# Patient Record
Sex: Female | Born: 1955
Health system: Southern US, Community
[De-identification: ages and names within clinical notes are randomized; demographics above are authoritative.]

## PROBLEM LIST (undated history)

## (undated) DIAGNOSIS — R160 Hepatomegaly, not elsewhere classified: Secondary | ICD-10-CM

## (undated) DIAGNOSIS — Z9889 Other specified postprocedural states: Secondary | ICD-10-CM

## (undated) DIAGNOSIS — R112 Nausea with vomiting, unspecified: Secondary | ICD-10-CM

## (undated) DIAGNOSIS — Z86718 Personal history of other venous thrombosis and embolism: Secondary | ICD-10-CM

## (undated) HISTORY — DX: Personal history of other venous thrombosis and embolism: Z86.718

## (undated) HISTORY — PX: ABDOMINAL HYSTERECTOMY: SHX81

---

## 1998-03-04 ENCOUNTER — Emergency Department (HOSPITAL_COMMUNITY): Admission: EM | Admit: 1998-03-04 | Discharge: 1998-03-04 | Payer: Self-pay | Admitting: Emergency Medicine

## 1998-03-06 ENCOUNTER — Emergency Department (HOSPITAL_COMMUNITY): Admission: EM | Admit: 1998-03-06 | Discharge: 1998-03-06 | Payer: Self-pay | Admitting: Emergency Medicine

## 1999-10-03 ENCOUNTER — Encounter: Admission: RE | Admit: 1999-10-03 | Discharge: 1999-10-03 | Payer: Self-pay | Admitting: Urology

## 1999-10-03 ENCOUNTER — Encounter: Payer: Self-pay | Admitting: Urology

## 2000-07-23 ENCOUNTER — Other Ambulatory Visit: Admission: RE | Admit: 2000-07-23 | Discharge: 2000-07-23 | Payer: Self-pay | Admitting: Obstetrics and Gynecology

## 2004-02-25 ENCOUNTER — Encounter (INDEPENDENT_AMBULATORY_CARE_PROVIDER_SITE_OTHER): Payer: Self-pay | Admitting: Specialist

## 2004-02-25 ENCOUNTER — Inpatient Hospital Stay (HOSPITAL_COMMUNITY): Admission: RE | Admit: 2004-02-25 | Discharge: 2004-02-27 | Payer: Self-pay | Admitting: Obstetrics & Gynecology

## 2005-03-12 ENCOUNTER — Emergency Department (HOSPITAL_COMMUNITY): Admission: EM | Admit: 2005-03-12 | Discharge: 2005-03-13 | Payer: Self-pay | Admitting: Emergency Medicine

## 2005-03-25 ENCOUNTER — Emergency Department (HOSPITAL_COMMUNITY): Admission: EM | Admit: 2005-03-25 | Discharge: 2005-03-25 | Payer: Self-pay | Admitting: Emergency Medicine

## 2005-03-31 ENCOUNTER — Inpatient Hospital Stay (HOSPITAL_COMMUNITY): Admission: AD | Admit: 2005-03-31 | Discharge: 2005-04-05 | Payer: Self-pay | Admitting: Psychiatry

## 2005-03-31 ENCOUNTER — Ambulatory Visit: Payer: Self-pay | Admitting: Psychiatry

## 2005-05-01 ENCOUNTER — Other Ambulatory Visit: Admission: RE | Admit: 2005-05-01 | Discharge: 2005-05-01 | Payer: Self-pay | Admitting: Obstetrics & Gynecology

## 2005-05-21 ENCOUNTER — Inpatient Hospital Stay (HOSPITAL_COMMUNITY): Admission: AD | Admit: 2005-05-21 | Discharge: 2005-05-26 | Payer: Self-pay | Admitting: Psychiatry

## 2005-05-22 ENCOUNTER — Ambulatory Visit: Payer: Self-pay | Admitting: Psychiatry

## 2007-08-17 ENCOUNTER — Ambulatory Visit (HOSPITAL_COMMUNITY): Admission: RE | Admit: 2007-08-17 | Discharge: 2007-08-17 | Payer: Self-pay | Admitting: Urology

## 2008-05-04 ENCOUNTER — Ambulatory Visit (HOSPITAL_COMMUNITY): Admission: RE | Admit: 2008-05-04 | Discharge: 2008-05-04 | Payer: Self-pay | Admitting: Obstetrics & Gynecology

## 2008-08-30 ENCOUNTER — Emergency Department (HOSPITAL_COMMUNITY): Admission: EM | Admit: 2008-08-30 | Discharge: 2008-08-30 | Payer: Self-pay | Admitting: Emergency Medicine

## 2009-02-13 ENCOUNTER — Emergency Department (HOSPITAL_COMMUNITY): Admission: EM | Admit: 2009-02-13 | Discharge: 2009-02-13 | Payer: Self-pay | Admitting: Emergency Medicine

## 2009-02-14 ENCOUNTER — Emergency Department (HOSPITAL_COMMUNITY): Admission: EM | Admit: 2009-02-14 | Discharge: 2009-02-14 | Payer: Self-pay | Admitting: Emergency Medicine

## 2009-02-14 ENCOUNTER — Encounter (INDEPENDENT_AMBULATORY_CARE_PROVIDER_SITE_OTHER): Payer: Self-pay | Admitting: Emergency Medicine

## 2009-02-14 ENCOUNTER — Ambulatory Visit: Payer: Self-pay | Admitting: Vascular Surgery

## 2010-03-16 ENCOUNTER — Emergency Department (HOSPITAL_COMMUNITY): Admission: EM | Admit: 2010-03-16 | Discharge: 2010-03-17 | Payer: Self-pay | Admitting: Emergency Medicine

## 2010-09-18 LAB — CBC
Hemoglobin: 14.5 g/dL (ref 12.0–15.0)
Platelets: 204 10*3/uL (ref 150–400)
RBC: 4.61 MIL/uL (ref 3.87–5.11)
WBC: 7.8 10*3/uL (ref 4.0–10.5)

## 2010-09-18 LAB — DIFFERENTIAL
Basophils Absolute: 0.1 10*3/uL (ref 0.0–0.1)
Eosinophils Absolute: 0.1 10*3/uL (ref 0.0–0.7)
Eosinophils Relative: 1 % (ref 0–5)
Lymphocytes Relative: 33 % (ref 12–46)
Lymphs Abs: 2.6 10*3/uL (ref 0.7–4.0)
Monocytes Absolute: 0.3 10*3/uL (ref 0.1–1.0)

## 2010-09-18 LAB — LIPASE, BLOOD: Lipase: 47 U/L (ref 11–59)

## 2010-09-18 LAB — COMPREHENSIVE METABOLIC PANEL
ALT: 79 U/L — ABNORMAL HIGH (ref 0–35)
AST: 139 U/L — ABNORMAL HIGH (ref 0–37)
Albumin: 4.1 g/dL (ref 3.5–5.2)
CO2: 28 mEq/L (ref 19–32)
Chloride: 105 mEq/L (ref 96–112)
GFR calc Af Amer: >60 mL/min (ref >60)
GFR calc non Af Amer: >60 mL/min (ref >60)
Potassium: 3.3 mEq/L — ABNORMAL LOW (ref 3.5–5.1)
Sodium: 141 mEq/L (ref 135–145)
Total Bilirubin: 0.8 mg/dL (ref 0.3–1.2)

## 2010-09-18 LAB — POCT CARDIAC MARKERS
Myoglobin, poc: 44.2 ng/mL (ref 12–200)
Myoglobin, poc: 48.8 ng/mL (ref 12–200)
Troponin i, poc: <0.05 ng/mL (ref 0.00–0.09)

## 2010-10-12 LAB — POCT I-STAT, CHEM 8
BUN: 14 mg/dL (ref 6–23)
Creatinine, Ser: 0.8 mg/dL (ref 0.4–1.2)
Glucose, Bld: 107 mg/dL — ABNORMAL HIGH (ref 70–99)
Potassium: 3.8 mEq/L (ref 3.5–5.1)
Sodium: 140 mEq/L (ref 135–145)

## 2010-10-12 LAB — PROTIME-INR: Prothrombin Time: 14.1 seconds (ref 11.6–15.2)

## 2010-10-21 LAB — URINE MICROSCOPIC-ADD ON

## 2010-10-21 LAB — URINALYSIS, ROUTINE W REFLEX MICROSCOPIC
Bilirubin Urine: NEGATIVE
Glucose, UA: NEGATIVE mg/dL
Specific Gravity, Urine: 1.02 (ref 1.005–1.030)
pH: 6 (ref 5.0–8.0)

## 2010-10-21 LAB — URINE CULTURE

## 2010-11-18 NOTE — Consult Note (Signed)
NAME:  Cheryl [name], Cheryl [name] NO.:  [id number]   MEDICAL RECORD NO.:  [id number]          PATIENT TYPE:  EMS   LOCATION:  MAJO                         FACILITY:  [facility]   PHYSICIAN:  Renee Ramus, MD       DATE OF BIRTH:  05-28-1956   DATE OF CONSULTATION:  02/14/2009  DATE OF DISCHARGE:  02/14/2009                                 CONSULTATION   HISTORY OF PRESENT ILLNESS:  The patient is a 55 year old female who had  complaints of right lower extremity swelling, seen at [facility] and now returns today for Doppler study.  The patient had  Doppler study, which was positive for right DVT in the common femoral  extending to the popliteal.  The patient does have previous history of  lower extremity clot, asked to see the patient regarding this issue.   PAST MEDICAL HISTORY:  Right lower extremity DVT.   SOCIAL HISTORY:  The patient denies tobacco or alcohol use.   FAMILY HISTORY:  Not available.   REVIEW OF SYSTEMS:  All other comprehensive review of systems are  negative.   ALLERGIES:  The patient is allergic to [facility] and MACROBID.   CURRENT MEDICATIONS:  None.   PHYSICAL EXAMINATION:  GENERAL:  This is a well-developed, well-  nourished female, currently in no apparent distress.  VITAL SIGNS:  Blood pressure 117/82, heart rate 93, respiratory rate 18,  and temperature 98.4.  HEENT:  No jugular venous distention or lymphadenopathy.  Oropharynx is  clear.  Mucous membranes pink and moist.  TMs clear bilaterally.  Pupils  equal, reactive to light and accommodation.  Extraocular muscles are  intact.  CARDIOVASCULAR:  Regular rate and rhythm without murmurs, rubs, or  gallops.  PULMONARY:  Lungs are clear to auscultation bilaterally.  ABDOMEN:  Soft, nontender, nondistended without hepatosplenomegaly.  Bowel sounds are present.  She has no rebound or guarding.  EXTREMITIES:  She has a swollen right lower extremity with good distal  pulses.  NEUROLOGIC:   Cranial nerves II through XII are grossly intact.  She has  no focal neurological deficits.   LABORATORY DATA:  INR is 1.1.  Currently, she has hemoglobin of 11.6,  hematocrit 34, sodium 140, potassium 3.8, chloride 1.6, bicarb 23, BUN  14, creatinine 0.8, glucose 107.   STUDIES:  Ultrasound shows right DVT.   ASSESSMENT AND PLAN:  Right lower extremity deep venous thrombosis.  We  start anticoagulation with Lovenox and Coumadin.  The patient has an  appointment on Monday August 16 to have a PT/INR check for obtaining a  compassionate relief for these medications.  The patient will follow up  with primary care physician as needed.   Consult note was constructed by reviewing past medical  history  conferring with emergency medical room physician and reviewing the  emergency medical record.   TIME SPENT:  One hour.      Renee Ramus, MD  Electronically Signed     JF/MEDQ  D:  02/14/2009  T:  02/14/2009  Job:  [id number]   cc:   Soyla Murphy. Renne Crigler,  M.D. 

## 2010-11-21 NOTE — Discharge Summary (Signed)
Cheryl [name], FUNDERBURKE NO.:  [id number]   MEDICAL RECORD NO.:  [id number]          PATIENT TYPE:  IPS   LOCATION:  0405                          [facility]ILITY[facility]   PHYSICIAN:  Jeanice Lim, M.D. DATE OF BIRTH:  1955/07/10   DATE OF ADMISSION:  03/31/2005  DATE OF DISCHARGE:  04/05/2005                                 DISCHARGE SUMMARY   IDENTIFYING DATA:  This is a 55 year old Caucasian female, involuntarily  admitted, referred by [facility] after husband petitioned  on patient not eating or drinking for 3 days, manic.  Went to mental health  center, took out a petition on wife feeling she is unsafe due to self  neglect.  The patient not eating or sleeping, called 911 each night for the  past 3 nights, and was driving around, picked up a stranger in her car,  drinking alcohol, disheveled and very poor hygiene with stool on her  clothing and matted hair.  Past history of clear bipolar disorder, treated  by Dr. Evelene Croon about 6 years ago, saw her 2003.  The patient reports mood  episode worsened after mother's death.  Recently started trying to see Dr.  Evelene Croon probably upon husband's request and did not fully understand why she  needs to be here and has some paranoid thinking and grandiose delusions  regarding business and husband's intent.  Does trust daughter although  reports that she also does not understand how sick the father is.  Mother  has bipolar and alcohol, aunt has bipolar and possible history of  schizophrenia.  Alcohol dependence runs in the family.  No primary care  physician.  Medications:  Not taking any and again alcohol use though the  patient minimizing this but likely drinking every night, with a recent  increase in use since mood instability according to husband, has had more  significant alcohol problems, longstanding, but the patient's alcohol  problem likely gets worse when her mood is unstable, attempting to self   medicate.   PHYSICAL AND NEUROLOGICAL EXAMINATION:  Within normal limits.   ROUTINE ADMISSION LABS:  Within normal limits.   MENTAL STATUS EXAM:  Fully alert, bright affect, quick movements,  cooperative but impulsive, needs redirection, rapid, hyperverbal, rambling  at times, difficult to understand.  Mood elevated, minimizing alcohol use,  positive flight of ideas, distracted.  Believes husband is inattentive and  may harm her and lock her in jail.  Tangential at times, almost threatening  due to mood elevation, agitation, and wanting what she wants immediately.  Cognitively intact but unreliable historian with impaired recollection of  events and impaired concentration and attention and recent memory.   ADMISSION DIAGNOSES:  AXIS I:  Bipolar disorder I, manic, with psychotic  features, recurrent, severe and rule out alcohol dependence, alcohol abuse  at least.  AXIS II:  None.  AXIS III:  Hypokalemia and elevated blood pressure, rule out hypertension.  AXIS IV:  Moderate to severe stressors with marital conflict and likely  longstanding bipolar disorder not fully treated and recent losses.  Also  possible inheritance which  may have given money to fuel mood instability.  AXIS V:  28/60-65.   HOSPITAL COURSE:  The patient was admitted involuntarily, monitored for  safety regarding suicidalality, transferred to Dr. Broadus John services.  Had  been recently seen by Dr. Evelene Croon.  Dr. Evelene Croon was contacted for treatment plan  and further information and family after the patient was agreeable and  signed release contacted for collateral information.  The patient was  monitored for safety, optimized on medications, educated regarding severity  of her bipolar disorder and as she improved with a few nights of sleep.  Librium detox protocol was stopped.  The patient reported significant  improvement in judgment and insight, no longer thought the husband was  against her and was willing to have a  family meeting, and was focused on  being compliant, remaining alcohol free and getting herself healthy.  The  patient was discharged home to family, had a good support system, no  dangerous ideation or mood instability or psychotic symptoms, at time of  discharge was sleeping and eating well and had no problem with ADLs or  ability to care for self, was given medication education, again the  importance of long-term compliance due to the severity of her mood disorder  in addition to abstaining from alcohol, seeking bipolar support group, AA,  therapy and medication monitoring were all recommended.  The patient was  given medication education and discharged on:  1.  Ambien 10 mg q.h.s.  2.  Depakote ER 250 mg q.a.m. and 3 q.8 p.m.  3.  Risperdal 0.5 mg q.8 p.m.   DISPOSITION:  The patient was to follow up Friday, October 6 and Wednesday  October 4 at 10:15.   DISCHARGE DIAGNOSES:  AXIS I:  Bipolar disorder I, manic, with psychotic  features, recurrent, severe and rule out alcohol dependence, alcohol abuse  at least.  AXIS II:  None.  AXIS III:  Hypokalemia and elevated blood pressure, rule out hypertension.  AXIS IV:  Moderate to severe stressors with marital conflict and likely  longstanding bipolar disorder not fully treated and recent losses.  Also  possible inheritance which may have given money to fuel mood instability.  AXIS V:  Global assessment of function on discharge was 55.      Jeanice Lim, M.D.  Electronically Signed     JEM/MEDQ  D:  05/06/2005  T:  05/07/2005  Job:  [id number]

## 2010-11-21 NOTE — Op Note (Signed)
NAME:  Cheryl [name], Cheryl [name]                          ACCOUNT NO.:  [id number]   MEDICAL RECORD NO.:  [id number]                   PATIENT TYPE:  INP   LOCATION:  9399                                 FACILITY:  [facility]   PHYSICIAN:  Freddy Finner, M.D.                DATE OF BIRTH:  12-18-1955   DATE OF PROCEDURE:  02/25/2004  DATE OF DISCHARGE:                                 OPERATIVE REPORT   PREOPERATIVE DIAGNOSES:  1. Uterine fibroids.  2. Small lateral ovarian cyst.   POSTOPERATIVE DIAGNOSES:  1. Uterine fibroids.  2. Small lateral ovarian cyst.   PROCEDURE:  Total abdominal hysterectomy with bilateral salpingo-  oophorectomy.  Uterine size was 470 g.   SURGEON:  Freddy Finner, M.D.   ASSISTANT:  Stann Mainland. Vincente Poli, M.D.   ANESTHESIA:  General endotracheal anesthesia.   ESTIMATED BLOOD LOSS:  200 mL.   COMPLICATIONS:  None.   DESCRIPTION OF PROCEDURE:  The patient was admitted on the morning of  surgery.  She was given subcutaneous heparin because of an episode of  phlebitis following previous surgical procedure.  She was given IV  antibiotics.  She was placed in PAS hose.  She was brought to the operating  room, placed under adequate general anesthesia, and placed in the dorsal  recumbent position.  Abdomen, perineum and vagina were prepped and draped in  the usual fashion with Betadine scrub followed by Betadine solution.  Foley  catheter was placed using sterile technique.  Sterile drapes were applied.  A low abdominal transverse incision was made through a low transverse scar  and carried sharply down to the fascia.  Fascia was extended sharply in the  extended to extend the skin incision.  Bleeding subcutaneous and subfascial  vessels were controlled with Bovie.  Rectus sheath was developed superiorly  and inferiorly, blunt and sharp dissection.  Rectus muscles were divided in  the midline.  Peritoneum was elevated, entered sharply and extended bluntly  to extend the  skin incision.  Exploration of the upper abdomen revealed no  palpable abnormalities of liver, gallbladder or kidneys. Self-retaining  O'Connor-O'Sullivan retractor was placed.  Moist packs were used to pack the  entire stomach contents out of the pelvis.  Proximal broad ligaments were  grasped with Kelleys.  Using the LigaSure system, the infundibulopelvic  ligament on the right was sealed and divided.  Round ligament was sealed and  divided.  Upper broad ligament was sealed and divided.  Left side was then  treated in a similar fashion.  There were a few filmy adhesions holding the  fallopian tube to the peritoneal surface that were lysed and released.  Bladder was released with a transverse incision and the visceral peritoneum  overlying the lower uterine segment and anterior broad ligament.  Bladder  was carefully dissected off the bladder.  Uterine artery pedicles were taken  with the LigaSure, sealed and  divided.  Cardinal ligament pedicles were  taken with LigaSure, sealed and divided.  Uterosacral ligaments were taken  with curved Heaneys divided sharply and ligated with suture ligature of 0  Monocryl.  This included the angle of the vagina on each side.  Small  bleeding source near the left uterosacral was controlled with a figure-of-  eight of 0 Monocryl.  The cervix was excised from the cuff.  Cuff was closed  with figure-of-eights of 0 Monocryl.  Irrigation was carried out.  Hemostasis was complete.  Uterosacral ligaments were plicated.  Peritoneum  anterior of the bladder flap was controlled with a single interrupted 0  Monocryl suture.  Pack, needle and instrument counts were correct.  Appendix  was visualized and found to be retrocecal but very small and grossly normal  in appearance and was left in place.  Abdominal incision was then closed in  layers.  Running 0 Monocryl was used to close peritoneum and reapproximate  rectus muscles.  Fascia was closed with running 0 PDS.   Skin was closed with  wide skin staples and covered with Steri-Strips.  The patient was awakened  and taken to the recovery room in good condition.                                               Freddy Finner, M.D.    WRN/MEDQ  D:  02/25/2004  T:  02/25/2004  Job:  [id number]

## 2010-11-21 NOTE — H&P (Signed)
NAME:  Cheryl [name], Cheryl [name]                          ACCOUNT NO.:  [id number]   MEDICAL RECORD NO.:  [id number]                   PATIENT TYPE:  INP   LOCATION:  NA                                   [facility]ILITY[facility]  [facility]   PHYSICIAN:  Freddy Finner, M.D.                DATE OF BIRTH:  04-Feb-1956   DATE OF ADMISSION:  DATE OF DISCHARGE:                                HISTORY & PHYSICAL   PREOPERATIVE DIAGNOSES:  1. Large uterine fibroids.  2. Bilateral ovarian cyst.   HISTORY AND PHYSICAL:  The patient is a 55 year old white married female  with one living child delivered in 53 by cesarean.  Postoperatively, she  had phlebitis.  She presented to the office in March of this year with a  history of having had a urological evaluation and had a finding of very  large uterine fibroids.  Examination in my office revealed the uterus to be  12-14 weeks' gestational size and irregularly nodular.  Subsequent pelvic  ultrasound was performed with the finding of at least 4-5 fibroids, the  largest of which was approximately 6.5 cm and bilateral simple ovarian cyst.  After careful consultation, the patient has elected to proceed with  definitive surgery, specifically total abdominal hysterectomy.  Because of  her age and the presence of an ovarian cyst, she will also have bilateral  salpingo-oophorectomy.  She has reviewed the video in the office describing  the operative procedure including the potential risks of the procedure.   REVIEW OF SYSTEMS:  Her current review of systems is otherwise negative.  She has no cardiopulmonary, GI or GU complaints.   PAST MEDICAL HISTORY:  She has no known significant medical illnesses except  the episode of phlebitis following cesarean.  She has never had a blood  transfusion.  She had a history of a normal Pap smear in July 2004.   MEDICATIONS:  She is currently on no medications on a regular basis.   ALLERGIES:  She is allergic to sulfa drugs and Macrodantin.   .   SOCIAL HISTORY:  She does not use cigarettes.   FAMILY HISTORY:  Noncontributory.   PHYSICAL EXAMINATION:  VITAL SIGNS:  Blood pressure in the office was  120/82, weight 143.  HEENT:  Grossly within normal limits.  NECK:  There is no palpable enlargement of the thyroid.  CHEST:  Clear to auscultation throughout.  HEART:  Normal sinus rhythm without murmurs, rubs or gallops.  ABDOMEN:  Soft and nontender.  There is a palpable mass just above the  symphysis.  There is no other appreciable organomegaly or palpable masses.  EXTREMITIES:  Without cyanosis, clubbing or edema.   ASSESSMENT:  1. Large uterine leiomyomata.  2. Bilateral ovarian cysts.   PLAN:  Total abdominal hysterectomy, bilateral salpingo-oophorectomy.  The  patient will be given subcutaneous heparin as prophylaxis because of her  history of phlebitis.  She will  be treated preoperatively with antibiotic.  Hormone replacement therapy will with a skin patch because of the history of  phlebitis.                                               Freddy Finner, M.D.    WRN/MEDQ  D:  02/24/2004  T:  02/24/2004  Job:  [id number]

## 2010-11-21 NOTE — Discharge Summary (Signed)
[name], [name] NO.:  [id number]   MEDICAL RECORD NO.:  [id number]          PATIENT TYPE:  IPS   LOCATION:  0406                          [facility]   PHYSICIAN:  Cheryl Jungling, MD  DATE OF BIRTH:  28-Sep-1955   DATE OF ADMISSION:  05/21/2005  DATE OF DISCHARGE:  05/26/2005                                 DISCHARGE SUMMARY   IDENTIFYING DATA AND REASON FOR ADMISSION:  The patient is a 55 year old  married white female who was admitted on an involuntary basis due to  increasing agitation, noncompliance with her medication regimen, excessive  alcohol consumption that resulted in severe behavioral disorganization,  walking the streets. Her mood and bearing was also noted to be hostile and  aggressive. She came to [place] as a patient of Dr. Evelene [name], her outpatient  psychiatrist. She had previously been here in our inpatient service in  September of 2006. Her usual medication regimen had been Depakote 500  milligrams q.h.s. and Zoloft 50 milligrams daily. Please refer to the  admission note for further details pertaining to the symptoms, circumstances  and history that led to her hospitalization. She was given an initial Axis I  diagnosis of bipolar disorder NOS.   MEDICAL AND LABORATORY:  The patient came to [place] with a history of  hypertension, and allergy to sulfa, but otherwise in good health. She was  physically and medically assessed by the nurse practitioner upon the  beginning of her treatment.   HOSPITAL COURSE:  The patient was admitted to the adult inpatient service  where she initially was quite agitated, irritable, and verbally aggressive  towards staff. She was given Haldol 10 milligrams IM and Ativan 2 milligrams  IM with very good results. She was started on a regimen of Depakote 500  milligrams q.h.s., Zoloft 50 milligrams q.h.s., Risperdal 0.5 milligrams q.  8 p.m., and to address possible alcohol cessation symptoms, Librium 50  milligrams p.o.  q.h.s. followed by Librium 25 milligrams p.o. q.6 h p.r.n.  signs and symptoms of withdrawal.   By the second hospital day the patient was much more calm, relaxed, and  subdued. She understood where she was and the reasons for treatment. Her  bedtime dose of Risperdal was increased to 2 milligrams.   On the second hospital day she was discovered to have relatively low serum  potassium, so 40 mEq of potassium was given immediately, followed by 40 mEq  daily x3. She was also encouraged to drink Gatorade.   The patient continued to stabilize with respect to mood, and thought  organization.   There were several attempts to schedule a family meeting involving the  patient's husband, but he was not reachable by telephone despite numerous  attempts by her case manager to get a hold of him.   On the sixth hospital day, the patient was calm, pleasant, cooperative, and  absent any signs or symptoms of alcohol withdrawal. She was felt to be ready  for discharge.   AFTERCARE:  The patient was to follow-up with Dr. Evelene [name] on 06/06/2005, and  with her therapist,  Cheryl [name] on 05/27/2005.   DISCHARGE MEDICATIONS:  Zoloft 50 milligrams daily, Risperdal 2 milligrams  q.h.s., and Depakote 750 milligrams q.h.s..   DISCHARGE DIAGNOSES:  AXIS I: Bipolar disorder, most recently manic, history  of alcohol abuse, dependence, early remission.  AXIS II: Deferred.  AXIS III: Allergy to sulfa, and hypertension.  AXIS IV: Stressors severe.  AXIS V: GAF on discharge 65.   It is anticipated that Dr. Evelene [name] and Cheryl [name] will  address the patient's  alcohol abuse and dependence issues further an outpatient treatment and  perhaps make appropriate referrals to the chemical dependency program.           ______________________________  Cheryl Jungling, MD  Electronically Signed     SPB/MEDQ  D:  05/26/2005  T:  05/26/2005  Job:  [id number]

## 2010-11-21 NOTE — Discharge Summary (Signed)
NAME:  Cheryl [name], Cheryl [name]                          ACCOUNT NO.:  [id number]   MEDICAL RECORD NO.:  [id number]                   PATIENT TYPE:  INP   LOCATION:  9310                                 [facility]ILITY[facility]   PHYSICIAN:  Freddy Finner, M.D.                DATE OF BIRTH:  1955/11/18   DATE OF ADMISSION:  02/25/2004  DATE OF DISCHARGE:  02/27/2004                                 DISCHARGE SUMMARY   DISCHARGE DIAGNOSES:  1.  Large uterine leiomyomata with uterine weight of 418 g.  2.  Benign follicular ovarian cyst.   SECONDARY DIAGNOSIS:  History of phlebitis following surgical delivery in  1985.   OPERATIVE PROCEDURE:  Total abdominal hysterectomy, bilateral salpingo-  oophorectomy.   INTRAOPERATIVE AND POSTOPERATIVE COMPLICATIONS:  None.   DISPOSITION:  The patient is in satisfactory and improved condition at the  time of her discharge on postoperative day #2.  The abdominal incision was  clear.  There was scant vaginal drainage.  She is ambulating without  difficulty, having adequate bowel and bladder function.  She is discharged  home with routine outpatient surgical instructions including those to call  for a fever or heavy bleeding.  She is to have progressive increasing  activity but no vaginal entry.  She is to take Percocet 5/325 for  postoperative pain which can be mixed with ibuprofen.  She is to use  transdermal estrogen replacement.   Details of the present illness, past history, family history, review of  systems, and physical exam were recorded in the admission note.  Her  physical findings were remarkable for a very large uterus measuring  approximately 12- to 14-weeks gestational size and irregularly nodular.   Her past history is most remarkable for an episode of phlebitis.   Laboratory data during this admission includes a CBC on admission with  hemoglobin of 15.7.  Postoperatively her hemoglobin was 13.2 on  postoperative day #1 and 13.1 on  postoperative  day #2.  Her admission  prothrombin time and PTT were normal.  Urinalysis was unremarkable.   HOSPITAL COURSE:  Following admission the patient was taken to the operating  room where the above-described surgical procedure was accomplished.  She was  treated perioperatively with subcutaneous heparin q.8h.  She was given an IV  antibiotic preoperatively.  She was placed in compression hose.  Her  postoperative course was uncomplicated.  She remained afebrile throughout.  Her heparin was continued for a period of approximately 36 hours after  surgery until she was ambulating normally.  By the time of her discharge she  was considered to be in satisfactory condition.  Further disposition as  noted above.  Freddy Finner, M.D.    WRN/MEDQ  D:  03/16/2004  T:  03/17/2004  Job:  [id number]

## 2012-07-22 ENCOUNTER — Other Ambulatory Visit: Payer: Self-pay | Admitting: Dermatology

## 2015-04-23 ENCOUNTER — Other Ambulatory Visit: Payer: Self-pay | Admitting: Dermatology

## 2015-04-23 ENCOUNTER — Ambulatory Visit
Admission: RE | Admit: 2015-04-23 | Discharge: 2015-04-23 | Disposition: A | Discharge status: Home / Self Care | Payer: Self-pay | Source: Ambulatory Visit | Attending: Dermatology | Admitting: Dermatology

## 2015-04-23 DIAGNOSIS — L602 Onychogryphosis: Secondary | ICD-10-CM

## 2015-08-27 ENCOUNTER — Ambulatory Visit (INDEPENDENT_AMBULATORY_CARE_PROVIDER_SITE_OTHER): Payer: BLUE CROSS/BLUE SHIELD | Admitting: Podiatry

## 2015-08-27 ENCOUNTER — Encounter: Payer: Self-pay | Admitting: Podiatry

## 2015-08-27 VITALS — BP 121/83 | HR 75 | Resp 16 | Ht 68.0 in | Wt 150.0 lb

## 2015-08-27 DIAGNOSIS — L603 Nail dystrophy: Secondary | ICD-10-CM

## 2015-08-27 NOTE — Progress Notes (Signed)
   Subjective:    Patient ID: Cheryl [name], female    DOB: [birth year], 60 y.o.   MRN: [id number]  HPI: She presents today with a chief complaint of a thick discolored painful hallux nail left. She states that this is only the nail that bothers her. She states that she has seen her dermatologist on several occasions who has tested the toenail and determined the infection to be yeast. She has taken Lamisil in the past and most recently Diflucan over the past couple of years. She states that the toenail barely grows and all the rest of them grow vigorously. She would like to know if there is anything she can do for the toenail.    Review of Systems  All other systems reviewed and are negative.      Objective:   Physical Exam: Vital signs are stable she is alert and oriented 61. 60 year old white female in no apparent distress. I have reviewed her past medical history medications allergies surgeries social history and review of systems I have also reviewed her paperwork as well as notes sent from [facility] dermatology. At this point pulses are strongly palpable neurologic sensorium is intact deep tendon reflexes are intact and muscle strength is 5 over 5 dorsiflexion plantar flexors and inverters everters all intrinsic musculature is intact. Orthopedic evaluation and stress all joints distal to the ankle for range of motion without crepitation. Cutaneous evaluation of straight supple well-hydrated cutis no erythema edema cellulitis drainage or odor. She has a very thick dystrophic nail it appears to grow straight up from the lunula and then straight back down with curvature plantarly and laterally. The entire nail appears to be thick and brittle with some subungual debris it appears that there may be fungus or yeast present but this is just definitely a dystrophic nail.      Assessment & Plan:  Assessment: Dystrophic nail hallux left.  Plan: I provided her with 3 options: #1 to do nothing. #2  perform a total nail avulsion and allow it to grow back. And #3 a total nail avulsion with matrixectomy to prevent it from growing back. I explained to her that she would have to undergo local anesthesia to the toe and I also explained how long she would have to soak the toe in care for it. She states that she would like to make an appointment to follow-up with me in a week or so after she has time to think about it and we will do it at that time. I will follow-up with her in the near future

## 2015-09-16 ENCOUNTER — Telehealth: Payer: Self-pay | Admitting: *Deleted

## 2015-09-16 NOTE — Telephone Encounter (Signed)
Pt states she is scheduled 09/19/2015 to have a toenail removed, and was wondering if she could wear a shoe and go to work afterward.  I told pt she should wear either a loose shoe or open-toed shoes after the procedure and she may go back to work.

## 2015-09-19 ENCOUNTER — Encounter: Payer: Self-pay | Admitting: Podiatry

## 2015-09-19 ENCOUNTER — Ambulatory Visit (INDEPENDENT_AMBULATORY_CARE_PROVIDER_SITE_OTHER): Payer: BLUE CROSS/BLUE SHIELD | Admitting: Podiatry

## 2015-09-19 VITALS — BP 157/98 | HR 72 | Resp 12

## 2015-09-19 DIAGNOSIS — L603 Nail dystrophy: Secondary | ICD-10-CM | POA: Diagnosis not present

## 2015-09-19 DIAGNOSIS — L600 Ingrowing nail: Secondary | ICD-10-CM | POA: Diagnosis not present

## 2015-09-19 MED ORDER — NEOMYCIN-POLYMYXIN-HC 3.5-10000-1 OT SOLN: OTIC | Status: DC

## 2015-09-19 NOTE — Patient Instructions (Signed)

## 2015-09-19 NOTE — Progress Notes (Signed)
She presents today to have her toenail removed hallux left. We talked about this last time and she would like to have it just avulsed but not permanently removed.  Objective: Vital signs are stable she is alert and oriented 3 pulses are palpable. I reviewed her medications and allergies. Discussed this with her. Her toenail is thick yellow dystrophic painful palpation.  Assessment: Nail dystrophy painful hallux left.  Plan: Total nail avulsion hallux left after local anesthesia was administered. We did not apply phenol. We did apply Surgicel to help prevent bleeding since she is on Coumadin and her INR is relatively low. She was given both oral and ongoing instructions for care and soaking of her toe I will follow up with her in 1 week.

## 2015-09-20 ENCOUNTER — Telehealth: Payer: Self-pay | Admitting: *Deleted

## 2015-09-20 NOTE — Telephone Encounter (Signed)
Pt's husband, Konrad Dolores states he needed to know how long pt needed to soak her foot after her ingrown procedure.  I told Tommy that pt would need to soak 2 x daily for at least 2 weeks, then could soak daily and continue to cover with the antibiotic ointment bandaid, and could allow to air dry in the evening when not in enclosed shoes or walking around, but to continue soaks and dressings until the area gets a dry hard scab without redness, drainage or swelling.  Konrad Dolores states understanding and will tell his wife.

## 2015-09-26 ENCOUNTER — Encounter: Payer: Self-pay | Admitting: Podiatry

## 2015-09-26 ENCOUNTER — Ambulatory Visit (INDEPENDENT_AMBULATORY_CARE_PROVIDER_SITE_OTHER): Payer: BLUE CROSS/BLUE SHIELD | Admitting: Podiatry

## 2015-09-26 DIAGNOSIS — L600 Ingrowing nail: Secondary | ICD-10-CM

## 2015-09-27 NOTE — Progress Notes (Signed)
She presents today 1 week status post matrixectomy hallux left. She states that "I think it is good". She continues to soak in Betadine and water.  Objective: Vital signs are stable alert and oriented 3. No erythema edema cellulitis drainage or odor. Toe appears to be healing well. No signs of infection.  Assessment: Well-healing surgical foot.  Plan: Discontinue Betadine start with Epsom salts and warm water soaks covered in the day and leave open at bedtime. Continue soaks until completely resolved. Should she have questions or concerns she will notify immediately.

## 2015-10-11 DIAGNOSIS — L638 Other alopecia areata: Secondary | ICD-10-CM | POA: Diagnosis not present

## 2015-11-12 DIAGNOSIS — I82409 Acute embolism and thrombosis of unspecified deep veins of unspecified lower extremity: Secondary | ICD-10-CM | POA: Diagnosis not present

## 2015-11-12 DIAGNOSIS — Z7901 Long term (current) use of anticoagulants: Secondary | ICD-10-CM | POA: Diagnosis not present

## 2015-11-26 ENCOUNTER — Telehealth: Payer: Self-pay | Admitting: *Deleted

## 2015-11-26 NOTE — Telephone Encounter (Signed)
Pt states she had a toenail removed by Dr. Milinda Pointer 09/2015 and had questions. Pt states the area where the toenail was is black and bluish and bumpy.  I told the pt that sounded like scabbing, and if there was no redness, swelling or drainage, that scabbing should slowly slough off during day to day bathing.

## 2015-12-26 DIAGNOSIS — Z7901 Long term (current) use of anticoagulants: Secondary | ICD-10-CM | POA: Diagnosis not present

## 2015-12-26 DIAGNOSIS — Z86718 Personal history of other venous thrombosis and embolism: Secondary | ICD-10-CM | POA: Diagnosis not present

## 2016-01-21 DIAGNOSIS — H60399 Other infective otitis externa, unspecified ear: Secondary | ICD-10-CM | POA: Diagnosis not present

## 2016-01-21 DIAGNOSIS — Z6822 Body mass index (BMI) 22.0-22.9, adult: Secondary | ICD-10-CM | POA: Diagnosis not present

## 2016-01-21 DIAGNOSIS — Z01419 Encounter for gynecological examination (general) (routine) without abnormal findings: Secondary | ICD-10-CM | POA: Diagnosis not present

## 2016-02-04 DIAGNOSIS — Z7901 Long term (current) use of anticoagulants: Secondary | ICD-10-CM | POA: Diagnosis not present

## 2016-02-04 DIAGNOSIS — Z86718 Personal history of other venous thrombosis and embolism: Secondary | ICD-10-CM | POA: Diagnosis not present

## 2016-03-04 DIAGNOSIS — L638 Other alopecia areata: Secondary | ICD-10-CM | POA: Diagnosis not present

## 2016-03-26 DIAGNOSIS — I82409 Acute embolism and thrombosis of unspecified deep veins of unspecified lower extremity: Secondary | ICD-10-CM | POA: Diagnosis not present

## 2016-03-26 DIAGNOSIS — Z7901 Long term (current) use of anticoagulants: Secondary | ICD-10-CM | POA: Diagnosis not present

## 2016-05-05 DIAGNOSIS — Z23 Encounter for immunization: Secondary | ICD-10-CM | POA: Diagnosis not present

## 2016-05-14 DIAGNOSIS — Z7901 Long term (current) use of anticoagulants: Secondary | ICD-10-CM | POA: Diagnosis not present

## 2016-05-14 DIAGNOSIS — Z86718 Personal history of other venous thrombosis and embolism: Secondary | ICD-10-CM | POA: Diagnosis not present

## 2016-05-19 ENCOUNTER — Encounter: Payer: Self-pay | Admitting: Podiatry

## 2016-05-19 ENCOUNTER — Ambulatory Visit (INDEPENDENT_AMBULATORY_CARE_PROVIDER_SITE_OTHER): Payer: BLUE CROSS/BLUE SHIELD | Admitting: Podiatry

## 2016-05-19 VITALS — BP 143/101 | HR 72 | Resp 16

## 2016-05-19 DIAGNOSIS — L603 Nail dystrophy: Secondary | ICD-10-CM | POA: Diagnosis not present

## 2016-05-19 NOTE — Progress Notes (Signed)
She presents today for questions regarding the hallux left. She states that back in 8 equal we removed the nail temporarily just to see if it would grow back normally. At that time I had expressed to her that more than likely would grow back exactly the same way as it was. She is questioning the ridges that are forming in the nail as it grows out at this point.  Objective: Vital signs are stable she is alert and oriented 3. Pulses are palpable. The nail plate hallux left does demonstrate a lower level of nail which is attached to the bed however nail dystrophy proximally is obvious with deformity of the nail. I see no signs of fungal infection or bacterial infection.  Assessment: Nail dystrophy hallux left.  Plan: Follow up with her with the nail has grown out for permanent nail removal.

## 2016-05-26 DIAGNOSIS — E559 Vitamin D deficiency, unspecified: Secondary | ICD-10-CM | POA: Diagnosis not present

## 2016-05-26 DIAGNOSIS — Z0000 Encounter for general adult medical examination without abnormal findings: Secondary | ICD-10-CM | POA: Diagnosis not present

## 2016-05-26 DIAGNOSIS — N390 Urinary tract infection, site not specified: Secondary | ICD-10-CM | POA: Diagnosis not present

## 2016-06-02 DIAGNOSIS — E559 Vitamin D deficiency, unspecified: Secondary | ICD-10-CM | POA: Diagnosis not present

## 2016-06-02 DIAGNOSIS — R319 Hematuria, unspecified: Secondary | ICD-10-CM | POA: Diagnosis not present

## 2016-06-02 DIAGNOSIS — Z0000 Encounter for general adult medical examination without abnormal findings: Secondary | ICD-10-CM | POA: Diagnosis not present

## 2016-06-25 DIAGNOSIS — Z86718 Personal history of other venous thrombosis and embolism: Secondary | ICD-10-CM | POA: Diagnosis not present

## 2016-06-25 DIAGNOSIS — Z7901 Long term (current) use of anticoagulants: Secondary | ICD-10-CM | POA: Diagnosis not present

## 2016-08-18 DIAGNOSIS — Z86718 Personal history of other venous thrombosis and embolism: Secondary | ICD-10-CM | POA: Diagnosis not present

## 2016-08-18 DIAGNOSIS — Z7901 Long term (current) use of anticoagulants: Secondary | ICD-10-CM | POA: Diagnosis not present

## 2016-09-21 ENCOUNTER — Encounter: Payer: Self-pay | Admitting: Internal Medicine

## 2016-09-29 DIAGNOSIS — Z86718 Personal history of other venous thrombosis and embolism: Secondary | ICD-10-CM | POA: Diagnosis not present

## 2016-09-29 DIAGNOSIS — Z7901 Long term (current) use of anticoagulants: Secondary | ICD-10-CM | POA: Diagnosis not present

## 2016-09-30 DIAGNOSIS — F329 Major depressive disorder, single episode, unspecified: Secondary | ICD-10-CM | POA: Diagnosis not present

## 2016-10-13 DIAGNOSIS — Z7901 Long term (current) use of anticoagulants: Secondary | ICD-10-CM | POA: Diagnosis not present

## 2016-10-13 DIAGNOSIS — Z86718 Personal history of other venous thrombosis and embolism: Secondary | ICD-10-CM | POA: Diagnosis not present

## 2016-11-05 ENCOUNTER — Other Ambulatory Visit (HOSPITAL_COMMUNITY): Payer: Self-pay | Admitting: Internal Medicine

## 2016-11-05 DIAGNOSIS — R748 Abnormal levels of other serum enzymes: Secondary | ICD-10-CM

## 2016-11-05 DIAGNOSIS — R109 Unspecified abdominal pain: Secondary | ICD-10-CM

## 2016-11-05 DIAGNOSIS — I10 Essential (primary) hypertension: Secondary | ICD-10-CM | POA: Diagnosis not present

## 2016-11-05 DIAGNOSIS — F329 Major depressive disorder, single episode, unspecified: Secondary | ICD-10-CM | POA: Diagnosis not present

## 2016-11-06 ENCOUNTER — Ambulatory Visit (HOSPITAL_COMMUNITY): Payer: Self-pay

## 2016-11-11 ENCOUNTER — Encounter: Payer: Self-pay | Admitting: Gastroenterology

## 2016-11-12 ENCOUNTER — Ambulatory Visit (HOSPITAL_COMMUNITY): Payer: BLUE CROSS/BLUE SHIELD

## 2016-11-12 DIAGNOSIS — R748 Abnormal levels of other serum enzymes: Secondary | ICD-10-CM | POA: Diagnosis not present

## 2016-11-18 DIAGNOSIS — N390 Urinary tract infection, site not specified: Secondary | ICD-10-CM | POA: Diagnosis not present

## 2016-11-18 DIAGNOSIS — R7989 Other specified abnormal findings of blood chemistry: Secondary | ICD-10-CM | POA: Diagnosis not present

## 2016-11-18 DIAGNOSIS — F329 Major depressive disorder, single episode, unspecified: Secondary | ICD-10-CM | POA: Diagnosis not present

## 2016-11-18 DIAGNOSIS — K219 Gastro-esophageal reflux disease without esophagitis: Secondary | ICD-10-CM | POA: Diagnosis not present

## 2016-11-18 DIAGNOSIS — R748 Abnormal levels of other serum enzymes: Secondary | ICD-10-CM | POA: Diagnosis not present

## 2016-11-18 DIAGNOSIS — R809 Proteinuria, unspecified: Secondary | ICD-10-CM | POA: Diagnosis not present

## 2016-11-24 DIAGNOSIS — B159 Hepatitis A without hepatic coma: Secondary | ICD-10-CM | POA: Diagnosis not present

## 2016-11-24 DIAGNOSIS — Z86718 Personal history of other venous thrombosis and embolism: Secondary | ICD-10-CM | POA: Diagnosis not present

## 2016-11-24 DIAGNOSIS — Z7901 Long term (current) use of anticoagulants: Secondary | ICD-10-CM | POA: Diagnosis not present

## 2016-12-01 ENCOUNTER — Encounter: Payer: Self-pay | Admitting: Nurse Practitioner

## 2016-12-01 ENCOUNTER — Telehealth: Payer: Self-pay

## 2016-12-01 ENCOUNTER — Ambulatory Visit (INDEPENDENT_AMBULATORY_CARE_PROVIDER_SITE_OTHER): Payer: BLUE CROSS/BLUE SHIELD | Admitting: Nurse Practitioner

## 2016-12-01 ENCOUNTER — Other Ambulatory Visit (INDEPENDENT_AMBULATORY_CARE_PROVIDER_SITE_OTHER): Payer: BLUE CROSS/BLUE SHIELD

## 2016-12-01 ENCOUNTER — Encounter (INDEPENDENT_AMBULATORY_CARE_PROVIDER_SITE_OTHER): Payer: Self-pay

## 2016-12-01 VITALS — BP 110/90 | HR 72 | Ht 68.0 in | Wt 130.2 lb

## 2016-12-01 DIAGNOSIS — R7989 Other specified abnormal findings of blood chemistry: Secondary | ICD-10-CM

## 2016-12-01 DIAGNOSIS — Z1211 Encounter for screening for malignant neoplasm of colon: Secondary | ICD-10-CM

## 2016-12-01 DIAGNOSIS — R945 Abnormal results of liver function studies: Principal | ICD-10-CM

## 2016-12-01 DIAGNOSIS — K8020 Calculus of gallbladder without cholecystitis without obstruction: Secondary | ICD-10-CM | POA: Diagnosis not present

## 2016-12-01 LAB — HEPATIC FUNCTION PANEL
ALBUMIN: 4.3 g/dL (ref 3.5–5.2)
ALK PHOS: 200 U/L — AB (ref 39–117)
ALT: 43 U/L — ABNORMAL HIGH (ref 0–35)
AST: 42 U/L — AB (ref 0–37)
Bilirubin, Direct: 0.2 mg/dL (ref 0.0–0.3)
TOTAL PROTEIN: 7.9 g/dL (ref 6.0–8.3)
Total Bilirubin: 0.6 mg/dL (ref 0.2–1.2)

## 2016-12-01 MED ORDER — NA SULFATE-K SULFATE-MG SULF 17.5-3.13-1.6 GM/177ML PO SOLN
1.0000 | Freq: Once | ORAL | 0 refills | Status: AC
Start: 2016-12-01 — End: 2016-12-01

## 2016-12-01 NOTE — Telephone Encounter (Signed)
12/01/2016   RE: Cheryl [name] DOB: [birth year] MRN: [id number]   Dear Dr. Shelia Media,    We have scheduled the above patient for an endoscopic procedure. Our records show that she is on anticoagulation therapy.   Please advise as to how long the patient may come off her therapy of Coumadin prior to the procedure, which is scheduled for 01/15/17.  Please fax back/ or route the completed form to [facility], [place] at [phone].  Sincerely,    Thurmon Fair, RMA

## 2016-12-01 NOTE — Patient Instructions (Addendum)
If you are age 62 or older, your body mass index should be between 23-30. Your Body mass index is 19.8 kg/m. If this is out of the aforementioned range listed, please consider follow up with your Primary Care Provider.  If you are age 65 or younger, your body mass index should be between 19-25. Your Body mass index is 19.8 kg/m. If this is out of the aformentioned range listed, please consider follow up with your Primary Care Provider.   You have been scheduled for a colonoscopy. Please follow written instructions given to you at your visit today.  Please pick up your prep supplies at the pharmacy within the next 1-3 days. If you use inhalers (even only as needed), please bring them with you on the day of your procedure. Your physician has requested that you go to www.startemmi.com and enter the access code given to you at your visit today. This web site gives a general overview about your procedure. However, you should still follow specific instructions given to you by our office regarding your preparation for the procedure.  You may have a light breakfast the morning of prep day (the day before the procedure).  You may choose from one of the following items: eggs and toast OR chicken noodle soup and crackers.  You should have your breakfast completed between 8:00 and 9:00 am the day before your procedure.   After you have had your light breakfast you should start a clear liquid diet only, NO SOLIDS. No additional solid food is allowed. You may continue to have clear liquid up to 3 hours prior to your procedure.    Your physician has requested that you go to the basement for the following lab work before leaving today: LFTs  You will be contacted by our office prior to your procedure for directions on holding your Coumadin.  If you do not hear from our office 1 week prior to your scheduled procedure, please call [phone] to discuss.  Will call when we get records from PCP.   Thank you  for choosing me and [facility].   Tye Savoy, NP

## 2016-12-02 ENCOUNTER — Encounter: Payer: Self-pay | Admitting: Nurse Practitioner

## 2016-12-02 NOTE — Progress Notes (Addendum)
Addendum:  Hepatitis studies received from PCP.    HepA AB total positive, HepA AB IgM negative.   HepB antigen negative, HepB Core AB IgM negative.  HepB surface AB is reactive  HepC AB negative.   Above labs imply a previous Hep A infection or vaccination, no acute infection. HepB studies imply she received the vaccine     HPI: Patient is a 61 year old female on chronic Coumadin for a clotting disorder. referred by PCP Dr. Deland Pretty abdominal pain and abnormal liver function studies patient believes that she has hepatitis A. I don't have PCPs office notes just some labs. A couple of months ago patient developed intermittent, nonradiating LUQ pain, nausea and fatigue. Her symptoms lasted about a week. They were unrelated to eating, pain was worse with certain movements. She saw PCP 11/05/16 and labs revealed normal WBC and hgb.  Alk phos was 241 ( it was 143 in November). Normal bilirubin,  AST 51 / ALT 70 . Ultrasound  11/12/16 revealed cholelithiasis, no biliary dilation, probable fatty liver disease and a (questionable) small amount of ascites around the liver. Patient tells me that viral hepatitis studies suggested she has hepatitis A. I have requested records. She has since had repeat LFTs and her alk phos continues to rise. At last check on 11/24/16 it was 263. GGT is elevated at 109. Transaminases are about the same in the 50's / 60's.  Total bili remains normal . No recent medication changes. She denies a previous history of viral hepatitis.   Again, the LUQ pain resolved days ago. She still has occasional nausea. Lost 20 pounds since Feb 2017 but says she is starting to regain. No lower GI symptoms, bowel movements are at baseline. No rectal bleeding. Patient has never had a colon cancer screening.   Past Medical History:  Diagnosis Date  . History of blood clots     Past Surgical History:  Procedure Laterality Date  . ABDOMINAL HYSTERECTOMY    . CESAREAN SECTION     Family  History  Problem Relation Age of Onset  . Stomach cancer Mother   . Emphysema Father    Social History  Substance Use Topics  . Smoking status: Former Research scientist (life sciences)  . Smokeless tobacco: Never Used  . Alcohol use No   Current Outpatient Prescriptions  Medication Sig Dispense Refill  . sertraline (ZOLOFT) 100 MG tablet Take 1 tablet by mouth daily.  0  . warfarin (COUMADIN) 4 MG tablet Take 4 mg by mouth daily.      No current facility-administered medications for this visit.    Allergies  Allergen Reactions  . Dilaudid [Hydromorphone Hcl] Nausea Only  . Sulfa Antibiotics Hives  . Tramadol Nausea Only    Review of Systems: All systems reviewed and negative except where noted in HPI.    Physical Exam: BP 110/90   Pulse 72   Ht 5' 8" (1.727 m)   Wt 130 lb 3.2 oz (59.1 kg)   BMI 19.80 kg/m  Constitutional:  Well-developed, white female in no acute distress. Psychiatric: Normal mood and affect. Behavior is normal. EENT: Conjunctivae are normal. No scleral icterus. Neck supple.  Cardiovascular: Normal rate, regular rhythm.  Pulmonary/chest: Effort normal and breath sounds normal. No wheezing, rales or rhonchi. Abdominal: Soft, nondistended, nontender. Bowel sounds active throughout. There are no masses palpable. No hepatomegaly. Extremities: no edema Lymphadenopathy: No cervical adenopathy noted. Neurological: Alert and oriented to person place and time. Skin: Skin is warm and dry. No rashes noted.  ASSESSMENT AND PLAN:  72. 61 year old female with nausea, LUQ pain and abnormal LFTs.  Recently found to have elevated alkaline phosphatase and transaminitis. I requested labs from PCPs office.  Patient believes that she has HAV.  I doubt this is the case.  She has cholelithiasis without evidence of cholecystitis or choledocholithiasis on ultrasoun. Her GI symptoms have resolved.  -Repeat LFTs today. If not improving then needs further workup, possibly more advance imaging of  hepatobiliary area.  -Await viral hepatitis labs from PCP office. I will call her with results and further recommendations.  2. Colon cancer screening. Never had a colonoscopy. No alarm symptoms. Patient will be scheduled for a screening colonoscopy with possible polypectomy.  The risks and benefits of the procedure were discussed and the patient agrees to proceed. Coumadin will need to be held for procedure, see #3.   4. Chronic anticoagulation. She apparently has a blood clotting disorder. Hold coumadin 5 days before procedure - will instruct when and how to resume after procedure. Patient understands that there is a low but real risk of cardiovascular events such as heart attack, stroke, embolism, thrombosis or ischemia while off coumadin. The patient consents to proceed. Will communicate by phone or EMR with patient's prescribing provider to confirm that holding coumadin is reasonable in this case.   5. [place] of stomach cancer in mother.  -Patient's acute left upper quadrant pain and nausea have subsided. She did lose a lot of weight over the last year but is regaining now. No indication for EGD at this point.   Tye Savoy, NP  12/02/2016, 4:55 PM  Cc: Deland Pretty, MD

## 2016-12-03 ENCOUNTER — Telehealth: Payer: Self-pay | Admitting: Nurse Practitioner

## 2016-12-03 DIAGNOSIS — R935 Abnormal findings on diagnostic imaging of other abdominal regions, including retroperitoneum: Secondary | ICD-10-CM

## 2016-12-03 NOTE — Progress Notes (Signed)
I agree with the above note, plan  Cheryl [name], Alk phos and transaminases still elevated. Unclear etiology. Given the pain, known stones I think MRI with MRCP is a good next step. Also needs further serologic workup (AMA, ASMA, ANA)

## 2016-12-04 ENCOUNTER — Other Ambulatory Visit: Payer: Self-pay

## 2016-12-04 DIAGNOSIS — R945 Abnormal results of liver function studies: Secondary | ICD-10-CM

## 2016-12-04 DIAGNOSIS — R1084 Generalized abdominal pain: Secondary | ICD-10-CM

## 2016-12-04 DIAGNOSIS — R7989 Other specified abnormal findings of blood chemistry: Secondary | ICD-10-CM

## 2016-12-04 NOTE — Telephone Encounter (Signed)
Pt is calling back about her lab results Would like a call before 2:30 today

## 2016-12-08 ENCOUNTER — Other Ambulatory Visit: Payer: BLUE CROSS/BLUE SHIELD

## 2016-12-08 DIAGNOSIS — R7989 Other specified abnormal findings of blood chemistry: Secondary | ICD-10-CM

## 2016-12-08 DIAGNOSIS — R945 Abnormal results of liver function studies: Principal | ICD-10-CM

## 2016-12-09 LAB — ANTI-NUCLEAR AB-TITER (ANA TITER)

## 2016-12-09 LAB — ANTI-SMOOTH MUSCLE ANTIBODY, IGG: Smooth Muscle Ab: <20 U (ref <20)

## 2016-12-09 LAB — ANA: Anti Nuclear Antibody(ANA): POSITIVE — AB

## 2016-12-10 LAB — MITOCHONDRIAL ANTIBODIES: Mitochondrial M2 Ab, IgG: <=20 Units (ref <=20.0)

## 2016-12-17 ENCOUNTER — Other Ambulatory Visit: Payer: Self-pay

## 2016-12-17 ENCOUNTER — Ambulatory Visit
Admission: RE | Admit: 2016-12-17 | Discharge: 2016-12-17 | Disposition: A | Discharge status: Home / Self Care | Payer: BLUE CROSS/BLUE SHIELD | Source: Ambulatory Visit | Attending: Nurse Practitioner | Admitting: Nurse Practitioner

## 2016-12-17 ENCOUNTER — Telehealth: Payer: Self-pay | Admitting: Gastroenterology

## 2016-12-17 DIAGNOSIS — R7989 Other specified abnormal findings of blood chemistry: Secondary | ICD-10-CM

## 2016-12-17 DIAGNOSIS — C787 Secondary malignant neoplasm of liver and intrahepatic bile duct: Secondary | ICD-10-CM | POA: Diagnosis not present

## 2016-12-17 DIAGNOSIS — R935 Abnormal findings on diagnostic imaging of other abdominal regions, including retroperitoneum: Secondary | ICD-10-CM

## 2016-12-17 DIAGNOSIS — R1084 Generalized abdominal pain: Secondary | ICD-10-CM

## 2016-12-17 DIAGNOSIS — R945 Abnormal results of liver function studies: Secondary | ICD-10-CM

## 2016-12-17 DIAGNOSIS — R9389 Abnormal findings on diagnostic imaging of other specified body structures: Secondary | ICD-10-CM

## 2016-12-17 MED ORDER — GADOBENATE DIMEGLUMINE 529 MG/ML IV SOLN
12.0000 mL | Freq: Once | INTRAVENOUS | Status: AC | PRN
  Administered 2016-12-17: 12 mL via INTRAVENOUS

## 2016-12-17 NOTE — Telephone Encounter (Signed)
EGD/colonoscopy scheduled for 12/24/16 arrive at 12:15 pm. Patient is aware. She will come to the office and pick up new instructions. Referral to IR entered and acknowledged by Vivien Rota. I am sending a letter to Dr Shelia Media to ask for Warfarin to be held for 4 days prior to the liver biopsy. Also asked to wait on scheduling until after 12/24/16. Referral to Med Onc entered into EPIC

## 2016-12-17 NOTE — Addendum Note (Signed)
Addended by: Virgina Evener A on: 12/17/2016 03:33 PM   Modules accepted: Orders

## 2016-12-17 NOTE — Telephone Encounter (Signed)
See alternate note  

## 2016-12-17 NOTE — Telephone Encounter (Signed)
I spoke with her about MRI results.  Metastatic disease in liver, diffusely.  She needs  1. Colonoscopy/EGD with me next Thursday MAC at [facility]. Will need to hold coumadin 5 days prior, will need coumadin letter from the prescribing MD. FH of gastric cancer and cecum looks unusual on MRI. 2. Referral to IR for liver biopsy; would be best if this could be scheduled not to be done before the colon/EGD above. IF I find clear cancer on either of those exams she will probably NOT need IR biopsy. 3. Referral to medical oncology.

## 2016-12-17 NOTE — Telephone Encounter (Signed)
Beth has scheduled procedure and made referrals.

## 2016-12-18 ENCOUNTER — Telehealth: Payer: Self-pay

## 2016-12-18 ENCOUNTER — Other Ambulatory Visit: Payer: Self-pay

## 2016-12-18 DIAGNOSIS — R933 Abnormal findings on diagnostic imaging of other parts of digestive tract: Secondary | ICD-10-CM

## 2016-12-18 NOTE — Telephone Encounter (Signed)
Dr Ardis Hughs or Nevin Bloodgood,  The pt's husband walked in to discuss the pt's diagnosis.  He would appreciate a call from either of you to discuss.  He is aware that it will be Monday before we can get him a return call and he was ok with that.  He was very kind and thanked me for talking with him

## 2016-12-21 ENCOUNTER — Ambulatory Visit: Payer: BLUE CROSS/BLUE SHIELD | Admitting: Gastroenterology

## 2016-12-21 NOTE — Telephone Encounter (Signed)
I spoke with her husband and answered all his questions.   Patty, Can you contact them to go over the details of her prep/arrival time and location for the procedures this week.  He seemed a bit unsure of some things.  Thanks  DJ

## 2016-12-22 ENCOUNTER — Encounter (HOSPITAL_COMMUNITY): Payer: Self-pay | Admitting: *Deleted

## 2016-12-24 ENCOUNTER — Encounter (HOSPITAL_COMMUNITY): Payer: Self-pay

## 2016-12-24 ENCOUNTER — Ambulatory Visit (HOSPITAL_COMMUNITY): Payer: BLUE CROSS/BLUE SHIELD | Admitting: Anesthesiology

## 2016-12-24 ENCOUNTER — Telehealth: Payer: Self-pay

## 2016-12-24 ENCOUNTER — Encounter (HOSPITAL_COMMUNITY)
Admission: RE | Disposition: A | Discharge status: Home / Self Care | Payer: Self-pay | Source: Ambulatory Visit | Attending: Gastroenterology

## 2016-12-24 ENCOUNTER — Ambulatory Visit (HOSPITAL_COMMUNITY)
Admission: RE | Admit: 2016-12-24 | Discharge: 2016-12-24 | Disposition: A | Discharge status: Home / Self Care | Payer: BLUE CROSS/BLUE SHIELD | Source: Ambulatory Visit | Attending: Gastroenterology | Admitting: Gastroenterology

## 2016-12-24 DIAGNOSIS — Z86718 Personal history of other venous thrombosis and embolism: Secondary | ICD-10-CM | POA: Insufficient documentation

## 2016-12-24 DIAGNOSIS — Z1211 Encounter for screening for malignant neoplasm of colon: Secondary | ICD-10-CM | POA: Diagnosis not present

## 2016-12-24 DIAGNOSIS — D490 Neoplasm of unspecified behavior of digestive system: Secondary | ICD-10-CM

## 2016-12-24 DIAGNOSIS — K3189 Other diseases of stomach and duodenum: Secondary | ICD-10-CM | POA: Diagnosis not present

## 2016-12-24 DIAGNOSIS — Z800 Family history of malignant neoplasm of digestive organs: Secondary | ICD-10-CM | POA: Diagnosis not present

## 2016-12-24 DIAGNOSIS — Z79899 Other long term (current) drug therapy: Secondary | ICD-10-CM | POA: Diagnosis not present

## 2016-12-24 DIAGNOSIS — Z7901 Long term (current) use of anticoagulants: Secondary | ICD-10-CM | POA: Diagnosis not present

## 2016-12-24 DIAGNOSIS — Z87891 Personal history of nicotine dependence: Secondary | ICD-10-CM | POA: Insufficient documentation

## 2016-12-24 DIAGNOSIS — K8020 Calculus of gallbladder without cholecystitis without obstruction: Secondary | ICD-10-CM | POA: Diagnosis not present

## 2016-12-24 DIAGNOSIS — K319 Disease of stomach and duodenum, unspecified: Secondary | ICD-10-CM | POA: Insufficient documentation

## 2016-12-24 DIAGNOSIS — K2960 Other gastritis without bleeding: Secondary | ICD-10-CM | POA: Diagnosis not present

## 2016-12-24 DIAGNOSIS — R938 Abnormal findings on diagnostic imaging of other specified body structures: Secondary | ICD-10-CM

## 2016-12-24 DIAGNOSIS — K2970 Gastritis, unspecified, without bleeding: Secondary | ICD-10-CM | POA: Diagnosis not present

## 2016-12-24 DIAGNOSIS — D689 Coagulation defect, unspecified: Secondary | ICD-10-CM | POA: Insufficient documentation

## 2016-12-24 DIAGNOSIS — Z90710 Acquired absence of both cervix and uterus: Secondary | ICD-10-CM | POA: Insufficient documentation

## 2016-12-24 DIAGNOSIS — Z825 Family history of asthma and other chronic lower respiratory diseases: Secondary | ICD-10-CM | POA: Insufficient documentation

## 2016-12-24 DIAGNOSIS — R9389 Abnormal findings on diagnostic imaging of other specified body structures: Secondary | ICD-10-CM

## 2016-12-24 HISTORY — PX: COLONOSCOPY WITH PROPOFOL: SHX5780

## 2016-12-24 HISTORY — PX: ESOPHAGOGASTRODUODENOSCOPY (EGD) WITH PROPOFOL: SHX5813

## 2016-12-24 HISTORY — DX: Nausea with vomiting, unspecified: R11.2

## 2016-12-24 HISTORY — DX: Hepatomegaly, not elsewhere classified: R16.0

## 2016-12-24 HISTORY — DX: Other specified postprocedural states: Z98.890

## 2016-12-24 SURGERY — ESOPHAGOGASTRODUODENOSCOPY (EGD) WITH PROPOFOL
Anesthesia: Monitor Anesthesia Care

## 2016-12-24 MED ORDER — PROPOFOL 500 MG/50ML IV EMUL
INTRAVENOUS | Status: DC | PRN
  Administered 2016-12-24: 150 ug/kg/min via INTRAVENOUS

## 2016-12-24 MED ORDER — PROPOFOL 500 MG/50ML IV EMUL
INTRAVENOUS | Status: DC | PRN
  Administered 2016-12-24: 100 mg via INTRAVENOUS

## 2016-12-24 MED ORDER — LACTATED RINGERS IV SOLN
INTRAVENOUS | Status: DC
  Administered 2016-12-24: 1000 mL via INTRAVENOUS

## 2016-12-24 MED ORDER — LIDOCAINE 2% (20 MG/ML) 5 ML SYRINGE
INTRAMUSCULAR | Status: AC
  Filled 2016-12-24: qty 5

## 2016-12-24 MED ORDER — LIDOCAINE HCL (CARDIAC) 20 MG/ML IV SOLN
INTRAVENOUS | Status: DC | PRN
  Administered 2016-12-24: 60 mg via INTRAVENOUS

## 2016-12-24 MED ORDER — SODIUM CHLORIDE 0.9 % IV SOLN: INTRAVENOUS | Status: DC

## 2016-12-24 MED ORDER — PROPOFOL 10 MG/ML IV BOLUS
INTRAVENOUS | Status: AC
  Filled 2016-12-24: qty 60

## 2016-12-24 MED ORDER — LACTATED RINGERS IV SOLN
INTRAVENOUS | Status: DC | PRN
  Administered 2016-12-24: 12:00:00 via INTRAVENOUS

## 2016-12-24 SURGICAL SUPPLY — 25 items

## 2016-12-24 NOTE — Op Note (Signed)
[facility] Patient Name: Cheryl [name] Procedure Date: 12/24/2016 MRN: [id number] Attending MD: Milus Banister , MD Date of Birth: 11/06/1955 CSN: [id number] Age: 61 Admit Type: Outpatient Procedure:                Upper GI endoscopy Indications:              Abnormal MRI of the GI tract; multiple tumors in                            liver, mother with gastric cancer Providers:                Milus Banister, MD, Elmer Ramp. Tilden Dome, RN, Marcene Duos, Technician Referring MD:              Medicines:                Monitored Anesthesia Care Complications:            No immediate complications. Estimated blood loss:                            None. Estimated Blood Loss:     Estimated blood loss: none. Procedure:                Pre-Anesthesia Assessment:                           - Prior to the procedure, a History and Physical                            was performed, and patient medications and                            allergies were reviewed. The patient's tolerance of                            previous anesthesia was also reviewed. The risks                            and benefits of the procedure and the sedation                            options and risks were discussed with the patient.                            All questions were answered, and informed consent                            was obtained. Prior Anticoagulants: The patient has                            taken Coumadin (warfarin), last dose was 5 days  prior to procedure. ASA Grade Assessment: II - A                            patient with mild systemic disease. After reviewing                            the risks and benefits, the patient was deemed in                            satisfactory condition to undergo the procedure.                           After obtaining informed consent, the endoscope was                            passed under  direct vision. Throughout the                            procedure, the patient's blood pressure, pulse, and                            oxygen saturations were monitored continuously. The                            Endoscope was introduced through the mouth, and                            advanced to the second part of duodenum. The upper                            GI endoscopy was accomplished without difficulty.                            The patient tolerated the procedure well. Scope In: Scope Out: Findings:      The esophagus was normal.      There was slightly nodular appearing gastritis in the distal stomach,       antrum. This was not overtly neoplastic. Biopsies were taken with a cold       forceps for histology.      The examined duodenum was normal. Impression:               - Nodular distal gastritis, biospied. Moderate Sedation:      N/A- Per Anesthesia Care Recommendation:           - Patient has a contact number available for                            emergencies. The signs and symptoms of potential                            delayed complications were discussed with the                            patient. Return to normal activities tomorrow.  Written discharge instructions were provided to the                            patient.                           - Resume previous diet.                           - Continue present medications.                           - Await pathology results.                           - The findings on the colonoscopy and EGD today do                            not likely explain her liver tumors noted on abd                            MRI. My office will arrange for further imaging                            (CTs scan chest, abd and pelvis) and she will go                            ahead with the IR percutaneous liver biopsy. Procedure Code(s):        --- Professional ---                           [phone],  Esophagogastroduodenoscopy, flexible,                            transoral; with biopsy, single or multiple Diagnosis Code(s):        --- Professional ---                           K29.70, Gastritis, unspecified, without bleeding                           R93.3, Abnormal findings on diagnostic imaging of                            other parts of digestive tract CPT copyright 2016 American Medical Association. All rights reserved. The codes documented in this report are preliminary and upon coder review may  be revised to meet current compliance requirements. Milus Banister, MD 12/24/2016 1:53:56 PM This report has been signed electronically. Number of Addenda: 0

## 2016-12-24 NOTE — Transfer of Care (Signed)
Immediate Anesthesia Transfer of Care Note  Patient: Cheryl [name]  Procedure(s) Performed: Procedure(s): ESOPHAGOGASTRODUODENOSCOPY (EGD) WITH PROPOFOL (N/A) COLONOSCOPY WITH PROPOFOL (N/A)  Patient Location: PACU  Anesthesia Type:MAC  Level of Consciousness: awake, alert  and oriented  Airway & Oxygen Therapy: Patient Spontanous Breathing and Patient connected to nasal cannula oxygen  Post-op Assessment: Report given to RN and Post -op Vital signs reviewed and stable  Post vital signs: Reviewed and stable  Last Vitals:  Vitals:   12/24/16 1214  BP: (!) 149/79  Resp: 15  Temp: 36.8 C    Last Pain:  Vitals:   12/24/16 1214  TempSrc: Oral         Complications: No apparent anesthesia complications

## 2016-12-24 NOTE — Telephone Encounter (Signed)
Message left with Elmo Putt at [facility] scheduling to set up liver biopsy.    You have been scheduled for a CT scan of the abdomen and pelvis at [facility] ([address]---this is in the same building as [facility]).   You are scheduled on 12/29/16 at 2:30 pm. You should arrive 15 minutes prior to your appointment time for registration. Please follow the written instructions below on the day of your exam:  WARNING: IF YOU ARE ALLERGIC TO IODINE/X-RAY DYE, PLEASE NOTIFY RADIOLOGY IMMEDIATELY AT [phone]! YOU WILL BE GIVEN A 13 HOUR PREMEDICATION PREP.  1) Do not eat or drink anything after 1030 am (4 hours prior to your test) 2) You have been given 2 bottles of oral contrast to drink. The solution may taste better if refrigerated, but do NOT add ice or any other liquid to this solution. Shake             well before drinking.   Drink 1 bottle of contrast @ 1230 pm (2 hours prior to your exam) 1030 amDrink 1 bottle of contrast @ 130 pm (1 hour prior to your exam)  You may take any medications as prescribed with a small amount of water except for the following: Metformin, Glucophage, Glucovance, Avandamet, Riomet, Fortamet, Actoplus Met, Janumet, Glumetza or Metaglip. The above medications must be held the day of the exam AND 48 hours after the exam.  The purpose of you drinking the oral contrast is to aid in the visualization of your intestinal tract. The contrast solution may cause some diarrhea. Before your exam is started, you will be given a small amount of fluid to drink. Depending on your individual set of symptoms, you may also receive an intravenous injection of x-ray contrast/dye. Plan on being at [facility] for 30 minutes or longer, depending on the type of exam you are having performed.  This test typically takes 30-45 minutes to complete.  If you have any questions regarding your exam or if you need to reschedule, you may call the CT department at  [phone] between the hours of 8:00 am and 5:00 pm, Monday-Friday.  ________________________________________________________________________

## 2016-12-24 NOTE — Anesthesia Postprocedure Evaluation (Signed)
Anesthesia Post Note  Patient: Cheryl [name]  Procedure(s) Performed: Procedure(s) (LRB): ESOPHAGOGASTRODUODENOSCOPY (EGD) WITH PROPOFOL (N/A) COLONOSCOPY WITH PROPOFOL (N/A)     Patient location during evaluation: Endoscopy Anesthesia Type: MAC Level of consciousness: awake and alert Pain management: pain level controlled Vital Signs Assessment: post-procedure vital signs reviewed and stable Respiratory status: spontaneous breathing and respiratory function stable Cardiovascular status: stable Anesthetic complications: no    Last Vitals:  Vitals:   12/24/16 1352 12/24/16 1400  BP: (!) 120/58 116/69  Resp: 15 (!) 22  Temp:  36.7 C    Last Pain:  Vitals:   12/24/16 1352  TempSrc: Oral  PainSc: [place]

## 2016-12-24 NOTE — H&P (View-Only) (Signed)
Addendum:  Hepatitis studies received from PCP.    HepA AB total positive, HepA AB IgM negative.   HepB antigen negative, HepB Core AB IgM negative.  HepB surface AB is reactive  HepC AB negative.   Above labs imply a previous Hep A infection or vaccination, no acute infection. HepB studies imply she received the vaccine     HPI: Patient is a 61-year-old female on chronic Coumadin for a clotting disorder. referred by PCP Dr. Walter [name] abdominal pain and abnormal liver function studies patient believes that she has hepatitis A. I don't have PCPs office notes just some labs. A couple of months ago patient developed intermittent, nonradiating LUQ pain, nausea and fatigue. Her symptoms lasted about a week. They were unrelated to eating, pain was worse with certain movements. She saw PCP 11/05/16 and labs revealed normal WBC and hgb.  Alk phos was 241 ( it was 143 in November). Normal bilirubin,  AST 51 / ALT 70 . Ultrasound  11/12/16 revealed cholelithiasis, no biliary dilation, probable fatty liver disease and a (questionable) small amount of ascites around the liver. Patient tells me that viral hepatitis studies suggested she has hepatitis A. I have requested records. She has since had repeat LFTs and her alk phos continues to rise. At last check on 11/24/16 it was 263. GGT is elevated at 109. Transaminases are about the same in the 50's / 60's.  Total bili remains normal . No recent medication changes. She denies a previous history of viral hepatitis.   Again, the LUQ pain resolved days ago. She still has occasional nausea. Lost 20 pounds since Feb 2017 but says she is starting to regain. No lower GI symptoms, bowel movements are at baseline. No rectal bleeding. Patient has never had a colon cancer screening.   Past Medical History:  Diagnosis Date  . History of blood clots     Past Surgical History:  Procedure Laterality Date  . ABDOMINAL HYSTERECTOMY    . CESAREAN SECTION     Family  History  Problem Relation Age of Onset  . Stomach cancer Mother   . Emphysema Father    Social History  Substance Use Topics  . Smoking status: Former Smoker  . Smokeless tobacco: Never Used  . Alcohol use No   Current Outpatient Prescriptions  Medication Sig Dispense Refill  . sertraline (ZOLOFT) 100 MG tablet Take 1 tablet by mouth daily.  0  . warfarin (COUMADIN) 4 MG tablet Take 4 mg by mouth daily.      No current facility-administered medications for this visit.    Allergies  Allergen Reactions  . Dilaudid [Hydromorphone Hcl] Nausea Only  . Sulfa Antibiotics Hives  . Tramadol Nausea Only    Review of Systems: All systems reviewed and negative except where noted in HPI.    Physical Exam: BP 110/90   Pulse 72   Ht 5' 8" (1.727 m)   Wt 130 lb 3.2 oz (59.1 kg)   BMI 19.80 kg/m  Constitutional:  Well-developed, white female in no acute distress. Psychiatric: Normal mood and affect. Behavior is normal. EENT: Conjunctivae are normal. No scleral icterus. Neck supple.  Cardiovascular: Normal rate, regular rhythm.  Pulmonary/chest: Effort normal and breath sounds normal. No wheezing, rales or rhonchi. Abdominal: Soft, nondistended, nontender. Bowel sounds active throughout. There are no masses palpable. No hepatomegaly. Extremities: no edema Lymphadenopathy: No cervical adenopathy noted. Neurological: Alert and oriented to person place and time. Skin: Skin is warm and dry. No rashes noted.     ASSESSMENT AND PLAN:  1. 61-year-old female with nausea, LUQ pain and abnormal LFTs.  Recently found to have elevated alkaline phosphatase and transaminitis. I requested labs from PCPs office.  Patient believes that she has HAV.  I doubt this is the case.  She has cholelithiasis without evidence of cholecystitis or choledocholithiasis on ultrasoun. Her GI symptoms have resolved.  -Repeat LFTs today. If not improving then needs further workup, possibly more advance imaging of  hepatobiliary area.  -Await viral hepatitis labs from PCP office. I will call her with results and further recommendations.  2. Colon cancer screening. Never had a colonoscopy. No alarm symptoms. Patient will be scheduled for a screening colonoscopy with possible polypectomy.  The risks and benefits of the procedure were discussed and the patient agrees to proceed. Coumadin will need to be held for procedure, see #3.   4. Chronic anticoagulation. She apparently has a blood clotting disorder. Hold coumadin 5 days before procedure - will instruct when and how to resume after procedure. Patient understands that there is a low but real risk of cardiovascular events such as heart attack, stroke, embolism, thrombosis or ischemia while off coumadin. The patient consents to proceed. Will communicate by phone or EMR with patient's prescribing provider to confirm that holding coumadin is reasonable in this case.   5. FMH of stomach cancer in mother.  -Patient's acute left upper quadrant pain and nausea have subsided. She did lose a lot of weight over the last year but is regaining now. No indication for EGD at this point.   Cheryl Rebstock, NP  12/02/2016, 4:55 PM  Cc: [name], Walter, MD  

## 2016-12-24 NOTE — Op Note (Signed)
[facility] Patient Name: Cheryl [name] Procedure Date: 12/24/2016 MRN: [id number] Attending MD: Milus Banister , MD Date of Birth: 30-Oct-1955 CSN: [id number] Age: 61 Admit Type: Outpatient Procedure:                Colonoscopy Indications:              Screening for colorectal malignant neoplasm; recent                            CT scan showed tumors in liver, unknown primary Providers:                Milus Banister, MD, Elmer Ramp. Tilden Dome, RN, Marcene Duos, Technician Referring MD:              Medicines:                Monitored Anesthesia Care Complications:            No immediate complications. Estimated blood loss:                            None. Estimated Blood Loss:     Estimated blood loss: none. Procedure:                Pre-Anesthesia Assessment:                           - Prior to the procedure, a History and Physical                            was performed, and patient medications and                            allergies were reviewed. The patient's tolerance of                            previous anesthesia was also reviewed. The risks                            and benefits of the procedure and the sedation                            options and risks were discussed with the patient.                            All questions were answered, and informed consent                            was obtained. Prior Anticoagulants: The patient has                            taken Coumadin (warfarin), last dose was 5 days  prior to procedure. ASA Grade Assessment: II - A                            patient with mild systemic disease. After reviewing                            the risks and benefits, the patient was deemed in                            satisfactory condition to undergo the procedure.                           After obtaining informed consent, the colonoscope                            was  passed under direct vision. Throughout the                            procedure, the patient's blood pressure, pulse, and                            oxygen saturations were monitored continuously. The                            EC-[id number] ([id number]) scope was introduced through                            the anus and advanced to the the cecum, identified                            by appendiceal orifice and ileocecal valve. The                            colonoscopy was performed without difficulty. The                            patient tolerated the procedure well. The quality                            of the bowel preparation was good. The ileocecal                            valve, appendiceal orifice were clearly identified                            but we had technical difficulties taking pictures                            during the first several minutes of the exam. The                            rectum was photographed. Scope In: 1:20:53 PM Scope Out: 1:32:12 PM Scope Withdrawal Time: 0 hours 4 minutes 21  seconds  Total Procedure Duration: 0 hours 11 minutes 19 seconds  Findings:      The entire examined colon appeared normal on direct and retroflexion       views. Impression:               - The entire examined colon is normal on direct and                            retroflexion views.                           - No polyps or cancers. Moderate Sedation:      N/A- Per Anesthesia Care Recommendation:           - Patient has a contact number available for                            emergencies. The signs and symptoms of potential                            delayed complications were discussed with the                            patient. Return to normal activities tomorrow.                            Written discharge instructions were provided to the                            patient.                           - Resume previous diet.                           - Continue  present medications.                           - No repeat colonoscopy.                           - EGD now (mother had gastric cancer). Procedure Code(s):        --- Professional ---                           [phone], Colonoscopy, flexible; diagnostic, including                            collection of specimen(s) by brushing or washing,                            when performed (separate procedure) Diagnosis Code(s):        --- Professional ---                           Z12.11, Encounter for screening for malignant  neoplasm of colon CPT copyright 2016 [employer]. All rights reserved. The codes documented in this report are preliminary and upon coder review may  be revised to meet current compliance requirements. Milus Banister, MD 12/24/2016 1:35:14 PM This report has been signed electronically. Number of Addenda: 0

## 2016-12-24 NOTE — Telephone Encounter (Signed)
CT scheduled and message to [facility] for liver biopsy.  Pt also needs to have labs prior to CT and will need to pick up contrast and instructions

## 2016-12-24 NOTE — Discharge Instructions (Signed)
YOU HAD AN ENDOSCOPIC PROCEDURE TODAY: Refer to the procedure report and other information in the discharge instructions given to you for any specific questions about what was found during the examination. If this information does not answer your questions, please call [facility] office at [phone] to clarify.   YOU SHOULD EXPECT: Some feelings of bloating in the abdomen. Passage of more gas than usual. Walking can help get rid of the air that was put into your GI tract during the procedure and reduce the bloating. If you had a lower endoscopy (such as a colonoscopy or flexible sigmoidoscopy) you may notice spotting of blood in your stool or on the toilet paper. Some abdominal soreness may be present for a day or two, also.  DIET: Your first meal following the procedure should be a light meal and then it is ok to progress to your normal diet. A half-sandwich or bowl of soup is an example of a good first meal. Heavy or fried foods are harder to digest and may make you feel nauseous or bloated. Drink plenty of fluids but you should avoid alcoholic beverages for 24 hours. If you had a esophageal dilation, please see attached instructions for diet.   ACTIVITY: Your care partner should take you home directly after the procedure. You should plan to take it easy, moving slowly for the rest of the day. You can resume normal activity the day after the procedure however YOU SHOULD NOT DRIVE, use power tools, machinery or perform tasks that involve climbing or major physical exertion for 24 hours (because of the sedation medicines used during the test).   SYMPTOMS TO REPORT IMMEDIATELY: A gastroenterologist can be reached at any hour. Please call [phone]  for any of the following symptoms:  Following lower endoscopy (colonoscopy, flexible sigmoidoscopy) Excessive amounts of blood in the stool  Significant tenderness, worsening of abdominal pains  Swelling of the abdomen that is new, acute  Fever of 100 or  higher  Following upper endoscopy (EGD, EUS, ERCP, esophageal dilation) Vomiting of blood or coffee ground material  New, significant abdominal pain  New, significant chest pain or pain under the shoulder blades  Painful or persistently difficult swallowing  New shortness of breath  Black, tarry-looking or red, bloody stools  FOLLOW UP:  If any biopsies were taken you will be contacted by phone or by letter within the next 1-3 weeks. Call [phone]  if you have not heard about the biopsies in 3 weeks.  Please also call with any specific questions about appointments or follow up tests. YOU HAD AN ENDOSCOPIC PROCEDURE TODAY: Refer to the procedure report that was given to you for any specific questions about what was found during the examination.  If the procedure report does not answer your questions, please call your gastroenterologist to clarify.  YOU SHOULD EXPECT: Some feelings of bloating in the abdomen. Passage of more gas than usual.  Walking can help get rid of the air that was put into your GI tract during the procedure and reduce the bloating. If you had a lower endoscopy (such as a colonoscopy or flexible sigmoidoscopy) you may notice spotting of blood in your stool or on the toilet paper.   DIET: Your first meal following the procedure should be a light meal and then it is ok to progress to your normal diet.  A half-sandwich or bowl of soup is an example of a good first meal.  Heavy or fried foods are harder to digest and may  make you feel nasueas or bloated.  Drink plenty of fluids but you should avoid alcoholic beverages for 24 hours. ° °ACTIVITY: Your care partner should take you home directly after the procedure.  You should plan to take it easy, moving slowly for the rest of the day.  You can resume normal activity the day after the procedure however you should NOT DRIVE or use heavy machinery for 24 hours (because of the sedation medicines used during the test).   ° °SYMPTOMS TO  REPORT IMMEDIATELY  °A gastroenterologist can be reached at any hour.  Please call your doctor's office for any of the following symptoms: ° °· Following lower endoscopy (colonoscopy, flexible sigmoidoscopy) ° Excessive amounts of blood in the stool ° Significant tenderness, worsening of abdominal pains ° Swelling of the abdomen that is new, acute ° Fever of 100° or higher °· Following upper endoscopy (EGD, EUS, ERCP) ° Vomiting of blood or coffee ground material ° New, significant abdominal pain ° New, significant chest pain or pain under the shoulder blades ° Painful or persistently difficult swallowing ° New shortness of breath ° Black, tarry-looking stools ° °FOLLOW UP: °If any biopsies were taken you will be contacted by phone or by letter within the next 1-3 weeks.  Call your gastroenterologist if you have not heard about the biopsies in 3 weeks.  °Please also call your gastroenterologist's office with any specific questions about appointments or follow up tests. ° °

## 2016-12-24 NOTE — Interval H&P Note (Signed)
History and Physical Interval Note:  12/24/2016 12:31 PM  [name]  has presented today for surgery, with the diagnosis of colon mass  The various methods of treatment have been discussed with the patient and family. After consideration of risks, benefits and other options for treatment, the patient has consented to  Procedure(s): ESOPHAGOGASTRODUODENOSCOPY (EGD) WITH PROPOFOL (N/A) COLONOSCOPY WITH PROPOFOL (N/A) as a surgical intervention .  The patient's history has been reviewed, patient examined, no change in status, stable for surgery.  I have reviewed the patient's chart and labs.  Questions were answered to the patient's satisfaction.     Cheryl [name]

## 2016-12-24 NOTE — Telephone Encounter (Signed)
-----   Message from Milus Banister, MD sent at 12/24/2016  1:54 PM EDT ----- Chong Sicilian, The colonoscopy/EGD done today do not explain her liver tumors. She needs: 1. CT scan chest, abd, pelvis with Iv and PO contrast 2. Can you make sure she is getting scheduled to IR ([place] or CT) guided biopsy of liver tumors.  thanks

## 2016-12-24 NOTE — Anesthesia Preprocedure Evaluation (Addendum)
Anesthesia Evaluation  Patient identified by MRN, date of birth, ID band Patient awake    Reviewed: Allergy & Precautions, NPO status , Patient's Chart, lab work & pertinent test results  History of Anesthesia Complications (+) PONV and history of anesthetic complications  Airway Mallampati: II  TM Distance: >3 FB Neck ROM: Full    Dental no notable dental hx. (+) Dental Advisory Given   Pulmonary former smoker,    Pulmonary exam normal        Cardiovascular negative cardio ROS Normal cardiovascular exam     Neuro/Psych negative neurological ROS  negative psych ROS   GI/Hepatic negative GI ROS,   Endo/Other  negative endocrine ROS  Renal/GU negative Renal ROS  negative genitourinary   Musculoskeletal negative musculoskeletal ROS (+)   Abdominal   Peds negative pediatric ROS (+)  Hematology negative hematology ROS (+)   Anesthesia Other Findings   Reproductive/Obstetrics negative OB ROS                            Anesthesia Physical Anesthesia Plan  ASA: III  Anesthesia Plan: MAC   Post-op Pain Management:    Induction:   PONV Risk Score and Plan: Ondansetron, Propofol and Dexamethasone  Airway Management Planned: Natural Airway  Additional Equipment:   Intra-op Plan:   Post-operative Plan: Extubation in OR  Informed Consent: I have reviewed the patients History and Physical, chart, labs and discussed the procedure including the risks, benefits and alternatives for the proposed anesthesia with the patient or authorized representative who has indicated his/her understanding and acceptance.   Dental advisory given  Plan Discussed with: Anesthesiologist and CRNA  Anesthesia Plan Comments:         Anesthesia Quick Evaluation

## 2016-12-25 ENCOUNTER — Encounter (HOSPITAL_COMMUNITY): Payer: Self-pay | Admitting: Gastroenterology

## 2016-12-25 ENCOUNTER — Telehealth: Payer: Self-pay

## 2016-12-25 ENCOUNTER — Other Ambulatory Visit (INDEPENDENT_AMBULATORY_CARE_PROVIDER_SITE_OTHER): Payer: BLUE CROSS/BLUE SHIELD

## 2016-12-25 DIAGNOSIS — D490 Neoplasm of unspecified behavior of digestive system: Secondary | ICD-10-CM | POA: Diagnosis not present

## 2016-12-25 LAB — CREATININE, SERUM: CREATININE: 0.78 mg/dL (ref 0.40–1.20)

## 2016-12-25 LAB — BUN: BUN: 15 mg/dL (ref 6–23)

## 2016-12-25 NOTE — Telephone Encounter (Signed)
Alicia at Radiology was notified to go ahead and set up Liver biopsy, she will call the pt.

## 2016-12-25 NOTE — Telephone Encounter (Signed)
Erroneous entry

## 2016-12-25 NOTE — Telephone Encounter (Signed)
The pt has been instructed for CT and will pick up contrast and have labs today.

## 2016-12-28 ENCOUNTER — Encounter (HOSPITAL_COMMUNITY): Payer: Self-pay | Admitting: Emergency Medicine

## 2016-12-28 ENCOUNTER — Emergency Department (HOSPITAL_COMMUNITY)
Admission: EM | Admit: 2016-12-28 | Discharge: 2016-12-28 | Disposition: A | Discharge status: Home / Self Care | Payer: BLUE CROSS/BLUE SHIELD | Attending: Emergency Medicine | Admitting: Emergency Medicine

## 2016-12-28 ENCOUNTER — Telehealth: Payer: Self-pay | Admitting: Gastroenterology

## 2016-12-28 DIAGNOSIS — Z87891 Personal history of nicotine dependence: Secondary | ICD-10-CM | POA: Insufficient documentation

## 2016-12-28 DIAGNOSIS — Z7901 Long term (current) use of anticoagulants: Secondary | ICD-10-CM | POA: Insufficient documentation

## 2016-12-28 DIAGNOSIS — R1012 Left upper quadrant pain: Secondary | ICD-10-CM | POA: Diagnosis not present

## 2016-12-28 DIAGNOSIS — N390 Urinary tract infection, site not specified: Secondary | ICD-10-CM

## 2016-12-28 DIAGNOSIS — R109 Unspecified abdominal pain: Secondary | ICD-10-CM

## 2016-12-28 LAB — COMPREHENSIVE METABOLIC PANEL
ALT: 1053 U/L — AB (ref 14–54)
AST: 963 U/L — AB (ref 15–41)
Albumin: 4 g/dL (ref 3.5–5.0)
Alkaline Phosphatase: 322 U/L — ABNORMAL HIGH (ref 38–126)
Anion gap: 10 (ref 5–15)
BILIRUBIN TOTAL: 1.7 mg/dL — AB (ref 0.3–1.2)
BUN: 13 mg/dL (ref 6–20)
CHLORIDE: 101 mmol/L (ref 101–111)
CO2: 28 mmol/L (ref 22–32)
CREATININE: 0.67 mg/dL (ref 0.44–1.00)
Calcium: 9.4 mg/dL (ref 8.9–10.3)
GFR calc non Af Amer: >60 mL/min (ref >60)
Glucose, Bld: 109 mg/dL — ABNORMAL HIGH (ref 65–99)
POTASSIUM: 3.9 mmol/L (ref 3.5–5.1)
Sodium: 139 mmol/L (ref 135–145)
TOTAL PROTEIN: 8 g/dL (ref 6.5–8.1)

## 2016-12-28 LAB — URINALYSIS, ROUTINE W REFLEX MICROSCOPIC
BILIRUBIN URINE: NEGATIVE
Glucose, UA: NEGATIVE mg/dL
KETONES UR: NEGATIVE mg/dL
Nitrite: NEGATIVE
PH: 5 (ref 5.0–8.0)
Protein, ur: 100 mg/dL — AB
SPECIFIC GRAVITY, URINE: 1.027 (ref 1.005–1.030)

## 2016-12-28 LAB — LIPASE, BLOOD: LIPASE: 43 U/L (ref 11–51)

## 2016-12-28 LAB — CBC
HCT: 43.1 % (ref 36.0–46.0)
Hemoglobin: 14.5 g/dL (ref 12.0–15.0)
MCH: 29.4 pg (ref 26.0–34.0)
MCHC: 33.6 g/dL (ref 30.0–36.0)
MCV: 87.2 fL (ref 78.0–100.0)
PLATELETS: 139 10*3/uL — AB (ref 150–400)
RBC: 4.94 MIL/uL (ref 3.87–5.11)
RDW: 13.2 % (ref 11.5–15.5)
WBC: 6.4 10*3/uL (ref 4.0–10.5)

## 2016-12-28 MED ORDER — CEPHALEXIN 500 MG PO CAPS: 500.0000 mg | ORAL_CAPSULE | Freq: Two times a day (BID) | ORAL | 0 refills | Status: AC

## 2016-12-28 NOTE — ED Provider Notes (Signed)
[facility] DEPT Provider Note   CSN: [id number] Arrival date & time: 12/28/16  1355     History   Chief Complaint Chief Complaint  Patient presents with  . Flank Pain    left     HPI Cheryl [name] is a 61 y.o. female.  HPI  Patient presents to ED for left upper quadrant pain that began about 18hrs ago. Pain is now resolved with Tylenol after lasting about 9hrs. No inciting event noted. No previous history of symptoms. Reports some dysuria. States she was recently diagnosed with liver mass and has MRI scheduled next week for further evaluation. Denies Fever, hematuria, vaginal complaints, diarrhea, constipation, nausea or vomiting.  Past Medical History:  Diagnosis Date  . History of blood clots   . Liver mass   . PONV (postoperative nausea and vomiting)    nausea    Patient Active Problem List   Diagnosis Date Noted  . Abnormal finding of diagnostic imaging     Past Surgical History:  Procedure Laterality Date  . ABDOMINAL HYSTERECTOMY     complete  . CESAREAN SECTION     x 1  . COLONOSCOPY WITH PROPOFOL N/A 12/24/2016   Procedure: COLONOSCOPY WITH PROPOFOL;  Surgeon: Milus Banister, MD;  Location: [facility];  Service: Endoscopy;  Laterality: N/A;  . ESOPHAGOGASTRODUODENOSCOPY (EGD) WITH PROPOFOL N/A 12/24/2016   Procedure: ESOPHAGOGASTRODUODENOSCOPY (EGD) WITH PROPOFOL;  Surgeon: Milus Banister, MD;  Location: [facility];  Service: Endoscopy;  Laterality: N/A;    OB History    No data available       Home Medications    Prior to Admission medications   Medication Sig Start Date End Date Taking? Authorizing Provider  acetaminophen (TYLENOL) 500 MG tablet Take 1,000 mg by mouth every 6 (six) hours as needed (for pain.).   Yes [provider]  warfarin (COUMADIN) 4 MG tablet Take 4 mg by mouth daily at 6 PM.    Yes [provider]  cephALEXin (KEFLEX) 500 MG capsule Take 1 capsule (500 mg total) by mouth 2 (two) times daily.  12/28/16 01/04/17  Delia Heady, PA-C    Family History Family History  Problem Relation Age of Onset  . Stomach cancer Mother   . Emphysema Father     Social History Social History  Substance Use Topics  . Smoking status: Former Smoker    Packs/day: 1.00    Years: 15.00    Types: Cigarettes  . Smokeless tobacco: Never Used     Comment: quit 2010  . Alcohol use No     Allergies   Dilaudid [hydromorphone hcl]; Sulfa antibiotics; and Tramadol   Review of Systems Review of Systems  Constitutional: Negative for appetite change, chills and fever.  HENT: Negative for ear pain, rhinorrhea, sneezing and sore throat.   Eyes: Negative for photophobia and visual disturbance.  Respiratory: Negative for cough, chest tightness, shortness of breath and wheezing.   Cardiovascular: Negative for chest pain and palpitations.  Gastrointestinal: Positive for abdominal pain. Negative for blood in stool, constipation, diarrhea, nausea and vomiting.  Genitourinary: Positive for dysuria. Negative for hematuria, urgency, vaginal bleeding, vaginal discharge and vaginal pain.  Musculoskeletal: Negative for myalgias.  Skin: Negative for rash.  Neurological: Negative for dizziness, weakness and light-headedness.     Physical Exam Updated Vital Signs BP (!) 145/86 (BP Location: Left Arm)   Pulse 77   Temp 98.9 F (37.2 C) (Oral)   Resp 14   Ht 5' 8.5" (1.74 m)  Wt 55 kg (121 lb 3 oz)   SpO2 100%   BMI 18.16 kg/m   Physical Exam  Constitutional: She appears well-developed and well-nourished. No distress.  HENT:  Head: Normocephalic and atraumatic.  Nose: Nose normal.  Eyes: Conjunctivae and EOM are normal. Left eye exhibits no discharge. No scleral icterus.  Neck: Normal range of motion. Neck supple.  Cardiovascular: Normal rate, regular rhythm, normal heart sounds and intact distal pulses.  Exam reveals no gallop and no friction rub.   No murmur heard. Pulmonary/Chest: Effort normal  and breath sounds normal. No respiratory distress.  Abdominal: Soft. Bowel sounds are normal. She exhibits no distension. There is no tenderness. There is no guarding.  No abdominal tenderness noted as patient states she is pain free.  Musculoskeletal: Normal range of motion. She exhibits no edema.  Neurological: She is alert. She exhibits normal muscle tone. Coordination normal.  Skin: Skin is warm and dry. No rash noted.  Psychiatric: She has a normal mood and affect.  Nursing note and vitals reviewed.    ED Treatments / Results  Labs (all labs ordered are listed, but only abnormal results are displayed) Labs Reviewed  COMPREHENSIVE METABOLIC PANEL - Abnormal; Notable for the following:       Result Value   Glucose, Bld 109 (*)    AST 963 (*)    ALT 1,053 (*)    Alkaline Phosphatase 322 (*)    Total Bilirubin 1.7 (*)    All other components within normal limits  CBC - Abnormal; Notable for the following:    Platelets 139 (*)    All other components within normal limits  URINALYSIS, ROUTINE W REFLEX MICROSCOPIC - Abnormal; Notable for the following:    Color, Urine AMBER (*)    APPearance HAZY (*)    Hgb urine dipstick LARGE (*)    Protein, ur 100 (*)    Leukocytes, UA MODERATE (*)    Bacteria, UA MANY (*)    Squamous Epithelial / LPF 0-5 (*)    All other components within normal limits  LIPASE, BLOOD    EKG  EKG Interpretation None       Radiology Ct Chest W Contrast  Result Date: 12/29/2016 CLINICAL DATA:  Right-sided abdominal pain with weight loss and fatigue for 3 months. Elevated LFTs. Liver metastases on recent MRI. EXAM: CT CHEST, ABDOMEN, AND PELVIS WITH CONTRAST TECHNIQUE: Multidetector CT imaging of the chest, abdomen and pelvis was performed following the standard protocol during bolus administration of intravenous contrast. CONTRAST:  153mL ISOVUE-300 IOPAMIDOL (ISOVUE-300) INJECTION 61% COMPARISON:  Abdominal MRI 12/17/2016 FINDINGS: CT CHEST FINDINGS  Cardiovascular: The heart size is normal.  No pericardial effusion. Mediastinum/Nodes: Numerous small lymph nodes are seen in the mediastinum. 8 mm short axis subcarinal lymph node is upper normal for size. 6 mm short axis paraesophageal lymph node is within normal limits. No evidence for hilar lymphadenopathy. The esophagus has normal imaging features. There is no axillary lymphadenopathy. Lungs/Pleura: No suspicious pulmonary nodule or mass. No focal airspace consolidation. No pulmonary edema or pleural effusion. Musculoskeletal: Bone windows reveal no worrisome lytic or sclerotic osseous lesions. CT ABDOMEN PELVIS FINDINGS Hepatobiliary: Innumerable rim enhancing low-density liver lesions are evident with a right hepatic predominance. One of the more dominant lesions seen on image 60 series 2 and measures 2.8 cm in diameter. Gallbladder is decompressed with wall irregularity in the body region. No intra or extrahepatic biliary duct dilatation. Pancreas: No focal mass lesion. No dilatation of the main  duct. No intraparenchymal cyst. No peripancreatic edema. Spleen: No splenomegaly. No focal mass lesion. Adrenals/Urinary Tract: No adrenal nodule or mass. Left mm well-defined low-density lesion identified upper pole right kidney with a 6 mm well-defined low-density lesion seen in the cortex of the right lower pole. Left kidney unremarkable. No evidence for hydroureter. The urinary bladder appears normal for the degree of distention. Stomach/Bowel: Stomach is nondistended. Mild fullness in the wall of the gastric antrum is symmetric and fairly characteristic for CT appearance. No evidence of outlet obstruction. Duodenum is normally positioned as is the ligament of Treitz. Vascular/Lymphatic: No abdominal aortic aneurysm. No abdominal aortic atherosclerotic calcification. Small gastrohepatic ligament lymph nodes are evident. 11 mm short axis hepatoduodenal ligament lymph node is seen on image 75. 9 mm short axis left  para-aortic lymph node visible on image 72. 11 mm short axis right common femoral lymph node identified. Reproductive: Uterus surgically absent.  There is no adnexal mass. Other: No intraperitoneal free fluid. Musculoskeletal: Bone windows reveal no worrisome lytic or sclerotic osseous lesions. IMPRESSION: 1. No definite CT findings to suggest primary source of the apparent hepatic metastases. Gallbladder is contracted and there is some wall irregularity in the body of the gallbladder but no gross gallbladder mass lesion is evident. No biliary dilatation. Previous MRI raise the question of the cecal lesion, but there is no evidence for gross cecal mass by CT today. There is some minimal fullness in the distal stomach, but this is a fairly typical appearance on CT and given the smooth appearance of the distal gastric wall and symmetric tissue fullness, neoplasm is felt to be not likely. 2. Hepatic metastases as seen on recent abdominal MRI. 3. Borderline to mild gastrohepatic, hepatoduodenal ligament, and retroperitoneal lymphadenopathy. 4. PET-CT may prove helpful to further evaluate. Electronically Signed   By: Misty Stanley M.D.   On: 12/29/2016 17:12   Ct Abdomen Pelvis W Contrast  Result Date: 12/29/2016 CLINICAL DATA:  Right-sided abdominal pain with weight loss and fatigue for 3 months. Elevated LFTs. Liver metastases on recent MRI. EXAM: CT CHEST, ABDOMEN, AND PELVIS WITH CONTRAST TECHNIQUE: Multidetector CT imaging of the chest, abdomen and pelvis was performed following the standard protocol during bolus administration of intravenous contrast. CONTRAST:  117mL ISOVUE-300 IOPAMIDOL (ISOVUE-300) INJECTION 61% COMPARISON:  Abdominal MRI 12/17/2016 FINDINGS: CT CHEST FINDINGS Cardiovascular: The heart size is normal.  No pericardial effusion. Mediastinum/Nodes: Numerous small lymph nodes are seen in the mediastinum. 8 mm short axis subcarinal lymph node is upper normal for size. 6 mm short axis  paraesophageal lymph node is within normal limits. No evidence for hilar lymphadenopathy. The esophagus has normal imaging features. There is no axillary lymphadenopathy. Lungs/Pleura: No suspicious pulmonary nodule or mass. No focal airspace consolidation. No pulmonary edema or pleural effusion. Musculoskeletal: Bone windows reveal no worrisome lytic or sclerotic osseous lesions. CT ABDOMEN PELVIS FINDINGS Hepatobiliary: Innumerable rim enhancing low-density liver lesions are evident with a right hepatic predominance. One of the more dominant lesions seen on image 60 series 2 and measures 2.8 cm in diameter. Gallbladder is decompressed with wall irregularity in the body region. No intra or extrahepatic biliary duct dilatation. Pancreas: No focal mass lesion. No dilatation of the main duct. No intraparenchymal cyst. No peripancreatic edema. Spleen: No splenomegaly. No focal mass lesion. Adrenals/Urinary Tract: No adrenal nodule or mass. Left mm well-defined low-density lesion identified upper pole right kidney with a 6 mm well-defined low-density lesion seen in the cortex of the right lower pole. Left kidney  unremarkable. No evidence for hydroureter. The urinary bladder appears normal for the degree of distention. Stomach/Bowel: Stomach is nondistended. Mild fullness in the wall of the gastric antrum is symmetric and fairly characteristic for CT appearance. No evidence of outlet obstruction. Duodenum is normally positioned as is the ligament of Treitz. Vascular/Lymphatic: No abdominal aortic aneurysm. No abdominal aortic atherosclerotic calcification. Small gastrohepatic ligament lymph nodes are evident. 11 mm short axis hepatoduodenal ligament lymph node is seen on image 75. 9 mm short axis left para-aortic lymph node visible on image 72. 11 mm short axis right common femoral lymph node identified. Reproductive: Uterus surgically absent.  There is no adnexal mass. Other: No intraperitoneal free fluid.  Musculoskeletal: Bone windows reveal no worrisome lytic or sclerotic osseous lesions. IMPRESSION: 1. No definite CT findings to suggest primary source of the apparent hepatic metastases. Gallbladder is contracted and there is some wall irregularity in the body of the gallbladder but no gross gallbladder mass lesion is evident. No biliary dilatation. Previous MRI raise the question of the cecal lesion, but there is no evidence for gross cecal mass by CT today. There is some minimal fullness in the distal stomach, but this is a fairly typical appearance on CT and given the smooth appearance of the distal gastric wall and symmetric tissue fullness, neoplasm is felt to be not likely. 2. Hepatic metastases as seen on recent abdominal MRI. 3. Borderline to mild gastrohepatic, hepatoduodenal ligament, and retroperitoneal lymphadenopathy. 4. PET-CT may prove helpful to further evaluate. Electronically Signed   By: Misty Stanley M.D.   On: 12/29/2016 17:12    Procedures Procedures (including critical care time)  Medications Ordered in ED Medications - No data to display   Initial Impression / Assessment and Plan / ED Course  I have reviewed the triage vital signs and the nursing notes.  Pertinent labs & imaging results that were available during my care of the patient were reviewed by me and considered in my medical decision making (see chart for details).     Patient presents with LUQ pain that resolved with Tylenol PTA and lasted about 9hrs. Has history of liver mass which she is receiving MRI for for further evaluation next week. On exam she is not tender to palpation in the abdomen, in any region. She is nontoxic appearing and does not appear to be in any pain or discomfort at this time. She states that her pain is completely resolved. Lipase and CBC unremarkable. CMP showed elevation in her LFTs. Knowing that the patient has a history of liver mass I suspect that this is either due to that or consistent  with her baseline. Urinalysis showed leukocytes, bacteria and white blood cell count elevated. Will treat with Keflex for UTI and advised patient to continue her home medications as previously prescribed. I advised her to follow-up with her PCP for further evaluation and imaging that is needed for her liver. Patient appears stable for discharge at this time with no other complaints. Strict return precautions given.  Final Clinical Impressions(s) / ED Diagnoses   Final diagnoses:  Flank pain  Lower urinary tract infectious disease    New Prescriptions Discharge Medication List as of 12/28/2016  7:02 PM    START taking these medications   Details  cephALEXin (KEFLEX) 500 MG capsule Take 1 capsule (500 mg total) by mouth 2 (two) times daily., Starting Mon 12/28/2016, Until Mon 01/04/2017, Print         [place], [place], [place] 12/30/16 1623  Davonna Belling, MD 12/30/16 [phone]

## 2016-12-28 NOTE — ED Triage Notes (Signed)
Pt from home with complaints of left sided flank pain and left sided upper abdominal pain. Pt was recently diagnosed with a liver mass and has had "lots of blood work and imaging done" pt has MRI coming up soon to visualize mass. Pt denies N/V/D. Pt states flank/abdominal pain began around 0100 6/25

## 2016-12-28 NOTE — Telephone Encounter (Signed)
Pt has been advised and will go to the ED now.

## 2016-12-28 NOTE — Telephone Encounter (Signed)
Pt woke up at 1 am with 10/10 generalized abd pain that radiated to the back. It lasted about 3 hours then took 1 extra strength tylenol at 9:30 am.  It does seem to have helped pain is now 5/10 when she takes a deep breath or moves a lot.  Lying on either side will cause pain.  No other symptoms aside from the abd pain.   MRI on 12/17/16 IMPRESSION: 1. Widespread hepatic metastasis, without abdominal primary identified. 2. Right twelfth rib metastasis. 3. Soft tissue fullness within the cecum, not entirely imaged. This could represent prominent stool or less likely a colon primary. Consider dedicated chest and pelvic CT or PET to evaluate for primary. 4. Suspicious right cardiophrenic angle adenopathy. Indeterminate/equivocal portal caval node. 5. Left adrenal adenoma. These results will be called to the ordering clinician or representative by the Radiologist Assistant, and communication documented in the PACS or zVision Dashboard.   Colon on 12/24/16 normal.   Please advise

## 2016-12-28 NOTE — Telephone Encounter (Signed)
I received a response from Dr Shelia Media regarding liver biopsy and coumadin.  She is ok to hold coumadin 4 days prior to the liver biopsy.  Pt has been notified and verbalized understanding.  Response to be scanned into EPIC

## 2016-12-28 NOTE — Discharge Instructions (Signed)
Take Keflex twice daily for 7 days. Follow-up with PCP for further evaluation. Return to ED for worsening pain, increased blood in urine, severe back pain, vomiting, bowel changes.

## 2016-12-28 NOTE — Telephone Encounter (Signed)
Cheryl [name], if it hurts for her to breath she needs to go to ED. Thanks

## 2016-12-29 ENCOUNTER — Ambulatory Visit (INDEPENDENT_AMBULATORY_CARE_PROVIDER_SITE_OTHER)
Admission: RE | Admit: 2016-12-29 | Discharge: 2016-12-29 | Disposition: A | Discharge status: Home / Self Care | Payer: BLUE CROSS/BLUE SHIELD | Source: Ambulatory Visit | Attending: Gastroenterology | Admitting: Gastroenterology

## 2016-12-29 ENCOUNTER — Encounter (HOSPITAL_COMMUNITY): Payer: Self-pay | Admitting: Gastroenterology

## 2016-12-29 DIAGNOSIS — D490 Neoplasm of unspecified behavior of digestive system: Secondary | ICD-10-CM

## 2016-12-29 DIAGNOSIS — C787 Secondary malignant neoplasm of liver and intrahepatic bile duct: Secondary | ICD-10-CM | POA: Diagnosis not present

## 2016-12-29 MED ORDER — IOPAMIDOL (ISOVUE-300) INJECTION 61%
100.0000 mL | Freq: Once | INTRAVENOUS | Status: AC | PRN
Start: 2016-12-29 — End: 2016-12-29
  Administered 2016-12-29: 100 mL via INTRAVENOUS

## 2016-12-31 ENCOUNTER — Other Ambulatory Visit: Payer: Self-pay | Admitting: Radiology

## 2017-01-01 ENCOUNTER — Ambulatory Visit (HOSPITAL_COMMUNITY)
Admission: RE | Admit: 2017-01-01 | Discharge: 2017-01-01 | Disposition: A | Discharge status: Home / Self Care | Payer: BLUE CROSS/BLUE SHIELD | Source: Ambulatory Visit | Attending: Gastroenterology | Admitting: Gastroenterology

## 2017-01-01 ENCOUNTER — Encounter (HOSPITAL_COMMUNITY): Payer: Self-pay

## 2017-01-01 DIAGNOSIS — C229 Malignant neoplasm of liver, not specified as primary or secondary: Secondary | ICD-10-CM | POA: Diagnosis not present

## 2017-01-01 DIAGNOSIS — C801 Malignant (primary) neoplasm, unspecified: Secondary | ICD-10-CM | POA: Diagnosis not present

## 2017-01-01 DIAGNOSIS — C7951 Secondary malignant neoplasm of bone: Secondary | ICD-10-CM | POA: Diagnosis not present

## 2017-01-01 DIAGNOSIS — C227 Other specified carcinomas of liver: Secondary | ICD-10-CM | POA: Diagnosis not present

## 2017-01-01 DIAGNOSIS — R7989 Other specified abnormal findings of blood chemistry: Secondary | ICD-10-CM | POA: Insufficient documentation

## 2017-01-01 DIAGNOSIS — C787 Secondary malignant neoplasm of liver and intrahepatic bile duct: Secondary | ICD-10-CM | POA: Diagnosis not present

## 2017-01-01 DIAGNOSIS — Z7901 Long term (current) use of anticoagulants: Secondary | ICD-10-CM | POA: Diagnosis not present

## 2017-01-01 DIAGNOSIS — R933 Abnormal findings on diagnostic imaging of other parts of digestive tract: Secondary | ICD-10-CM | POA: Diagnosis not present

## 2017-01-01 DIAGNOSIS — K769 Liver disease, unspecified: Secondary | ICD-10-CM | POA: Diagnosis not present

## 2017-01-01 DIAGNOSIS — D3502 Benign neoplasm of left adrenal gland: Secondary | ICD-10-CM | POA: Diagnosis not present

## 2017-01-01 DIAGNOSIS — R160 Hepatomegaly, not elsewhere classified: Secondary | ICD-10-CM | POA: Insufficient documentation

## 2017-01-01 DIAGNOSIS — Z87891 Personal history of nicotine dependence: Secondary | ICD-10-CM | POA: Insufficient documentation

## 2017-01-01 DIAGNOSIS — R590 Localized enlarged lymph nodes: Secondary | ICD-10-CM | POA: Diagnosis not present

## 2017-01-01 DIAGNOSIS — Z86718 Personal history of other venous thrombosis and embolism: Secondary | ICD-10-CM | POA: Insufficient documentation

## 2017-01-01 DIAGNOSIS — R634 Abnormal weight loss: Secondary | ICD-10-CM | POA: Diagnosis not present

## 2017-01-01 DIAGNOSIS — K7689 Other specified diseases of liver: Secondary | ICD-10-CM | POA: Diagnosis not present

## 2017-01-01 LAB — CBC
HCT: 40.6 % (ref 36.0–46.0)
Hemoglobin: 13.7 g/dL (ref 12.0–15.0)
MCH: 29.4 pg (ref 26.0–34.0)
MCHC: 33.7 g/dL (ref 30.0–36.0)
MCV: 87.1 fL (ref 78.0–100.0)
PLATELETS: 186 10*3/uL (ref 150–400)
RBC: 4.66 MIL/uL (ref 3.87–5.11)
RDW: 13.2 % (ref 11.5–15.5)
WBC: 9.5 10*3/uL (ref 4.0–10.5)

## 2017-01-01 LAB — APTT: aPTT: 34 seconds (ref 24–36)

## 2017-01-01 LAB — PROTIME-INR
INR: 1.06
PROTHROMBIN TIME: 13.8 s (ref 11.4–15.2)

## 2017-01-01 MED ORDER — FENTANYL CITRATE (PF) 100 MCG/2ML IJ SOLN
INTRAMUSCULAR | Status: AC | PRN
  Administered 2017-01-01 (×2): 50 ug via INTRAVENOUS

## 2017-01-01 MED ORDER — SODIUM CHLORIDE 0.9 % IV SOLN
INTRAVENOUS | Status: DC
  Administered 2017-01-01: 11:00:00 via INTRAVENOUS

## 2017-01-01 MED ORDER — MIDAZOLAM HCL 2 MG/2ML IJ SOLN
INTRAMUSCULAR | Status: AC | PRN
  Administered 2017-01-01 (×2): 1 mg via INTRAVENOUS

## 2017-01-01 MED ORDER — FENTANYL CITRATE (PF) 100 MCG/2ML IJ SOLN
INTRAMUSCULAR | Status: AC
  Filled 2017-01-01: qty 4

## 2017-01-01 MED ORDER — MIDAZOLAM HCL 2 MG/2ML IJ SOLN
INTRAMUSCULAR | Status: AC
  Filled 2017-01-01: qty 4

## 2017-01-01 MED ORDER — ONDANSETRON HCL 4 MG/2ML IJ SOLN
INTRAMUSCULAR | Status: AC
  Administered 2017-01-01: 4 mg via INTRAVENOUS
  Filled 2017-01-01: qty 2

## 2017-01-01 NOTE — Consult Note (Signed)
Chief Complaint: Patient was seen in consultation today for image guided liver lesion biopsy  Referring Physician(s): Jacobs,Daniel P  Supervising Physician: Corrie Mckusick  Patient Status: [facility] - Out-pt  History of Present Illness: Cheryl [name] is a 61 y.o. female with history of abdominal pain, weight loss, elevated liver function tests, recent negative colonoscopy and endoscopy  on 12/24/16. No documented history of cancer. She has had prior lower extremity DVTs, on Coumadin as outpatient. Recent MRI of the abdomen has revealed: 1. Widespread hepatic metastasis, without abdominal primary identified. 2. Right twelfth rib metastasis. 3. Soft tissue fullness within the cecum, not entirely imaged. This could represent prominent stool or less likely a colon primary. Consider dedicated chest and pelvic CT or PET to evaluate for primary. 4. Suspicious right cardiophrenic angle adenopathy. Indeterminate/equivocal portal caval node. 5. Left adrenal adenoma  Follow-up CT chest abdomen pelvis on 12/29/16 revealed:  1. No definite CT findings to suggest primary source of the apparent hepatic metastases. Gallbladder is contracted and there is some wall irregularity in the body of the gallbladder but no gross gallbladder mass lesion is evident. No biliary dilatation. Previous MRI raise the question of the cecal lesion, but there is no evidence for gross cecal mass by CT today. There is some minimal fullness in the distal stomach, but this is a fairly typical appearance on CT and given the smooth appearance of the distal gastric wall and symmetric tissue fullness, neoplasm is felt to be not likely. 2. Hepatic metastases as seen on recent abdominal MRI. 3. Borderline to mild gastrohepatic, hepatoduodenal ligament, and retroperitoneal lymphadenopathy  She presents today for image guided liver lesion biopsy for further evaluation. Past Medical History:  Diagnosis Date  . History of  blood clots   . Liver mass   . PONV (postoperative nausea and vomiting)    nausea    Past Surgical History:  Procedure Laterality Date  . ABDOMINAL HYSTERECTOMY     complete  . CESAREAN SECTION     x 1  . COLONOSCOPY WITH PROPOFOL N/A 12/24/2016   Procedure: COLONOSCOPY WITH PROPOFOL;  Surgeon: Milus Banister, MD;  Location: [facility];  Service: Endoscopy;  Laterality: N/A;  . ESOPHAGOGASTRODUODENOSCOPY (EGD) WITH PROPOFOL N/A 12/24/2016   Procedure: ESOPHAGOGASTRODUODENOSCOPY (EGD) WITH PROPOFOL;  Surgeon: Milus Banister, MD;  Location: [facility];  Service: Endoscopy;  Laterality: N/A;    Allergies: Dilaudid [hydromorphone hcl]; Sulfa antibiotics; and Tramadol  Medications: Prior to Admission medications   Medication Sig Start Date End Date Taking? Authorizing Provider  acetaminophen (TYLENOL) 500 MG tablet Take 1,000 mg by mouth every 6 (six) hours as needed (for pain.).   Yes [provider]  cephALEXin (KEFLEX) 500 MG capsule Take 1 capsule (500 mg total) by mouth 2 (two) times daily. 12/28/16 01/04/17 Yes Khatri, Hina, PA-C  warfarin (COUMADIN) 4 MG tablet Take 4 mg by mouth daily at 6 PM.     [provider]     Family History  Problem Relation Age of Onset  . Stomach cancer Mother   . Emphysema Father     Social History   Social History  . Marital status: Married    Spouse name: N/A  . Number of children: 1  . Years of education: N/A   Social History Main Topics  . Smoking status: Former Smoker    Packs/day: 1.00    Years: 15.00    Types: Cigarettes  . Smokeless tobacco: Never Used     Comment:  quit 2010  . Alcohol use No  . Drug use: No  . Sexual activity: Not Asked   Other Topics Concern  . None   Social History Narrative  . None      Review of Systems currently denies fever, headache, chest pain, dyspnea, cough, worsening abdominal pain, nausea, vomiting or abnormal bleeding. She has had a recent UTI and some weight  loss.  Vital Signs: BP 135/60 (BP Location: Left Arm)   Pulse 80   Temp 98 F (36.7 C) (Oral)   Resp 20   SpO2 100%   Physical Exam awake, alert. Chest clear to auscultation bilaterally. Heart with regular rate and rhythm. Abdomen soft, positive bowel sounds, currently nontender. No lower extremity edema.  Mallampati Score:     Imaging: Ct Chest W Contrast  Result Date: 12/29/2016 CLINICAL DATA:  Right-sided abdominal pain with weight loss and fatigue for 3 months. Elevated LFTs. Liver metastases on recent MRI. EXAM: CT CHEST, ABDOMEN, AND PELVIS WITH CONTRAST TECHNIQUE: Multidetector CT imaging of the chest, abdomen and pelvis was performed following the standard protocol during bolus administration of intravenous contrast. CONTRAST:  173mL ISOVUE-300 IOPAMIDOL (ISOVUE-300) INJECTION 61% COMPARISON:  Abdominal MRI 12/17/2016 FINDINGS: CT CHEST FINDINGS Cardiovascular: The heart size is normal.  No pericardial effusion. Mediastinum/Nodes: Numerous small lymph nodes are seen in the mediastinum. 8 mm short axis subcarinal lymph node is upper normal for size. 6 mm short axis paraesophageal lymph node is within normal limits. No evidence for hilar lymphadenopathy. The esophagus has normal imaging features. There is no axillary lymphadenopathy. Lungs/Pleura: No suspicious pulmonary nodule or mass. No focal airspace consolidation. No pulmonary edema or pleural effusion. Musculoskeletal: Bone windows reveal no worrisome lytic or sclerotic osseous lesions. CT ABDOMEN PELVIS FINDINGS Hepatobiliary: Innumerable rim enhancing low-density liver lesions are evident with a right hepatic predominance. One of the more dominant lesions seen on image 60 series 2 and measures 2.8 cm in diameter. Gallbladder is decompressed with wall irregularity in the body region. No intra or extrahepatic biliary duct dilatation. Pancreas: No focal mass lesion. No dilatation of the main duct. No intraparenchymal cyst. No  peripancreatic edema. Spleen: No splenomegaly. No focal mass lesion. Adrenals/Urinary Tract: No adrenal nodule or mass. Left mm well-defined low-density lesion identified upper pole right kidney with a 6 mm well-defined low-density lesion seen in the cortex of the right lower pole. Left kidney unremarkable. No evidence for hydroureter. The urinary bladder appears normal for the degree of distention. Stomach/Bowel: Stomach is nondistended. Mild fullness in the wall of the gastric antrum is symmetric and fairly characteristic for CT appearance. No evidence of outlet obstruction. Duodenum is normally positioned as is the ligament of Treitz. Vascular/Lymphatic: No abdominal aortic aneurysm. No abdominal aortic atherosclerotic calcification. Small gastrohepatic ligament lymph nodes are evident. 11 mm short axis hepatoduodenal ligament lymph node is seen on image 75. 9 mm short axis left para-aortic lymph node visible on image 72. 11 mm short axis right common femoral lymph node identified. Reproductive: Uterus surgically absent.  There is no adnexal mass. Other: No intraperitoneal free fluid. Musculoskeletal: Bone windows reveal no worrisome lytic or sclerotic osseous lesions. IMPRESSION: 1. No definite CT findings to suggest primary source of the apparent hepatic metastases. Gallbladder is contracted and there is some wall irregularity in the body of the gallbladder but no gross gallbladder mass lesion is evident. No biliary dilatation. Previous MRI raise the question of the cecal lesion, but there is no evidence for gross cecal mass by CT  today. There is some minimal fullness in the distal stomach, but this is a fairly typical appearance on CT and given the smooth appearance of the distal gastric wall and symmetric tissue fullness, neoplasm is felt to be not likely. 2. Hepatic metastases as seen on recent abdominal MRI. 3. Borderline to mild gastrohepatic, hepatoduodenal ligament, and retroperitoneal lymphadenopathy.  4. PET-CT may prove helpful to further evaluate. Electronically Signed   By: Misty Stanley M.D.   On: 12/29/2016 17:12   Ct Abdomen Pelvis W Contrast  Result Date: 12/29/2016 CLINICAL DATA:  Right-sided abdominal pain with weight loss and fatigue for 3 months. Elevated LFTs. Liver metastases on recent MRI. EXAM: CT CHEST, ABDOMEN, AND PELVIS WITH CONTRAST TECHNIQUE: Multidetector CT imaging of the chest, abdomen and pelvis was performed following the standard protocol during bolus administration of intravenous contrast. CONTRAST:  145mL ISOVUE-300 IOPAMIDOL (ISOVUE-300) INJECTION 61% COMPARISON:  Abdominal MRI 12/17/2016 FINDINGS: CT CHEST FINDINGS Cardiovascular: The heart size is normal.  No pericardial effusion. Mediastinum/Nodes: Numerous small lymph nodes are seen in the mediastinum. 8 mm short axis subcarinal lymph node is upper normal for size. 6 mm short axis paraesophageal lymph node is within normal limits. No evidence for hilar lymphadenopathy. The esophagus has normal imaging features. There is no axillary lymphadenopathy. Lungs/Pleura: No suspicious pulmonary nodule or mass. No focal airspace consolidation. No pulmonary edema or pleural effusion. Musculoskeletal: Bone windows reveal no worrisome lytic or sclerotic osseous lesions. CT ABDOMEN PELVIS FINDINGS Hepatobiliary: Innumerable rim enhancing low-density liver lesions are evident with a right hepatic predominance. One of the more dominant lesions seen on image 60 series 2 and measures 2.8 cm in diameter. Gallbladder is decompressed with wall irregularity in the body region. No intra or extrahepatic biliary duct dilatation. Pancreas: No focal mass lesion. No dilatation of the main duct. No intraparenchymal cyst. No peripancreatic edema. Spleen: No splenomegaly. No focal mass lesion. Adrenals/Urinary Tract: No adrenal nodule or mass. Left mm well-defined low-density lesion identified upper pole right kidney with a 6 mm well-defined low-density  lesion seen in the cortex of the right lower pole. Left kidney unremarkable. No evidence for hydroureter. The urinary bladder appears normal for the degree of distention. Stomach/Bowel: Stomach is nondistended. Mild fullness in the wall of the gastric antrum is symmetric and fairly characteristic for CT appearance. No evidence of outlet obstruction. Duodenum is normally positioned as is the ligament of Treitz. Vascular/Lymphatic: No abdominal aortic aneurysm. No abdominal aortic atherosclerotic calcification. Small gastrohepatic ligament lymph nodes are evident. 11 mm short axis hepatoduodenal ligament lymph node is seen on image 75. 9 mm short axis left para-aortic lymph node visible on image 72. 11 mm short axis right common femoral lymph node identified. Reproductive: Uterus surgically absent.  There is no adnexal mass. Other: No intraperitoneal free fluid. Musculoskeletal: Bone windows reveal no worrisome lytic or sclerotic osseous lesions. IMPRESSION: 1. No definite CT findings to suggest primary source of the apparent hepatic metastases. Gallbladder is contracted and there is some wall irregularity in the body of the gallbladder but no gross gallbladder mass lesion is evident. No biliary dilatation. Previous MRI raise the question of the cecal lesion, but there is no evidence for gross cecal mass by CT today. There is some minimal fullness in the distal stomach, but this is a fairly typical appearance on CT and given the smooth appearance of the distal gastric wall and symmetric tissue fullness, neoplasm is felt to be not likely. 2. Hepatic metastases as seen on recent abdominal MRI.  3. Borderline to mild gastrohepatic, hepatoduodenal ligament, and retroperitoneal lymphadenopathy. 4. PET-CT may prove helpful to further evaluate. Electronically Signed   By: Misty Stanley M.D.   On: 12/29/2016 17:12   Mr Abdomen Mrcp Moise Boring Contast  Result Date: 12/17/2016 CLINICAL DATA:  Generalized abdominal pain with  elevated liver function tests. EXAM: MRI ABDOMEN WITHOUT AND WITH CONTRAST (INCLUDING MRCP) TECHNIQUE: Multiplanar multisequence MR imaging of the abdomen was performed both before and after the administration of intravenous contrast. Heavily T2-weighted images of the biliary and pancreatic ducts were obtained, and three-dimensional MRCP images were rendered by post processing. CONTRAST:  17mL MULTIHANCE GADOBENATE DIMEGLUMINE 529 MG/ML IV SOLN Creatinine was obtained on site at [facility] at [address] Results: Creatinine 0.7 mg/dL. COMPARISON:  CT 12/29/2012 FINDINGS: Lower chest: Normal heart size without pericardial or pleural effusion. Right cardiophrenic angle adenopathy is new at 8 mm on image 17/series 22. Hepatobiliary: Extensive bilateral but significantly right greater than left hepatic metastatic disease. Example in the anterior right hepatic lobe a 2.5 x 2.1 cm on image 26/series 2. More inferiorly, an ill-defined dominant mass measures 5.2 x 6.8 cm on the right hepatic lobe. A 7 mm high left hepatic lobe lesion on image 23/series 22. Normal gallbladder, without biliary ductal dilatation. Pancreas:  Normal, without mass or ductal dilatation. Spleen:  Normal in size, without focal abnormality. Adrenals/Urinary Tract: A left adrenal nodule measures 9 mm but demonstrates signal dropout on out of phase image 16/ series 7, most consistent with an adenoma. Normal right adrenal gland. Bilateral too small to characterize renal lesions. No hydronephrosis. Stomach/Bowel: Normal stomach and abdominal small bowel loops. Soft tissue fullness within the cecum on image 19/series 4 is incompletely imaged on other pulse sequences. Vascular/Lymphatic: Normal caliber of the aorta and branch vessels. Portal caval node measures 10 mm on image 57/series 22 and is not pathologic by size criteria. This is slightly increased in size compared to 12/29/2012. Other: No ascites. No evidence of omental or  peritoneal metastasis. Musculoskeletal: posteromedial right twelfth rib focus of hyper enhancement and restricted diffusion, including on image 24/series 22. IMPRESSION: 1. Widespread hepatic metastasis, without abdominal primary identified. 2. Right twelfth rib metastasis. 3. Soft tissue fullness within the cecum, not entirely imaged. This could represent prominent stool or less likely a colon primary. Consider dedicated chest and pelvic CT or PET to evaluate for primary. 4. Suspicious right cardiophrenic angle adenopathy. Indeterminate/equivocal portal caval node. 5. Left adrenal adenoma. These results will be called to the ordering clinician or representative by the Radiologist Assistant, and communication documented in the PACS or zVision Dashboard. Electronically Signed   By: Abigail Miyamoto M.D.   On: 12/17/2016 10:39    Labs:  CBC:  Recent Labs  12/28/16 1615 01/01/17 1116  WBC 6.4 9.5  HGB 14.5 13.7  HCT 43.1 40.6  PLT 139* 186    COAGS:  Recent Labs  01/01/17 1116  INR 1.06  APTT 34    BMP:  Recent Labs  12/25/16 1115 12/28/16 1615  NA  --  139  K  --  3.9  CL  --  101  CO2  --  28  GLUCOSE  --  109*  BUN 15 13  CALCIUM  --  9.4  CREATININE 0.78 0.67  GFRNONAA  --  >60  GFRAA  --  >60    LIVER FUNCTION TESTS:  Recent Labs  12/01/16 1037 12/28/16 1615  BILITOT 0.6 1.7*  AST 42* 963*  ALT 43* 1,053*  ALKPHOS 200* 322*  PROT 7.9 8.0  ALBUMIN 4.3 4.0    TUMOR MARKERS: No results for input(s): AFPTM, CEA, CA199, CHROMGRNA in the last 8760 hours.  Assessment and Plan: Patient with recent history of abdominal pain, elevated LFTs, weight loss, prior lower extremity DVTs on Coumadin as outpatient, recent negative endoscopy/colonoscopy ,recent imaging revealing widespread hepatic metastasis of unknown primary, right 12th rib metastasis, left adrenal adenoma, mild gastrohepatic, hepatoduodenal ligament and retroperitoneal lymphadenopathy. She presents today  for image guided liver lesion biopsy for further evaluation.Risks and benefits discussed with the patient including, but not limited to bleeding, infection, damage to adjacent structures or low yield requiring additional tests. All of the patient's questions were answered, patient is agreeable to proceed. Consent signed and in chart.     Thank you for this interesting consult.  I greatly enjoyed meeting TIARRAH SAVILLE and look forward to participating in their care.  A copy of this report was sent to the requesting provider on this date.  Electronically Signed: D. Rowe Robert, PA-C 01/01/2017, 12:43 PM   I spent a total of  20 minutes   in face to face in clinical consultation, greater than 50% of which was counseling/coordinating care for image guided liver lesion biopsy

## 2017-01-01 NOTE — Procedures (Addendum)
Interventional Radiology Procedure Note  Procedure: US guided liver mass biopsy.  Complications: None Recommendations:  - Ok to shower tomorrow - Do not submerge for 7 days - Routine wound care - 2 hour recovery -advance diet   Signed,  Dulcy Fanny. Earleen Newport, DO

## 2017-01-01 NOTE — Discharge Instructions (Signed)
Liver Biopsy, Care After °These instructions give you information on caring for yourself after your procedure. Your doctor may also give you more specific instructions. Call your doctor if you have any problems or questions after your procedure. °Follow these instructions at home: °· Rest at home for 1-2 days or as told by your doctor. °· Have someone stay with you for at least 24 hours. °· Do not do these things in the first 24 hours: °? Drive. °? Use machinery. °? Take care of other people. °? Sign legal documents. °? Take a bath or shower. °· There are many different ways to close and cover a cut (incision). For example, a cut can be closed with stitches, skin glue, or adhesive strips. Follow your doctor's instructions on: °? Taking care of your cut. °? Changing and removing your bandage (dressing). °? Removing whatever was used to close your cut. °· Do not drink alcohol in the first week. °· Do not lift more than 5 pounds or play contact sports for the first 2 weeks. °· Take medicines only as told by your doctor. For 1 week, do not take medicine that has aspirin in it or medicines like ibuprofen. °· Get your test results. °Contact a doctor if: °· A cut bleeds and leaves more than just a small spot of blood. °· A cut is red, puffs up (swells), or hurts more than before. °· Fluid or something else comes from a cut. °· A cut smells bad. °· You have a fever or chills. °Get help right away if: °· You have swelling, bloating, or pain in your belly (abdomen). °· You get dizzy or faint. °· You have a rash. °· You feel sick to your stomach (nauseous) or throw up (vomit). °· You have trouble breathing, feel short of breath, or feel faint. °· Your chest hurts. °· You have problems talking or seeing. °· You have trouble balancing or moving your arms or legs. °This information is not intended to replace advice given to you by your health care provider. Make sure you discuss any questions you have with your health care  provider. °Document Released: 03/31/2008 Document Revised: 11/28/2015 Document Reviewed: 08/18/2013 °Elsevier Interactive Patient Education © 2018 [employer] ° ° ° ° °Moderate Conscious Sedation, Adult, Care After °These instructions provide you with information about caring for yourself after your procedure. Your health care provider may also give you more specific instructions. Your treatment has been planned according to current medical practices, but problems sometimes occur. Call your health care provider if you have any problems or questions after your procedure. °What can I expect after the procedure? °After your procedure, it is common: °· To feel sleepy for several hours. °· To feel clumsy and have poor balance for several hours. °· To have poor judgment for several hours. °· To vomit if you eat too soon. ° °Follow these instructions at home: °For at least 24 hours after the procedure: ° °· Do not: °? Participate in activities where you could fall or become injured. °? Drive. °? Use heavy machinery. °? Drink alcohol. °? Take sleeping pills or medicines that cause drowsiness. °? Make important decisions or sign legal documents. °? Take care of children on your own. °· Rest. °Eating and drinking °· Follow the diet recommended by your health care provider. °· If you vomit: °? Drink water, juice, or soup when you can drink without vomiting. °? Make sure you have little or no nausea before eating solid foods. °General instructions °·   Have a responsible adult stay with you until you are awake and alert. °· Take over-the-counter and prescription medicines only as told by your health care provider. °· If you smoke, do not smoke without supervision. °· Keep all follow-up visits as told by your health care provider. This is important. °Contact a health care provider if: °· You keep feeling nauseous or you keep vomiting. °· You feel light-headed. °· You develop a rash. °· You have a fever. °Get help right away  if: °· You have trouble breathing. °This information is not intended to replace advice given to you by your health care provider. Make sure you discuss any questions you have with your health care provider. °Document Released: 04/12/2013 Document Revised: 11/25/2015 Document Reviewed: 10/12/2015 °Elsevier Interactive Patient Education © 2018 [employer] Inc[employer] ° ° °

## 2017-01-04 ENCOUNTER — Telehealth: Payer: Self-pay | Admitting: Gastroenterology

## 2017-01-04 DIAGNOSIS — Z9889 Other specified postprocedural states: Secondary | ICD-10-CM

## 2017-01-04 NOTE — Progress Notes (Signed)
Patient ID: Cheryl [name], female   DOB: 08-29-1955, 61 y.o.   MRN: [id number] Pt called in to report pain in RUQ and back post liver bx 6/29. Tylenol did not give her much relief. Advised her to go to ED especially if symptoms worsen or persists. She is on coumadin for DVT.

## 2017-01-04 NOTE — Telephone Encounter (Signed)
The pt will go to the ED if pain worsens overnight and will come in tomorrow for labs

## 2017-01-04 NOTE — Telephone Encounter (Signed)
Pt had a liver biopsy on Friday and Saturday began to have soreness on the right side of the abd.  No nausea or fever.  Resumed taking coumadin on Saturday.  Tylenol 500 mg every 4 hours with some relief.  She is concerned and wanted to make sure it is nothing worrisome.  I did tell her that some soreness after a liver biopsy is expected.  I will forward to Dr Ardis Hughs for review.

## 2017-01-04 NOTE — Telephone Encounter (Signed)
I agree with plan.  Also if pain worsens overnight she needs to go to ER. Lets get CBC in the morning. Thanks

## 2017-01-05 ENCOUNTER — Other Ambulatory Visit (INDEPENDENT_AMBULATORY_CARE_PROVIDER_SITE_OTHER): Payer: BLUE CROSS/BLUE SHIELD

## 2017-01-05 DIAGNOSIS — Z9889 Other specified postprocedural states: Secondary | ICD-10-CM

## 2017-01-05 LAB — CBC WITH DIFFERENTIAL/PLATELET
BASOS ABS: 0.1 10*3/uL (ref 0.0–0.1)
BASOS PCT: 0.7 % (ref 0.0–3.0)
EOS ABS: 0.1 10*3/uL (ref 0.0–0.7)
Eosinophils Relative: 1 % (ref 0.0–5.0)
HCT: 41.8 % (ref 36.0–46.0)
HEMOGLOBIN: 14 g/dL (ref 12.0–15.0)
Lymphocytes Relative: 19.4 % (ref 12.0–46.0)
Lymphs Abs: 2.1 10*3/uL (ref 0.7–4.0)
MCHC: 33.6 g/dL (ref 30.0–36.0)
MCV: 87.6 fl (ref 78.0–100.0)
MONO ABS: 0.8 10*3/uL (ref 0.1–1.0)
Monocytes Relative: 7.2 % (ref 3.0–12.0)
Neutro Abs: 7.7 10*3/uL (ref 1.4–7.7)
Neutrophils Relative %: 71.7 % (ref 43.0–77.0)
Platelets: 286 10*3/uL (ref 150.0–400.0)
RBC: 4.77 Mil/uL (ref 3.87–5.11)
RDW: 13.4 % (ref 11.5–15.5)
WBC: 10.8 10*3/uL — AB (ref 4.0–10.5)

## 2017-01-07 ENCOUNTER — Encounter: Payer: Self-pay | Admitting: Hematology

## 2017-01-08 ENCOUNTER — Telehealth: Payer: Self-pay | Admitting: Hematology

## 2017-01-08 ENCOUNTER — Encounter: Payer: Self-pay | Admitting: Hematology

## 2017-01-08 NOTE — Telephone Encounter (Signed)
Appt has been scheduled for the pt to see Dr. Burr Medico on 7/10 at 130pm. Pt aware to arrive 30 minutes early. Demographics verified. Letter mailed .

## 2017-01-11 NOTE — Progress Notes (Signed)
Cheryl [name]  [phone] [phone]  Clinic New Consult Note   Patient Care Team: Deland Pretty, MD as PCP - General (Internal Medicine) 01/12/2017  Referring physician: Dr. 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 Widespread hepatic metastasis, without abdominal primary identified. 2. Right twelfth rib metastasis. 3. Soft tissue fullness within the cecum, not entirely imaged. This could represent prominent stool or less likely a colon primary. Consider dedicated chest and pelvic CT or PET to evaluate for primary. 4. Suspicious right cardiophrenic angle adenopathy. Indeterminate/equivocal portal caval node. 5. Left adrenal adenoma. These results will be called to the ordering clinician or representative by the Radiologist Assistant, and communication documented in the PACS or zVision Dashboard.      12/24/2016 Procedure    Upper Endoscopy Nodular distal gastritis, biospied.      12/24/2016 Procedure    Colonoscopy  - The entire examined colon is normal on direct and retroflexion views. - No polyps or cancers.      12/24/2016 Pathology Results     Diagnosis Stomach, biopsy, distal - REACTIVE GASTROPATHY - NO MALIGNANCY IDENTIFIED      12/29/2016 Imaging    CT CAP w contrast  IMPRESSION: 1. No definite CT findings to suggest primary source of the apparent hepatic metastases. Gallbladder is contracted and there is some wall irregularity in the body of the gallbladder but no gross  gallbladder mass lesion is evident. No biliary dilatation. Previous MRI raise the question of the cecal lesion, but there is no evidence for gross cecal mass by CT today. There is some minimal fullness in the distal stomach, but this is a fairly typical appearance on CT and given the smooth appearance of the distal gastric wall and symmetric tissue fullness, neoplasm is felt to be not likely. 2. Hepatic metastases as seen on recent abdominal MRI. 3. Borderline to mild gastrohepatic, hepatoduodenal ligament, and retroperitoneal lymphadenopathy.  4. PET-CT may prove helpful to further evaluate.      01/01/2017 Imaging    US Biopsy  IMPRESSION: Status post ultrasound-guided biopsy of right liver tumor. Tissue specimen sent to pathology for complete histopathologic analysis.      01/01/2017 Procedure    Ultrasound-guided liver biopsy      01/01/2017 Pathology Results    Liver biopsy showed adenocarcinoma, consistent with cholangiocarcinoma.       01/01/2017 Initial Diagnosis    Cholangiocarcinoma metastatic to liver ([facility])       HISTORY OF PRESENTING ILLNESS:  Cheryl [name] 61 y.o. female is here because of newly diagnosed Cholangiocarcinoma. She was referred by her gastroenterologist Dr. Ardis Hughs. She presents to my clinic with her husband today.  She has had vague, intermittent abdominal pain, and a total of 10 pound weight loss since December. She was originally everted by her primary care physician, and lab test showed elevated liver function tests. She underwent abdominal MRI with and without contrast on 12/17/2016, which showed wide spread hepatic metastasis, without abdominal primary identified. Soft tissue fullness within the cecum, indeterminate. She was referred to Dr. Ardis Hughs,  and had negative colonoscopy and endoscopy on 12/24/2016. She underwent ultrasound guided liver biopsy on 01/01/2017, pathology showed adenocarcinoma, consistent with glandular carcinoma.   She is on  chronic Coumadin due to prior lower extremity DVTs.   She reports some pain that started worsened days after her biopsy. She has nausea commonly. She is eating well. She reports fatigue and constipation and she takes [place] to help. Her bowel movements are about every 3 days. She denies chest pain, cough or SOB. She does mammograms normally with her most recent one being in June 2017.   She denies any history of cancer.   MEDICAL HISTORY:  Past Medical History:  Diagnosis Date  . History of blood clots   . Liver mass   . PONV (postoperative nausea and vomiting)    nausea    SURGICAL HISTORY: Past Surgical History:  Procedure Laterality Date  . ABDOMINAL HYSTERECTOMY     complete  . CESAREAN SECTION     x 1  . COLONOSCOPY WITH PROPOFOL N/A 12/24/2016   Procedure: COLONOSCOPY WITH PROPOFOL;  Surgeon: Milus Banister, MD;  Location: [facility];  Service: Endoscopy;  Laterality: N/A;  . ESOPHAGOGASTRODUODENOSCOPY (EGD) WITH PROPOFOL N/A 12/24/2016   Procedure: ESOPHAGOGASTRODUODENOSCOPY (EGD) WITH PROPOFOL;  Surgeon: Milus Banister, MD;  Location: [facility];  Service: Endoscopy;  Laterality: N/A;    SOCIAL HISTORY: Social History   Social History  . Marital status: Married    Spouse name: N/A  . Number of children: 1  . Years of education: N/A   Occupational History  . Not on file.   Social History Main Topics  . Smoking status: Former Smoker    Packs/day: 1.00    Years: 15.00    Types: Cigarettes  . Smokeless tobacco: Never Used     Comment: quit 2010  . Alcohol use No  . Drug use: No  . Sexual activity: Not on file   Other Topics Concern  . Not on file   Social History Narrative  . No narrative on file    FAMILY HISTORY: Family History  Problem Relation Age of Onset  . Stomach cancer Mother   . Emphysema Father     ALLERGIES:  is allergic to dilaudid [hydromorphone hcl]; sulfa antibiotics; and tramadol.  MEDICATIONS:  Current Outpatient  Prescriptions  Medication Sig Dispense Refill  . acetaminophen (TYLENOL) 500 MG tablet Take 1,000 mg by mouth every 6 (six) hours as needed (for pain.).    Marland Kitchen traMADol (ULTRAM) 50 MG tablet Take 1 tablet (50 mg total) by mouth every 6 (six) hours as needed. 30 tablet 1  . warfarin (COUMADIN) 4 MG tablet Take 4 mg by mouth daily at 6 PM.      No current facility-administered medications for this visit.     REVIEW OF SYSTEMS:   Constitutional: Denies fevers, chills or abnormal night sweats (+)fatigue Eyes: Denies blurriness of vision, double vision or watery eyes Ears, nose, mouth, throat, and face: Denies mucositis or sore throat Respiratory: Denies cough, dyspnea or wheezes Cardiovascular: Denies palpitation, chest discomfort or lower extremity swelling Gastrointestinal:  Denies nausea, heartburn or change in bowel habits Skin: Denies abnormal skin rashes Lymphatics: Denies new lymphadenopathy or easy bruising Neurological:Denies numbness, tingling or new weaknesses Behavioral/Psych: Mood is stable, no new changes  MSK: (+)right flank pain All other systems were reviewed with the patient and are negative.  PHYSICAL EXAMINATION:  ECOG PERFORMANCE STATUS: 1 - Symptomatic but completely ambulatory  Vitals:   01/12/17 1316  BP: (!) 156/94  Pulse: 81  Resp: 20  Temp: 97.8 F (36.6 C)   Filed Weights   01/12/17 1316  Weight: 134 lb 11.2 oz (61.1 kg)    GENERAL:alert, no distress and comfortable SKIN: skin color, texture, turgor are normal, no rashes or significant lesions EYES: normal, conjunctiva are pink and non-injected, sclera clear OROPHARYNX:no exudate, no erythema and lips, buccal mucosa, and tongue normal  NECK: supple, thyroid normal size, non-tender, without nodularity LYMPH:  no palpable lymphadenopathy in the cervical, axillary or inguinal LUNGS: clear to auscultation and percussion with normal breathing effort HEART: regular rate & rhythm and no murmurs and no  lower extremity edema ABDOMEN:abdomen soft, non-tender and normal bowel sounds Musculoskeletal:no cyanosis of digits and no clubbing  PSYCH: alert & oriented x 3 with fluent speech NEURO: no focal motor/sensory deficits   LABORATORY DATA:  I have reviewed the data as listed CBC Latest Ref Rng & Units 01/05/2017 01/01/2017 12/28/2016  WBC 4.0 - 10.5 K/uL 10.8(H) 9.5 6.4  Hemoglobin 12.0 - 15.0 g/dL 14.0 13.7 14.5  Hematocrit 36.0 - 46.0 % 41.8 40.6 43.1  Platelets 150.0 - 400.0 K/uL 286.0 186 139(L)   PATHOLOGY: Diagnosis 12/24/2016 Stomach, biopsy, distal - REACTIVE GASTROPATHY - NO MALIGNANCY IDENTIFIED  Diagnosis 12/31/2016 Liver, needle/core biopsy ADENOCARCINOMA, CONSISTENT WITH CHOLANGIOCARCINOMA.  RADIOGRAPHIC STUDIES: I have personally reviewed the radiological images as listed and agreed with the findings in the report. Ct Chest W Contrast  Result Date: 12/29/2016 CLINICAL DATA:  Right-sided abdominal pain with weight loss and fatigue for 3 months. Elevated LFTs. Liver metastases on recent MRI. EXAM: CT CHEST, ABDOMEN, AND PELVIS WITH CONTRAST TECHNIQUE: Multidetector CT imaging of the chest, abdomen and pelvis was performed following the standard protocol during bolus administration of intravenous contrast. CONTRAST:  129mL ISOVUE-300 IOPAMIDOL (ISOVUE-300) INJECTION 61% COMPARISON:  Abdominal MRI 12/17/2016 FINDINGS: CT CHEST FINDINGS Cardiovascular: The heart size is normal.  No pericardial effusion. Mediastinum/Nodes: Numerous small lymph nodes are seen in the mediastinum. 8 mm short axis subcarinal lymph node is upper normal for size. 6 mm short axis paraesophageal lymph node is within normal limits. No evidence for hilar lymphadenopathy. The esophagus has normal imaging features. There is no axillary lymphadenopathy. Lungs/Pleura: No suspicious pulmonary nodule or mass. No focal airspace consolidation. No pulmonary edema or pleural effusion. Musculoskeletal: Bone windows reveal  no worrisome lytic or sclerotic osseous lesions. CT ABDOMEN PELVIS FINDINGS Hepatobiliary: Innumerable rim enhancing low-density liver lesions are evident with a right hepatic predominance. One of the more dominant lesions seen on image 60 series 2 and measures 2.8 cm in diameter. Gallbladder is decompressed with wall irregularity in the body region. No intra or extrahepatic biliary duct dilatation. Pancreas: No focal mass lesion. No dilatation of the main duct. No intraparenchymal cyst. No peripancreatic edema. Spleen: No splenomegaly. No focal mass lesion. Adrenals/Urinary Tract: No adrenal nodule or mass. Left mm well-defined low-density lesion identified upper pole right kidney with a 6 mm well-defined low-density lesion seen in the cortex of the right lower pole. Left kidney unremarkable. No evidence for hydroureter. The urinary bladder appears normal for the degree of distention. Stomach/Bowel: Stomach is nondistended. Mild fullness in the wall of the gastric antrum is symmetric and fairly characteristic for CT appearance. No evidence of outlet obstruction. Duodenum is normally positioned as is the ligament of Treitz. Vascular/Lymphatic: No abdominal aortic aneurysm. No abdominal aortic atherosclerotic calcification. Small gastrohepatic ligament lymph nodes are evident. 11 mm short axis hepatoduodenal ligament lymph node is seen  on image 75. 9 mm short axis left para-aortic lymph node visible on image 72. 11 mm short axis right common femoral lymph node identified. Reproductive: Uterus surgically absent.  There is no adnexal mass. Other: No intraperitoneal free fluid. Musculoskeletal: Bone windows reveal no worrisome lytic or sclerotic osseous lesions. IMPRESSION: 1. No definite CT findings to suggest primary source of the apparent hepatic metastases. Gallbladder is contracted and there is some wall irregularity in the body of the gallbladder but no gross gallbladder mass lesion is evident. No biliary  dilatation. Previous MRI raise the question of the cecal lesion, but there is no evidence for gross cecal mass by CT today. There is some minimal fullness in the distal stomach, but this is a fairly typical appearance on CT and given the smooth appearance of the distal gastric wall and symmetric tissue fullness, neoplasm is felt to be not likely. 2. Hepatic metastases as seen on recent abdominal MRI. 3. Borderline to mild gastrohepatic, hepatoduodenal ligament, and retroperitoneal lymphadenopathy. 4. PET-CT may prove helpful to further evaluate. Electronically Signed   By: Misty Stanley M.D.   On: 12/29/2016 17:12   Ct Abdomen Pelvis W Contrast  Result Date: 12/29/2016 CLINICAL DATA:  Right-sided abdominal pain with weight loss and fatigue for 3 months. Elevated LFTs. Liver metastases on recent MRI. EXAM: CT CHEST, ABDOMEN, AND PELVIS WITH CONTRAST TECHNIQUE: Multidetector CT imaging of the chest, abdomen and pelvis was performed following the standard protocol during bolus administration of intravenous contrast. CONTRAST:  130mL ISOVUE-300 IOPAMIDOL (ISOVUE-300) INJECTION 61% COMPARISON:  Abdominal MRI 12/17/2016 FINDINGS: CT CHEST FINDINGS Cardiovascular: The heart size is normal.  No pericardial effusion. Mediastinum/Nodes: Numerous small lymph nodes are seen in the mediastinum. 8 mm short axis subcarinal lymph node is upper normal for size. 6 mm short axis paraesophageal lymph node is within normal limits. No evidence for hilar lymphadenopathy. The esophagus has normal imaging features. There is no axillary lymphadenopathy. Lungs/Pleura: No suspicious pulmonary nodule or mass. No focal airspace consolidation. No pulmonary edema or pleural effusion. Musculoskeletal: Bone windows reveal no worrisome lytic or sclerotic osseous lesions. CT ABDOMEN PELVIS FINDINGS Hepatobiliary: Innumerable rim enhancing low-density liver lesions are evident with a right hepatic predominance. One of the more dominant lesions seen  on image 60 series 2 and measures 2.8 cm in diameter. Gallbladder is decompressed with wall irregularity in the body region. No intra or extrahepatic biliary duct dilatation. Pancreas: No focal mass lesion. No dilatation of the main duct. No intraparenchymal cyst. No peripancreatic edema. Spleen: No splenomegaly. No focal mass lesion. Adrenals/Urinary Tract: No adrenal nodule or mass. Left mm well-defined low-density lesion identified upper pole right kidney with a 6 mm well-defined low-density lesion seen in the cortex of the right lower pole. Left kidney unremarkable. No evidence for hydroureter. The urinary bladder appears normal for the degree of distention. Stomach/Bowel: Stomach is nondistended. Mild fullness in the wall of the gastric antrum is symmetric and fairly characteristic for CT appearance. No evidence of outlet obstruction. Duodenum is normally positioned as is the ligament of Treitz. Vascular/Lymphatic: No abdominal aortic aneurysm. No abdominal aortic atherosclerotic calcification. Small gastrohepatic ligament lymph nodes are evident. 11 mm short axis hepatoduodenal ligament lymph node is seen on image 75. 9 mm short axis left para-aortic lymph node visible on image 72. 11 mm short axis right common femoral lymph node identified. Reproductive: Uterus surgically absent.  There is no adnexal mass. Other: No intraperitoneal free fluid. Musculoskeletal: Bone windows reveal no worrisome lytic or sclerotic  osseous lesions. IMPRESSION: 1. No definite CT findings to suggest primary source of the apparent hepatic metastases. Gallbladder is contracted and there is some wall irregularity in the body of the gallbladder but no gross gallbladder mass lesion is evident. No biliary dilatation. Previous MRI raise the question of the cecal lesion, but there is no evidence for gross cecal mass by CT today. There is some minimal fullness in the distal stomach, but this is a fairly typical appearance on CT and given  the smooth appearance of the distal gastric wall and symmetric tissue fullness, neoplasm is felt to be not likely. 2. Hepatic metastases as seen on recent abdominal MRI. 3. Borderline to mild gastrohepatic, hepatoduodenal ligament, and retroperitoneal lymphadenopathy. 4. PET-CT may prove helpful to further evaluate. Electronically Signed   By: Misty Stanley M.D.   On: 12/29/2016 17:12   US Biopsy  Result Date: 01/01/2017 INDICATION: 61 year old female with a history of liver masses. EXAM: ULTRASOUND-GUIDED BIOPSY LIVER MASS MEDICATIONS: None. ANESTHESIA/SEDATION: Moderate (conscious) sedation was employed during this procedure. A total of Versed 2.0 mg and Fentanyl 100 mcg was administered intravenously. 4 mg IV Zofran Moderate Sedation Time: 20 minutes. The patient's level of consciousness and vital signs were monitored continuously by radiology nursing throughout the procedure under my direct supervision. FLUOROSCOPY TIME:  None COMPLICATIONS: None PROCEDURE: Informed written consent was obtained from the patient after a thorough discussion of the procedural risks, benefits and alternatives. All questions were addressed. Maximal Sterile Barrier Technique was utilized including caps, mask, sterile gowns, sterile gloves, sterile drape, hand hygiene and skin antiseptic. A timeout was performed prior to the initiation of the procedure. Patient was positioned supine position on the ultrasound table. Scout images of the upper at were stored sent to PACs. The patient has right upper abdomen was prepped and draped in the usual sterile fashion. The skin and subcutaneous tissues were generously infiltrated 1% lidocaine for local anesthesia. Using ultrasound guidance, 17 gauge guide needle was advanced into the lesion of the right liver lobe. Dilator was removed and multiple 18 gauge core biopsy were achieved and placed into formalin solution. Two Gel-Foam pledgets were placed. Needle was removed and a final image was  stored. Patient tolerated the procedure well and remained hemodynamically stable throughout. No complications were encountered and no significant blood loss. IMPRESSION: Status post ultrasound-guided biopsy of right liver tumor. Tissue specimen sent to pathology for complete histopathologic analysis. Signed, Dulcy Fanny. Earleen Newport, DO [facility] Radiology Electronically Signed   By: Corrie Mckusick D.O.   On: 01/01/2017 17:19   Mr Abdomen Mrcp Moise Boring Contast  Result Date: 12/17/2016 CLINICAL DATA:  Generalized abdominal pain with elevated liver function tests. EXAM: MRI ABDOMEN WITHOUT AND WITH CONTRAST (INCLUDING MRCP) TECHNIQUE: Multiplanar multisequence MR imaging of the abdomen was performed both before and after the administration of intravenous contrast. Heavily T2-weighted images of the biliary and pancreatic ducts were obtained, and three-dimensional MRCP images were rendered by post processing. CONTRAST:  35mL MULTIHANCE GADOBENATE DIMEGLUMINE 529 MG/ML IV SOLN Creatinine was obtained on site at [place] at [address] Results: Creatinine 0.7 mg/dL. COMPARISON:  CT 12/29/2012 FINDINGS: Lower chest: Normal heart size without pericardial or pleural effusion. Right cardiophrenic angle adenopathy is new at 8 mm on image 17/series 22. Hepatobiliary: Extensive bilateral but significantly right greater than left hepatic metastatic disease. Example in the anterior right hepatic lobe a 2.5 x 2.1 cm on image 26/series 2. More inferiorly, an ill-defined dominant  mass measures 5.2 x 6.8 cm on the right hepatic lobe. A 7 mm high left hepatic lobe lesion on image 23/series 22. Normal gallbladder, without biliary ductal dilatation. Pancreas:  Normal, without mass or ductal dilatation. Spleen:  Normal in size, without focal abnormality. Adrenals/Urinary Tract: A left adrenal nodule measures 9 mm but demonstrates signal dropout on out of phase image 16/ series 7,  most consistent with an adenoma. Normal right adrenal gland. Bilateral too small to characterize renal lesions. No hydronephrosis. Stomach/Bowel: Normal stomach and abdominal small bowel loops. Soft tissue fullness within the cecum on image 19/series 4 is incompletely imaged on other pulse sequences. Vascular/Lymphatic: Normal caliber of the aorta and branch vessels. Portal caval node measures 10 mm on image 57/series 22 and is not pathologic by size criteria. This is slightly increased in size compared to 12/29/2012. Other: No ascites. No evidence of omental or peritoneal metastasis. Musculoskeletal: posteromedial right twelfth rib focus of hyper enhancement and restricted diffusion, including on image 24/series 22. IMPRESSION: 1. Widespread hepatic metastasis, without abdominal primary identified. 2. Right twelfth rib metastasis. 3. Soft tissue fullness within the cecum, not entirely imaged. This could represent prominent stool or less likely a colon primary. Consider dedicated chest and pelvic CT or PET to evaluate for primary. 4. Suspicious right cardiophrenic angle adenopathy. Indeterminate/equivocal portal caval node. 5. Left adrenal adenoma. These results will be called to the ordering clinician or representative by the Radiologist Assistant, and communication documented in the PACS or zVision Dashboard. Electronically Signed   By: Abigail Miyamoto M.D.   On: 12/17/2016 10:39    ASSESSMENT & PLAN: 61 year old female, with past medical history of DVT on Coumadin, presented with intermittent abdominal pain, and 10 pound weight loss.  1. Intrahepatic cholangiocarcinoma in right lobe, multifocal disease vs liver metastasis   -I reviewed her CT, MRI scan findings and liver biopsy result with patient and her husband in details. -Her liver biopsy confirmed adenocarcinoma, consistent with cholangiocarcinoma. Her EGD and endoscopy was negative for primary tumor. CT chest, abdomen and pelvis was negative for other  primary or metastasis. -Her case was reviewed in our GI tumor Board, the T12 metastasis reported by MRI was not highly suspicious for bone metastasis, local splenic findings on the CT scan, after we reviewed in the tumor board. -She has extensive multifocal disease in the right lobe of the liver, or liver metastasis. Some of cystic lesions in her liver are probably cysts. A few small lesion in the left lobe of liver is indeterminate. -I recommend a scan for further staging, rule out left lobe metastasis or distant metastasis. -Dr. Barry Dienes has reviewed her case in the GI tumor Board, if PET scan is negative other than the cancer in right lobe, she may consider portal vein embolization by IR, and consider right lobe resection. I will refer her to Dr. Barry Dienes  -if her tumor is unresectable, then I will offer palliative chemotherapy, likely cisplatin and gemcitabine. -If she undergo surgical resection, she would have high risk for recurrence due to the extensive disease. I will recommend adjuvant chemotherapy with Xeloda after complete surgical resection. - due to increased risk of blood clots, I strongly encouraged her to continue taking Coumadin. She should be careful about cutting herself or falling.   2. Constipation - I encouraged her to drink more water or try prune juice   3. Right flank pain - She has been trying Tylenol once daily - I will give her a prescription of Tramadol. She should  try to eat before taking to avoid nausea   PLAN - repeat lab today including CA19.9  -PET scan  -lab and f/u after PET  -refer to Dr. Barry Dienes   No orders of the defined types were placed in this encounter.   All questions were answered. The patient knows to call the clinic with any problems, questions or concerns. I spent 55 minutes counseling the patient face to face. The total time spent in the appointment was 60 minutes and more than 50% was on counseling.     Truitt Merle, MD 01/12/2017

## 2017-01-12 ENCOUNTER — Ambulatory Visit (HOSPITAL_BASED_OUTPATIENT_CLINIC_OR_DEPARTMENT_OTHER): Payer: BLUE CROSS/BLUE SHIELD | Admitting: Hematology

## 2017-01-12 ENCOUNTER — Encounter: Payer: Self-pay | Admitting: Hematology

## 2017-01-12 DIAGNOSIS — Z86718 Personal history of other venous thrombosis and embolism: Secondary | ICD-10-CM | POA: Diagnosis not present

## 2017-01-12 DIAGNOSIS — C221 Intrahepatic bile duct carcinoma: Secondary | ICD-10-CM

## 2017-01-12 DIAGNOSIS — R109 Unspecified abdominal pain: Secondary | ICD-10-CM | POA: Diagnosis not present

## 2017-01-12 DIAGNOSIS — C787 Secondary malignant neoplasm of liver and intrahepatic bile duct: Secondary | ICD-10-CM

## 2017-01-12 DIAGNOSIS — Z7901 Long term (current) use of anticoagulants: Secondary | ICD-10-CM | POA: Diagnosis not present

## 2017-01-12 DIAGNOSIS — K5900 Constipation, unspecified: Secondary | ICD-10-CM | POA: Diagnosis not present

## 2017-01-12 MED ORDER — TRAMADOL HCL 50 MG PO TABS: 50.0000 mg | ORAL_TABLET | Freq: Four times a day (QID) | ORAL | 1 refills | Status: AC | PRN

## 2017-01-15 ENCOUNTER — Encounter: Payer: BLUE CROSS/BLUE SHIELD | Admitting: Gastroenterology

## 2017-01-17 ENCOUNTER — Encounter: Payer: Self-pay | Admitting: Hematology

## 2017-01-19 NOTE — Progress Notes (Signed)
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 Widespread hepatic metastasis, without abdominal primary identified. 2. Right twelfth rib metastasis. 3. Soft tissue fullness within the cecum, not entirely imaged. This could represent prominent stool or less likely a colon primary. Consider dedicated chest and pelvic CT or PET to evaluate for primary. 4. Suspicious right cardiophrenic angle adenopathy. Indeterminate/equivocal portal caval node. 5. Left adrenal adenoma. These results will be called to the ordering clinician or representative by the Radiologist Assistant, and communication documented in the PACS or zVision Dashboard.      12/24/2016 Procedure    Upper Endoscopy Nodular distal gastritis, biospied.      12/24/2016 Procedure    Colonoscopy  - The entire examined colon is normal on direct and retroflexion views. - No polyps or cancers.      12/24/2016 Pathology Results     Diagnosis Stomach, biopsy, distal - REACTIVE GASTROPATHY - NO MALIGNANCY IDENTIFIED      12/29/2016 Imaging    CT CAP w contrast  IMPRESSION: 1. No definite CT findings to suggest primary source of the apparent hepatic metastases. Gallbladder is  contracted and there is some wall irregularity in the body of the gallbladder but no gross gallbladder mass lesion is evident. No biliary dilatation. Previous MRI raise the question of the cecal lesion, but there is no evidence for gross cecal mass by CT today. There is some minimal fullness in the distal stomach, but this is a fairly typical appearance on CT and given the smooth appearance of the distal gastric wall and symmetric tissue fullness, neoplasm is felt to be not likely. 2. Hepatic metastases as seen on recent abdominal MRI. 3. Borderline to mild gastrohepatic, hepatoduodenal ligament, and retroperitoneal lymphadenopathy.  4. PET-CT may prove helpful to further evaluate.      01/01/2017 Imaging    US Biopsy  IMPRESSION: Status post ultrasound-guided biopsy of right liver tumor. Tissue specimen sent to pathology for complete histopathologic analysis.      01/01/2017 Procedure    Ultrasound-guided liver biopsy      01/01/2017 Pathology Results    Liver biopsy showed adenocarcinoma, consistent with cholangiocarcinoma.       01/01/2017 Initial Diagnosis    Cholangiocarcinoma metastatic to liver [facility])       HISTORY OF PRESENTING ILLNESS (01/12/17):  Cheryl [name] 61 y.o. female is here because of newly diagnosed Cholangiocarcinoma. She was referred by her gastroenterologist Dr. Ardis Hughs. She presents to my clinic with her husband today.  She has had vague, intermittent abdominal pain, and a total of 10 pound weight loss since December. She was originally everted by her primary care physician, and lab test showed elevated liver function tests. She underwent abdominal MRI with and without contrast on 12/17/2016, which showed wide spread hepatic metastasis,  without abdominal primary identified. Soft tissue fullness within the cecum, indeterminate. She was referred to Dr. Ardis Hughs, and had negative colonoscopy and endoscopy on 12/24/2016. She underwent ultrasound guided liver biopsy  on 01/01/2017, pathology showed adenocarcinoma, consistent with glandular carcinoma.   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   She reports some pain that started worsened days after her biopsy. She has nausea commonly. She is eating well. She reports fatigue and constipation and she takes [place] to help. Her bowel movements are about every 3 days. She denies chest pain, cough or SOB. She does mammograms normally with her most recent one being in June 2017.   She denies any history of cancer.   CURRENT THERAPY: pending   INTERVAL HISTORY: Cheryl [name] returns to the clinic today for follow up. She has not had her PET scan yet, or seen Dr. Barry Dienes. She did have lab work today. She reports feeling nauseated, and requests medication for this. She reports a good appetite despite her nausea. She also reports fatigue. She denies pain.   MEDICAL HISTORY:  Past Medical History:  Diagnosis Date  . History of blood clots   . Liver mass   . PONV (postoperative nausea and vomiting)    nausea    SURGICAL HISTORY: Past Surgical History:  Procedure Laterality Date  . ABDOMINAL HYSTERECTOMY     complete  . CESAREAN SECTION     x 1  . COLONOSCOPY WITH PROPOFOL N/A 12/24/2016   Procedure: COLONOSCOPY WITH PROPOFOL;  Surgeon: Milus Banister, MD;  Location: [facility];  Service: Endoscopy;  Laterality: N/A;  . ESOPHAGOGASTRODUODENOSCOPY (EGD) WITH PROPOFOL N/A 12/24/2016   Procedure: ESOPHAGOGASTRODUODENOSCOPY (EGD) WITH PROPOFOL;  Surgeon: Milus Banister, MD;  Location: [facility];  Service: Endoscopy;  Laterality: N/A;    SOCIAL HISTORY: Social History   Social History  . Marital status: Married    Spouse name: N/A  . Number of children: 1  . Years of education: N/A   Occupational History  . Not on file.   Social History Main Topics  . Smoking status: Former Smoker    Packs/day: 1.00    Years: 15.00    Types: Cigarettes  . Smokeless tobacco: Never Used       Comment: quit 2010  . Alcohol use No  . Drug use: No  . Sexual activity: Not on file   Other Topics Concern  . Not on file   Social History Narrative  . No narrative on file    FAMILY HISTORY: Family History  Problem Relation Age of Onset  . Stomach cancer Mother   . Emphysema Father     ALLERGIES:  is allergic to dilaudid [hydromorphone hcl]; sulfa antibiotics; and tramadol.  MEDICATIONS:  Current Outpatient Prescriptions  Medication Sig Dispense Refill  . acetaminophen (TYLENOL) 500 MG tablet Take 1,000 mg by mouth every 6 (six) hours as needed (for pain.).    Marland Kitchen prochlorperazine (COMPAZINE) 10 MG tablet Take 1 tablet (10 mg total) by mouth every 6 (six) hours as needed for nausea or vomiting. 30 tablet 2  . traMADol (ULTRAM) 50 MG tablet Take 1 tablet (50 mg total) by mouth every 6 (six) hours as needed. 30 tablet 1  . warfarin (COUMADIN) 4 MG tablet Take 4 mg by mouth daily at 6 PM.      No current facility-administered medications for this visit.     REVIEW OF SYSTEMS:  Constitutional: Denies fevers, chills or abnormal night sweats (+)  fatigue Eyes: Denies blurriness of vision, double vision or watery eyes Ears, nose, mouth, throat, and face: Denies mucositis or sore throat Respiratory: Denies cough, dyspnea or wheezes Cardiovascular: Denies palpitation, chest discomfort or lower extremity swelling Gastrointestinal:  Denies heartburn or change in bowel habits, (+) nausea Skin: Denies abnormal skin rashes Lymphatics: Denies new lymphadenopathy or easy bruising Neurological:Denies numbness, tingling or new weaknesses Behavioral/Psych: Mood is stable, no new changes  MSK: (+) right flank pain All other systems were reviewed with the patient and are negative.  PHYSICAL EXAMINATION:  ECOG PERFORMANCE STATUS: 1 - Symptomatic but completely ambulatory  Vitals:   01/22/17 1153  BP: (!) 141/86  Pulse: 73  Resp: 20  Temp: 97.7 F (36.5 C)   Filed Weights    01/22/17 1153  Weight: 134 lb 8 oz (61 kg)    GENERAL:alert, no distress and comfortable SKIN: skin color, texture, turgor are normal, no rashes or significant lesions EYES: normal, conjunctiva are pink and non-injected, sclera clear OROPHARYNX:no exudate, no erythema and lips, buccal mucosa, and tongue normal  NECK: supple, thyroid normal size, non-tender, without nodularity LYMPH:  no palpable lymphadenopathy in the cervical, axillary or inguinal LUNGS: clear to auscultation and percussion with normal breathing effort HEART: regular rate & rhythm and no murmurs and no lower extremity edema ABDOMEN:abdomen soft, non-tender and normal bowel sounds Musculoskeletal:no cyanosis of digits and no clubbing  PSYCH: alert & oriented x 3 with fluent speech NEURO: no focal motor/sensory deficits   LABORATORY DATA:  I have reviewed the data as listed CBC Latest Ref Rng & Units 01/22/2017 01/05/2017 01/01/2017  WBC 3.9 - 10.3 10e3/uL 10.1 10.8(H) 9.5  Hemoglobin 11.6 - 15.9 g/dL 13.6 14.0 13.7  Hematocrit 34.8 - 46.6 % 41.3 41.8 40.6  Platelets 145 - 400 10e3/uL 186 286.0 186   CA 19.9 PENDING TODAY, 01/22/17  PATHOLOGY: Diagnosis 12/24/2016 Stomach, biopsy, distal - REACTIVE GASTROPATHY - NO MALIGNANCY IDENTIFIED  Diagnosis 12/31/2016 Liver, needle/core biopsy ADENOCARCINOMA, CONSISTENT WITH CHOLANGIOCARCINOMA.  RADIOGRAPHIC STUDIES: I have personally reviewed the radiological images as listed and agreed with the findings in the report. Ct Chest W Contrast  Result Date: 12/29/2016 CLINICAL DATA:  Right-sided abdominal pain with weight loss and fatigue for 3 months. Elevated LFTs. Liver metastases on recent MRI. EXAM: CT CHEST, ABDOMEN, AND PELVIS WITH CONTRAST TECHNIQUE: Multidetector CT imaging of the chest, abdomen and pelvis was performed following the standard protocol during bolus administration of intravenous contrast. CONTRAST:  128mL ISOVUE-300 IOPAMIDOL (ISOVUE-300) INJECTION 61%  COMPARISON:  Abdominal MRI 12/17/2016 FINDINGS: CT CHEST FINDINGS Cardiovascular: The heart size is normal.  No pericardial effusion. Mediastinum/Nodes: Numerous small lymph nodes are seen in the mediastinum. 8 mm short axis subcarinal lymph node is upper normal for size. 6 mm short axis paraesophageal lymph node is within normal limits. No evidence for hilar lymphadenopathy. The esophagus has normal imaging features. There is no axillary lymphadenopathy. Lungs/Pleura: No suspicious pulmonary nodule or mass. No focal airspace consolidation. No pulmonary edema or pleural effusion. Musculoskeletal: Bone windows reveal no worrisome lytic or sclerotic osseous lesions. CT ABDOMEN PELVIS FINDINGS Hepatobiliary: Innumerable rim enhancing low-density liver lesions are evident with a right hepatic predominance. One of the more dominant lesions seen on image 60 series 2 and measures 2.8 cm in diameter. Gallbladder is decompressed with wall irregularity in the body region. No intra or extrahepatic biliary duct dilatation. Pancreas: No focal mass lesion. No dilatation of the main duct. No intraparenchymal cyst. No peripancreatic edema. Spleen: No splenomegaly. No  focal mass lesion. Adrenals/Urinary Tract: No adrenal nodule or mass. Left mm well-defined low-density lesion identified upper pole right kidney with a 6 mm well-defined low-density lesion seen in the cortex of the right lower pole. Left kidney unremarkable. No evidence for hydroureter. The urinary bladder appears normal for the degree of distention. Stomach/Bowel: Stomach is nondistended. Mild fullness in the wall of the gastric antrum is symmetric and fairly characteristic for CT appearance. No evidence of outlet obstruction. Duodenum is normally positioned as is the ligament of Treitz. Vascular/Lymphatic: No abdominal aortic aneurysm. No abdominal aortic atherosclerotic calcification. Small gastrohepatic ligament lymph nodes are evident. 11 mm short axis  hepatoduodenal ligament lymph node is seen on image 75. 9 mm short axis left para-aortic lymph node visible on image 72. 11 mm short axis right common femoral lymph node identified. Reproductive: Uterus surgically absent.  There is no adnexal mass. Other: No intraperitoneal free fluid. Musculoskeletal: Bone windows reveal no worrisome lytic or sclerotic osseous lesions. IMPRESSION: 1. No definite CT findings to suggest primary source of the apparent hepatic metastases. Gallbladder is contracted and there is some wall irregularity in the body of the gallbladder but no gross gallbladder mass lesion is evident. No biliary dilatation. Previous MRI raise the question of the cecal lesion, but there is no evidence for gross cecal mass by CT today. There is some minimal fullness in the distal stomach, but this is a fairly typical appearance on CT and given the smooth appearance of the distal gastric wall and symmetric tissue fullness, neoplasm is felt to be not likely. 2. Hepatic metastases as seen on recent abdominal MRI. 3. Borderline to mild gastrohepatic, hepatoduodenal ligament, and retroperitoneal lymphadenopathy. 4. PET-CT may prove helpful to further evaluate. Electronically Signed   By: Misty Stanley M.D.   On: 12/29/2016 17:12   Ct Abdomen Pelvis W Contrast  Result Date: 12/29/2016 CLINICAL DATA:  Right-sided abdominal pain with weight loss and fatigue for 3 months. Elevated LFTs. Liver metastases on recent MRI. EXAM: CT CHEST, ABDOMEN, AND PELVIS WITH CONTRAST TECHNIQUE: Multidetector CT imaging of the chest, abdomen and pelvis was performed following the standard protocol during bolus administration of intravenous contrast. CONTRAST:  182mL ISOVUE-300 IOPAMIDOL (ISOVUE-300) INJECTION 61% COMPARISON:  Abdominal MRI 12/17/2016 FINDINGS: CT CHEST FINDINGS Cardiovascular: The heart size is normal.  No pericardial effusion. Mediastinum/Nodes: Numerous small lymph nodes are seen in the mediastinum. 8 mm short axis  subcarinal lymph node is upper normal for size. 6 mm short axis paraesophageal lymph node is within normal limits. No evidence for hilar lymphadenopathy. The esophagus has normal imaging features. There is no axillary lymphadenopathy. Lungs/Pleura: No suspicious pulmonary nodule or mass. No focal airspace consolidation. No pulmonary edema or pleural effusion. Musculoskeletal: Bone windows reveal no worrisome lytic or sclerotic osseous lesions. CT ABDOMEN PELVIS FINDINGS Hepatobiliary: Innumerable rim enhancing low-density liver lesions are evident with a right hepatic predominance. One of the more dominant lesions seen on image 60 series 2 and measures 2.8 cm in diameter. Gallbladder is decompressed with wall irregularity in the body region. No intra or extrahepatic biliary duct dilatation. Pancreas: No focal mass lesion. No dilatation of the main duct. No intraparenchymal cyst. No peripancreatic edema. Spleen: No splenomegaly. No focal mass lesion. Adrenals/Urinary Tract: No adrenal nodule or mass. Left mm well-defined low-density lesion identified upper pole right kidney with a 6 mm well-defined low-density lesion seen in the cortex of the right lower pole. Left kidney unremarkable. No evidence for hydroureter. The urinary bladder appears normal for  the degree of distention. Stomach/Bowel: Stomach is nondistended. Mild fullness in the wall of the gastric antrum is symmetric and fairly characteristic for CT appearance. No evidence of outlet obstruction. Duodenum is normally positioned as is the ligament of Treitz. Vascular/Lymphatic: No abdominal aortic aneurysm. No abdominal aortic atherosclerotic calcification. Small gastrohepatic ligament lymph nodes are evident. 11 mm short axis hepatoduodenal ligament lymph node is seen on image 75. 9 mm short axis left para-aortic lymph node visible on image 72. 11 mm short axis right common femoral lymph node identified. Reproductive: Uterus surgically absent.  There is no  adnexal mass. Other: No intraperitoneal free fluid. Musculoskeletal: Bone windows reveal no worrisome lytic or sclerotic osseous lesions. IMPRESSION: 1. No definite CT findings to suggest primary source of the apparent hepatic metastases. Gallbladder is contracted and there is some wall irregularity in the body of the gallbladder but no gross gallbladder mass lesion is evident. No biliary dilatation. Previous MRI raise the question of the cecal lesion, but there is no evidence for gross cecal mass by CT today. There is some minimal fullness in the distal stomach, but this is a fairly typical appearance on CT and given the smooth appearance of the distal gastric wall and symmetric tissue fullness, neoplasm is felt to be not likely. 2. Hepatic metastases as seen on recent abdominal MRI. 3. Borderline to mild gastrohepatic, hepatoduodenal ligament, and retroperitoneal lymphadenopathy. 4. PET-CT may prove helpful to further evaluate. Electronically Signed   By: Misty Stanley M.D.   On: 12/29/2016 17:12   US Biopsy  Result Date: 01/01/2017 INDICATION: 61 year old female with a history of liver masses. EXAM: ULTRASOUND-GUIDED BIOPSY LIVER MASS MEDICATIONS: None. ANESTHESIA/SEDATION: Moderate (conscious) sedation was employed during this procedure. A total of Versed 2.0 mg and Fentanyl 100 mcg was administered intravenously. 4 mg IV Zofran Moderate Sedation Time: 20 minutes. The patient's level of consciousness and vital signs were monitored continuously by radiology nursing throughout the procedure under my direct supervision. FLUOROSCOPY TIME:  None COMPLICATIONS: None PROCEDURE: Informed written consent was obtained from the patient after a thorough discussion of the procedural risks, benefits and alternatives. All questions were addressed. Maximal Sterile Barrier Technique was utilized including caps, mask, sterile gowns, sterile gloves, sterile drape, hand hygiene and skin antiseptic. A timeout was performed prior  to the initiation of the procedure. Patient was positioned supine position on the ultrasound table. Scout images of the upper at were stored sent to PACs. The patient has right upper abdomen was prepped and draped in the usual sterile fashion. The skin and subcutaneous tissues were generously infiltrated 1% lidocaine for local anesthesia. Using ultrasound guidance, 17 gauge guide needle was advanced into the lesion of the right liver lobe. Dilator was removed and multiple 18 gauge core biopsy were achieved and placed into formalin solution. Two Gel-Foam pledgets were placed. Needle was removed and a final image was stored. Patient tolerated the procedure well and remained hemodynamically stable throughout. No complications were encountered and no significant blood loss. IMPRESSION: Status post ultrasound-guided biopsy of right liver tumor. Tissue specimen sent to pathology for complete histopathologic analysis. Signed, Dulcy Fanny. Earleen Newport, DO [facility] Electronically Signed   By: Corrie Mckusick D.O.   On: 01/01/2017 17:19    ASSESSMENT & PLAN: 61 year old female, with past medical history of DVT on Coumadin, presented with intermittent abdominal pain, and 10 pound weight loss.  1. Intrahepatic cholangiocarcinoma in right lobe, multifocal disease vs liver metastasis   -I previously reviewed  her CT, MRI scan findings and liver biopsy result with patient and her husband in details. -Her liver biopsy confirmed adenocarcinoma, consistent with cholangiocarcinoma. Her EGD and endoscopy was negative for primary tumor. CT chest, abdomen and pelvis was negative for other primary or metastasis. -CA19.9 was checked today, result is still pending  -Her case was previously reviewed in our GI tumor Board, the T12 metastasis reported by MRI was not highly suspicious for bone metastasis, local splenic findings on the CT scan, after we reviewed in the tumor board. -She  has extensive multifocal disease in the right lobe of the liver, or liver metastasis. Some of cystic lesions in her liver are probably cysts. A few small lesion in the left lobe of liver is indeterminate. -She is scheduled for staging PET scan next week July 25 -Dr. Barry Dienes has reviewed her case in the GI tumor Board, if PET scan is negative other than the cancer in right lobe, she may consider portal vein embolization by IR, and consider right lobe resection. I did refer her to Dr. Barry Dienes but she has not been scheduled yet.  -if her tumor is unresectable, then I will offer palliative chemotherapy, likely cisplatin and gemcitabine. -If she undergo surgical resection, she would have high risk for recurrence due to the extensive disease. I will recommend adjuvant chemotherapy with Xeloda after complete surgical resection. - due to increased risk of blood clots, I strongly encouraged her to continue taking Coumadin. She should be careful about cutting herself or falling. - labs reviewed today, her transaminitis has significantly improved today  - Proceed with PET scan next week. - Patient declines offer of referral for nutrition consult today, 01/22/17.   2. Constipation - I encouraged her to drink more water or try prune juice   3. Right flank pain - She has been trying Tylenol once daily - I will give her a prescription of Tramadol. She should try to eat before taking to avoid nausea.  4. Nausea - I will prescribe compazine for nausea - Patient still has a good appetite   PLAN - Labs reviewed today, transaminitis has significantly improved - Prescribe compazine today for nausea - PET scan scheduled for next week. I will call patient to review results. - Patient will consult with Dr. Barry Dienes soon, I'll send another message to Dr. Barry Dienes, and I also encourage patient to call Dr. Marlowe Aschoff office. - Follow up after PET and consult with Dr. Barry Dienes.  Orders Placed This Encounter  Procedures  .  CBC with Differential    Standing Status:   Standing    Number of Occurrences:   30    Standing Expiration Date:   01/22/2022  . Comprehensive metabolic panel    Standing Status:   Standing    Number of Occurrences:   30    Standing Expiration Date:   01/22/2022  . CA 19.9    Standing Status:   Standing    Number of Occurrences:   30    Standing Expiration Date:   01/22/2022    All questions were answered. The patient knows to call the clinic with any problems, questions or concerns. I spent 15 minutes counseling the patient face to face. The total time spent in the appointment was 20 minutes and more than 50% was on counseling.  This document serves as a record of services personally performed by Truitt Merle, MD. It was created on her behalf by Maryla Morrow, a trained medical scribe. The creation of this record  is based on the scribe's personal observations and the provider's statements to them. This document has been checked and approved by the attending provider.    Truitt Merle, MD 01/22/2017

## 2017-01-22 ENCOUNTER — Other Ambulatory Visit (HOSPITAL_BASED_OUTPATIENT_CLINIC_OR_DEPARTMENT_OTHER): Payer: BLUE CROSS/BLUE SHIELD

## 2017-01-22 ENCOUNTER — Ambulatory Visit (HOSPITAL_BASED_OUTPATIENT_CLINIC_OR_DEPARTMENT_OTHER): Payer: BLUE CROSS/BLUE SHIELD | Admitting: Hematology

## 2017-01-22 VITALS — BP 141/86 | HR 73 | Temp 97.7°F | Resp 20 | Ht 68.5 in | Wt 134.5 lb

## 2017-01-22 DIAGNOSIS — R110 Nausea: Secondary | ICD-10-CM | POA: Diagnosis not present

## 2017-01-22 DIAGNOSIS — C221 Intrahepatic bile duct carcinoma: Secondary | ICD-10-CM | POA: Diagnosis not present

## 2017-01-22 DIAGNOSIS — C787 Secondary malignant neoplasm of liver and intrahepatic bile duct: Secondary | ICD-10-CM | POA: Diagnosis not present

## 2017-01-22 LAB — COMPREHENSIVE METABOLIC PANEL
ALBUMIN: 3.4 g/dL — AB (ref 3.5–5.0)
ALK PHOS: 273 U/L — AB (ref 40–150)
ALT: 35 U/L (ref 0–55)
ANION GAP: 9 meq/L (ref 3–11)
AST: 42 U/L — ABNORMAL HIGH (ref 5–34)
BILIRUBIN TOTAL: 0.88 mg/dL (ref 0.20–1.20)
BUN: 16.2 mg/dL (ref 7.0–26.0)
CALCIUM: 9.9 mg/dL (ref 8.4–10.4)
CO2: 28 mEq/L (ref 22–29)
Chloride: 103 mEq/L (ref 98–109)
Creatinine: 0.8 mg/dL (ref 0.6–1.1)
EGFR: 84 mL/min/{1.73_m2} — AB (ref >90)
GLUCOSE: 90 mg/dL (ref 70–140)
Potassium: 4.9 mEq/L (ref 3.5–5.1)
SODIUM: 141 meq/L (ref 136–145)
TOTAL PROTEIN: 7.6 g/dL (ref 6.4–8.3)

## 2017-01-22 LAB — CBC WITH DIFFERENTIAL/PLATELET
BASO%: 0.2 % (ref 0.0–2.0)
BASOS ABS: 0 10*3/uL (ref 0.0–0.1)
EOS ABS: 0.1 10*3/uL (ref 0.0–0.5)
EOS%: 1.1 % (ref 0.0–7.0)
HEMATOCRIT: 41.3 % (ref 34.8–46.6)
HGB: 13.6 g/dL (ref 11.6–15.9)
LYMPH#: 1.8 10*3/uL (ref 0.9–3.3)
LYMPH%: 17.7 % (ref 14.0–49.7)
MCH: 29.1 pg (ref 25.1–34.0)
MCHC: 32.9 g/dL (ref 31.5–36.0)
MCV: 88.4 fL (ref 79.5–101.0)
MONO#: 0.6 10*3/uL (ref 0.1–0.9)
MONO%: 6.3 % (ref 0.0–14.0)
NEUT%: 74.7 % (ref 38.4–76.8)
NEUTROS ABS: 7.6 10*3/uL — AB (ref 1.5–6.5)
PLATELETS: 186 10*3/uL (ref 145–400)
RBC: 4.67 10*6/uL (ref 3.70–5.45)
RDW: 13.5 % (ref 11.2–14.5)
WBC: 10.1 10*3/uL (ref 3.9–10.3)

## 2017-01-22 MED ORDER — PROCHLORPERAZINE MALEATE 10 MG PO TABS: 10.0000 mg | ORAL_TABLET | Freq: Four times a day (QID) | ORAL | 2 refills | Status: DC | PRN

## 2017-01-23 LAB — CANCER ANTIGEN 19-9: CAN 19-9: 44 U/mL — AB (ref 0–35)

## 2017-01-24 ENCOUNTER — Encounter: Payer: Self-pay | Admitting: Hematology

## 2017-01-26 ENCOUNTER — Telehealth: Payer: Self-pay | Admitting: *Deleted

## 2017-01-26 NOTE — Telephone Encounter (Signed)
Received call from pt who is upset that she hasn't been referred to surgeon stating that she called as instructed & was told they hadn't received referral.  Referral seen in EPIC but determined that [facility] can not see EPIC referral & call has to be made.  Talked with Butch Penny @ [facility] & faxed referral/demographics, & Dr Ernestina Penna OV note & she will try to expedite appt.  Pt infomed.

## 2017-01-27 ENCOUNTER — Ambulatory Visit (HOSPITAL_COMMUNITY)
Admission: RE | Admit: 2017-01-27 | Discharge: 2017-01-27 | Disposition: A | Discharge status: Home / Self Care | Payer: BLUE CROSS/BLUE SHIELD | Source: Ambulatory Visit | Attending: Hematology | Admitting: Hematology

## 2017-01-27 DIAGNOSIS — I700 Atherosclerosis of aorta: Secondary | ICD-10-CM | POA: Diagnosis not present

## 2017-01-27 DIAGNOSIS — C787 Secondary malignant neoplasm of liver and intrahepatic bile duct: Secondary | ICD-10-CM | POA: Insufficient documentation

## 2017-01-27 DIAGNOSIS — C221 Intrahepatic bile duct carcinoma: Secondary | ICD-10-CM

## 2017-01-27 DIAGNOSIS — R161 Splenomegaly, not elsewhere classified: Secondary | ICD-10-CM | POA: Diagnosis not present

## 2017-01-27 LAB — GLUCOSE, CAPILLARY: Glucose-Capillary: 93 mg/dL (ref 65–99)

## 2017-01-27 MED ORDER — FLUDEOXYGLUCOSE F - 18 (FDG) INJECTION
6.6600 | Freq: Once | INTRAVENOUS | Status: AC
  Administered 2017-01-27: 6.66 via INTRAVENOUS

## 2017-01-28 ENCOUNTER — Other Ambulatory Visit: Payer: Self-pay | Admitting: Hematology

## 2017-01-28 DIAGNOSIS — C787 Secondary malignant neoplasm of liver and intrahepatic bile duct: Principal | ICD-10-CM

## 2017-01-28 DIAGNOSIS — C221 Intrahepatic bile duct carcinoma: Secondary | ICD-10-CM

## 2017-01-28 NOTE — Progress Notes (Unsigned)
Ambulatory refer 

## 2017-02-02 ENCOUNTER — Other Ambulatory Visit: Payer: Self-pay | Admitting: Hematology

## 2017-02-02 DIAGNOSIS — C787 Secondary malignant neoplasm of liver and intrahepatic bile duct: Principal | ICD-10-CM

## 2017-02-02 DIAGNOSIS — C221 Intrahepatic bile duct carcinoma: Secondary | ICD-10-CM

## 2017-02-02 MED ORDER — PROCHLORPERAZINE MALEATE 10 MG PO TABS: 10.0000 mg | ORAL_TABLET | Freq: Four times a day (QID) | ORAL | 1 refills | Status: DC | PRN

## 2017-02-02 MED ORDER — ONDANSETRON HCL 8 MG PO TABS: 8.0000 mg | ORAL_TABLET | Freq: Two times a day (BID) | ORAL | 1 refills | Status: DC | PRN

## 2017-02-02 NOTE — Progress Notes (Signed)
START OFF PATHWAY REGIMEN - [Other Dx]   [id number]:Gemcitabine + Cisplatin every 21 days:   A cycle is every 21 days:     Gemcitabine      Cisplatin   **Always confirm dose/schedule in your pharmacy ordering system**  Patient Characteristics: Intent of Therapy: Non-Curative / Palliative Intent, Discussed with Patient 

## 2017-02-03 ENCOUNTER — Telehealth: Payer: Self-pay | Admitting: Hematology

## 2017-02-03 ENCOUNTER — Encounter: Payer: Self-pay | Admitting: *Deleted

## 2017-02-03 NOTE — Progress Notes (Signed)
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 Widespread hepatic metastasis, without abdominal primary identified. 2. Right twelfth rib metastasis. 3. Soft tissue fullness within the cecum, not entirely imaged. This could represent prominent stool or less likely a colon primary. Consider dedicated chest and pelvic CT or PET to evaluate for primary. 4. Suspicious right cardiophrenic angle adenopathy. Indeterminate/equivocal portal caval node. 5. Left adrenal adenoma. These results will be called to the ordering clinician or representative by the Radiologist Assistant, and communication documented in the PACS or zVision Dashboard.      12/24/2016 Procedure    Upper Endoscopy Nodular distal gastritis, biospied.      12/24/2016 Procedure    Colonoscopy  - The entire examined colon is normal on direct and retroflexion views. - No polyps or cancers.      12/24/2016 Pathology Results     Diagnosis Stomach, biopsy, distal - REACTIVE GASTROPATHY - NO MALIGNANCY IDENTIFIED      12/29/2016 Imaging    CT CAP w contrast  IMPRESSION: 1. No definite CT findings to suggest primary source of the apparent hepatic metastases. Gallbladder is  contracted and there is some wall irregularity in the body of the gallbladder but no gross gallbladder mass lesion is evident. No biliary dilatation. Previous MRI raise the question of the cecal lesion, but there is no evidence for gross cecal mass by CT today. There is some minimal fullness in the distal stomach, but this is a fairly typical appearance on CT and given the smooth appearance of the distal gastric wall and symmetric tissue fullness, neoplasm is felt to be not likely. 2. Hepatic metastases as seen on recent abdominal MRI. 3. Borderline to mild gastrohepatic, hepatoduodenal ligament, and retroperitoneal lymphadenopathy.  4. PET-CT may prove helpful to further evaluate.      01/01/2017 Imaging    US Biopsy  IMPRESSION: Status post ultrasound-guided biopsy of right liver tumor. Tissue specimen sent to pathology for complete histopathologic analysis.      01/01/2017 Procedure    Ultrasound-guided liver biopsy      01/01/2017 Pathology Results    Liver biopsy showed adenocarcinoma, consistent with cholangiocarcinoma.       01/01/2017 Initial Diagnosis    Cholangiocarcinoma metastatic to liver ([place])      01/27/2017 Imaging    NM PET scan IMPRESSION: Dominant geographic mass in the right hepatic lobe is heterogeneous but hypermetabolic with maximum SUV of 7.5. Likely metastatic hypermetabolic foci are present primarily in the right hepatic lobe but also involving the medial and lateral segments of the left hepatic lobe. Small lesion medially in the right twelfth rib subcostal space with very low metabolic activity with maximum SUV of 1.9, previously seen on the prior MRI of 12/17/2016. I am skeptical that this is a metastatic lesion.  This could well be a schwannoma or small vascular malformation. This does appear increased in size compared to 6/20 12/2012, and merits surveillance. Mild splenomegaly but without focal splenic hypermetabolic activity. 2.0 by 1.6 cm  hypodense right thyroid mass is mildly hypermetabolic. A significant minority of such lesions can turn out to represent thyroid cancer. Thyroid sonography is recommended for further characterization. Other imaging findings of potential clinical significance: Suspected cholelithiasis. Aortic Atherosclerosis (ICD10-I70.0).       HISTORY OF PRESENTING ILLNESS (01/12/17):  Cheryl [name] 61 y.o. female is here because of newly diagnosed Cholangiocarcinoma. She was referred by her gastroenterologist Dr. Ardis Hughs. She presents to my clinic with her husband today.  She has had vague, intermittent abdominal pain, and a total of 10 pound weight loss since December. She was originally everted by her primary care physician, and lab test showed elevated liver function tests. She underwent abdominal MRI with and without contrast on 12/17/2016, which showed wide spread hepatic metastasis, without abdominal primary identified. Soft tissue fullness within the cecum, indeterminate. She was referred to Dr. Ardis Hughs, and had negative colonoscopy and endoscopy on 12/24/2016. She underwent ultrasound guided liver biopsy on 01/01/2017, pathology showed adenocarcinoma, consistent with glandular carcinoma.   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   She reports some pain that started worsened days after her biopsy. She has nausea commonly. She is eating well. She reports fatigue and constipation and she takes [place] to help. Her bowel movements are about every 3 days. She denies chest pain, cough or SOB. She does mammograms normally with her most recent one being in June 2017.   She denies any history of cancer.   CURRENT THERAPY: pending first line chemo cisplatin and gemcitabine.chemotherapy q week x 2 weeks with 1 week off.  INTERVAL HISTORY:  Cheryl [name] returns to the clinic today for follow up. Pt notes that she has an appointment with Dr. Barry Dienes Monday, 02/08/17. Pt states that she has a  chemotherapy class that she is attending on 02/12/17. She notes that she has had fatigue and right flank pain. She reports that she still has Rx tramadol that she takes for her right flank pain. She states that she still has anti-emetic (zofran and compazine) medications at home and doesn't need a refill of any medications. Pt reports that she takes daily coumadin due to DVT approximately 6-7 years ago that she is evaluated by her PCP with her next appoitnement being 02/09/17. She notes that her husband is retired due to [employer] dx at this time and he didn't come to her viist today. Pt reports that she had to quit working and that she used to work for [employer].   On review of systems, pt denies pain. Pt reports fatigue. Denies fever, chills, weight loss, decreased appetite. Pt denies joint pain. Pt has right flank pain.    MEDICAL HISTORY:  Past Medical History:  Diagnosis Date  . History of blood clots   . Liver mass   . PONV (postoperative nausea and vomiting)    nausea    SURGICAL HISTORY: Past Surgical History:  Procedure Laterality Date  . ABDOMINAL HYSTERECTOMY     complete  . CESAREAN SECTION     x 1  . COLONOSCOPY WITH PROPOFOL N/A 12/24/2016   Procedure: COLONOSCOPY WITH PROPOFOL;  Surgeon: Milus Banister, MD;  Location: [facility];  Service: Endoscopy;  Laterality: N/A;  . ESOPHAGOGASTRODUODENOSCOPY (EGD) WITH PROPOFOL N/A 12/24/2016   Procedure:  ESOPHAGOGASTRODUODENOSCOPY (EGD) WITH PROPOFOL;  Surgeon: Milus Banister, MD;  Location: [facility];  Service: Endoscopy;  Laterality: N/A;    SOCIAL HISTORY: Social History   Social History  . Marital status: Married    Spouse name: N/A  . Number of children: 1  . Years of education: N/A   Occupational History  . Not on file.   Social History Main Topics  . Smoking status: Former Smoker    Packs/day: 1.00    Years: 15.00    Types: Cigarettes  . Smokeless tobacco: Never Used     Comment: quit 2010  .  Alcohol use No  . Drug use: No  . Sexual activity: Not on file   Other Topics Concern  . Not on file   Social History Narrative  . No narrative on file    FAMILY HISTORY: Family History  Problem Relation Age of Onset  . Stomach cancer Mother   . Emphysema Father     ALLERGIES:  is allergic to dilaudid [hydromorphone hcl]; sulfa antibiotics; and tramadol.  MEDICATIONS:  Current Outpatient Prescriptions  Medication Sig Dispense Refill  . acetaminophen (TYLENOL) 500 MG tablet Take 1,000 mg by mouth every 6 (six) hours as needed (for pain.).    Marland Kitchen traMADol (ULTRAM) 50 MG tablet Take 1 tablet (50 mg total) by mouth every 6 (six) hours as needed. 30 tablet 1  . warfarin (COUMADIN) 4 MG tablet Take 4 mg by mouth daily at 6 PM.     . ondansetron (ZOFRAN) 8 MG tablet Take 1 tablet (8 mg total) by mouth 2 (two) times daily as needed. Start on the third day after chemotherapy. (Patient not taking: Reported on 02/04/2017) 30 tablet 1  . prochlorperazine (COMPAZINE) 10 MG tablet Take 1 tablet (10 mg total) by mouth every 6 (six) hours as needed (Nausea or vomiting). (Patient not taking: Reported on 02/04/2017) 30 tablet 1   No current facility-administered medications for this visit.     REVIEW OF SYSTEMS:  Constitutional: Denies fevers, chills or abnormal night sweats (+) fatigue Eyes: Denies blurriness of vision, double vision or watery eyes Ears, nose, mouth, throat, and face: Denies mucositis or sore throat Respiratory: Denies cough, dyspnea or wheezes Cardiovascular: Denies palpitation, chest discomfort or lower extremity swelling Gastrointestinal:  Denies heartburn or change in bowel habits, (+) nausea Skin: Denies abnormal skin rashes Lymphatics: Denies new lymphadenopathy or easy bruising Neurological:Denies numbness, tingling or new weaknesses Behavioral/Psych: Mood is stable, no new changes  MSK: (+) right flank pain All other systems were reviewed with the patient and are  negative.  PHYSICAL EXAMINATION: ECOG PERFORMANCE STATUS: 1 - Symptomatic but completely ambulatory  Vitals:   02/04/17 0925  BP: 134/88  Pulse: 84  Resp: 17  Temp: 98.2 F (36.8 C)   Filed Weights   02/04/17 0925  Weight: 134 lb 9.6 oz (61.1 kg)    GENERAL:alert, no distress and comfortable SKIN: skin color, texture, turgor are normal, no rashes or significant lesions EYES: normal, conjunctiva are pink and non-injected, sclera clear OROPHARYNX:no exudate, no erythema and lips, buccal mucosa, and tongue normal  NECK: supple, thyroid normal size, non-tender, without nodularity LYMPH:  no palpable lymphadenopathy in the cervical, axillary or inguinal LUNGS: clear to auscultation and percussion with normal breathing effort HEART: regular rate & rhythm and no murmurs and no lower extremity edema ABDOMEN:abdomen soft, non-tender and normal bowel sounds. Enlarged liver to 4 cm and tender below the ribcage. Musculoskeletal:no cyanosis of digits and no clubbing  PSYCH: alert & oriented x 3 with fluent speech NEURO: no focal motor/sensory deficits   LABORATORY DATA:  I have reviewed the data as listed CBC Latest Ref Rng & Units 01/22/2017 01/05/2017 01/01/2017  WBC 3.9 - 10.3 10e3/uL 10.1 10.8(H) 9.5  Hemoglobin 11.6 - 15.9 g/dL 13.6 14.0 13.7  Hematocrit 34.8 - 46.6 % 41.3 41.8 40.6  Platelets 145 - 400 10e3/uL 186 286.0 186   CA 19.9:  01/22/17:  44.  PATHOLOGY: Diagnosis 12/24/2016 Stomach, biopsy, distal - REACTIVE GASTROPATHY - NO MALIGNANCY IDENTIFIED  Diagnosis 01/01/2017 Liver, needle/core biopsy ADENOCARCINOMA, CONSISTENT WITH CHOLANGIOCARCINOMA.  RADIOGRAPHIC STUDIES: I have personally reviewed the radiological images as listed and agreed with the findings in the report. Nm Pet Image Initial (pi) Skull Base To Thigh  Result Date: 01/27/2017 CLINICAL DATA:  Initial treatment strategy for cholangio carcinoma, metastatic within the liver. EXAM: NUCLEAR MEDICINE PET  SKULL BASE TO THIGH TECHNIQUE: 6.7 mCi F-18 FDG was injected intravenously. Full-ring PET imaging was performed from the skull base to thigh after the radiotracer. CT data was obtained and used for attenuation correction and anatomic localization. FASTING BLOOD GLUCOSE:  Value: 93 mg/dl COMPARISON:  Multiple exams, including CT abdomen from 12/29/2016 FINDINGS: NECK Lateral oropharyngeal and tonsillar activity is relatively symmetric and very likely physiologic. A 2.0 by 1.6 cm hypodense right thyroid mass has mildly accentuated metabolic activity with a maximum SUV of 4.7. CHEST No hypermetabolic mediastinal or hilar nodes. No suspicious pulmonary nodules on the CT scan. A benign-appearing calcified granuloma is present in the apical segment right upper lobe, image 9/7. ABDOMEN/PELVIS The dominant geographic mass primarily in the right hepatic lobe has heterogeneous abnormal accentuated activity with maximum SUV of approximately 7.5. There are also scattered abnormal foci of tumor separate from this main geographic lesion in the right hepatic lobe, with several small foci of tumor also in the left hepatic lobe, particularly the medial segment. Near the dome of the liver there is probably some involvement of segment 2 as well. The spleen measures 11.9 by 5.9 by 13.0 cm (volume = 480 cm^3), but without abnormal focal splenic activity. No abnormal hypermetabolic activity is identified within the bowel. Small depending gallstones in the gallbladder. Small upper abdominal retroperitoneal lymph nodes are not appreciably hypermetabolic and the portacaval node seen on the prior exam is likewise not significantly above the baseline surrounding activity. Aortoiliac atherosclerotic vascular disease. SKELETON No focal hypermetabolic activity to suggest skeletal metastasis. On the prior MRI, there was concern for right twelfth rib metastatic lesion. The enhancement in this vicinity appears to correspond to a 12 subcostal space  lesion measuring about 1.4 by 0.9 cm which has only very vague FDG activity with maximum SUV of 1.9. IMPRESSION: 1. Dominant geographic mass in the right hepatic lobe is heterogeneous but hypermetabolic with maximum SUV of 7.5. Likely metastatic hypermetabolic foci are present primarily in the right hepatic lobe but also involving the medial and lateral segments of the left hepatic lobe. 2. Small lesion medially in the right twelfth rib subcostal space with very low metabolic activity with maximum SUV of 1.9, previously seen on the prior MRI of 12/17/2016. I am skeptical that this is a metastatic lesion. This could well be a schwannoma or small vascular malformation. This does appear increased in size compared to 6/20 12/2012, and merits surveillance. 3. Mild splenomegaly but without focal splenic hypermetabolic activity. 4. 2.0 by 1.6 cm hypodense right thyroid mass is mildly hypermetabolic. A significant minority of such lesions can turn  out to represent thyroid cancer. Thyroid sonography is recommended for further characterization. 5. Other imaging findings of potential clinical significance: Suspected cholelithiasis. Aortic Atherosclerosis (ICD10-I70.0). Electronically Signed   By: Van Clines M.D.   On: 01/27/2017 15:24    ASSESSMENT & PLAN: Cheryl [name] is a  61 y.o. female, with past medical history of DVT on Coumadin, presented with intermittent abdominal pain, and 10 pound weight loss.  1. 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 -Her liver biopsy confirmed adenocarcinoma, consistent with cholangiocarcinoma. Her EGD and endoscopy was negative for primary tumor. CT chest, abdomen and pelvis was negative for other primary or metastasis. -CA19.9 was elevated at 44 -Her case was previously reviewed in our GI tumor Board, the T12 metastasis reported by  MRI was not highly suspicious for bone metastasis, local splenic findings on the CT scan, after we reviewed in the tumor board. -I reviewed her PET scan images with patient in person, unfortunately he has extensive liver metastasis, including metastatic lesion in the left lobe, no other distant metastasis. -I have discussed with Surgeon Dr. Barry Dienes who doesn't think that surgery resection is feasible due to left lobe liver metastasis.  -I recommend her to start his systemic chemotherapy to control her cancer. The goal of therapy is palliative, to prolong her life and reserve quality of life. -We discussed the options of systemic chemotherapy regiments, I recommend first-line therapy with cisplatin and gemcitabine, which is probably the most effective regimens for clinical carcinoma.  -Chemotherapy consent: Side effects including but does not not limited to, fatigue, nausea, vomiting, diarrhea, hair loss, neuropathy, fluid retention, renal and kidney dysfunction, neutropenic fever, needed for blood transfusion, bleeding, were discussed with patient in great detail.  - due to increased risk of blood clots, I strongly encouraged her to continue taking Coumadin. She should be careful about cutting herself or falling, due to risk of bleeding. -I'll reschedule the patient to start chemotherapy next Friday, I plan to see her back on week 2 chemotherapy.  2. Constipation - I encouraged her to drink more water or try prune juice, uses stool softener and laxative as needed.  3. Right flank pain - She has been trying Tylenol once daily - I will give her a prescription of Tramadol. She should try to eat before taking to avoid nausea.  4. Nausea - I will prescribe compazine for nausea - Patient still has a good appetite  5. Nutrition --will refer the pt to dietician for dietary aid.   PLAN -01/27/17 PET scan reviewed today.  -will consult with Dr. Barry Dienes to inquire if pt should cancel 02/08/17 appointment  and inform patient. -continue with chemo class today  -first cycle chemo cisplatin and gemcitabine on 8/10 -labs, chemo cisplatin and gemcitabine.on 8/31 and 9/7. Chemo would be q week x 2 weeks with 1 week off. -follow up in office on 02/19/17 prior to C1D8 chemotherapy treatment.   Orders Placed This Encounter  Procedures  . Consult to dietitian    All questions were answered. The patient knows to call the clinic with any problems, questions or concerns. I spent 20 minutes counseling the patient face to face. The total time spent in the appointment was 25 minutes and more than 50% was on counseling.  This document serves as a record of services personally performed by Truitt Merle, MD. It was created on her  behalf by Steva Colder, a trained medical scribe. The creation of this record is based on the scribe's personal observations and the provider's statements to them. This document has been checked and approved by the attending provider.    Truitt Merle, MD 02/04/2017

## 2017-02-03 NOTE — Telephone Encounter (Signed)
sw pt to confirm 8/3 chemo class and MD appt at 0900 per sch msg

## 2017-02-04 ENCOUNTER — Encounter: Payer: Self-pay | Admitting: Hematology

## 2017-02-04 ENCOUNTER — Encounter: Payer: Self-pay | Admitting: *Deleted

## 2017-02-04 ENCOUNTER — Other Ambulatory Visit: Payer: BLUE CROSS/BLUE SHIELD

## 2017-02-04 ENCOUNTER — Ambulatory Visit (HOSPITAL_BASED_OUTPATIENT_CLINIC_OR_DEPARTMENT_OTHER): Payer: BLUE CROSS/BLUE SHIELD | Admitting: Hematology

## 2017-02-04 ENCOUNTER — Telehealth: Payer: Self-pay | Admitting: Hematology

## 2017-02-04 VITALS — BP 134/88 | HR 84 | Temp 98.2°F | Resp 17 | Ht 68.5 in | Wt 134.6 lb

## 2017-02-04 DIAGNOSIS — R109 Unspecified abdominal pain: Secondary | ICD-10-CM

## 2017-02-04 DIAGNOSIS — C221 Intrahepatic bile duct carcinoma: Secondary | ICD-10-CM | POA: Diagnosis not present

## 2017-02-04 DIAGNOSIS — C787 Secondary malignant neoplasm of liver and intrahepatic bile duct: Secondary | ICD-10-CM

## 2017-02-04 DIAGNOSIS — R110 Nausea: Secondary | ICD-10-CM

## 2017-02-04 DIAGNOSIS — K5900 Constipation, unspecified: Secondary | ICD-10-CM | POA: Diagnosis not present

## 2017-02-04 NOTE — Telephone Encounter (Signed)
Scheduled appt per 8/2 los - Gave patient AVS and calender per los. 

## 2017-02-09 DIAGNOSIS — Z7901 Long term (current) use of anticoagulants: Secondary | ICD-10-CM | POA: Diagnosis not present

## 2017-02-09 DIAGNOSIS — Z86718 Personal history of other venous thrombosis and embolism: Secondary | ICD-10-CM | POA: Diagnosis not present

## 2017-02-12 ENCOUNTER — Other Ambulatory Visit: Payer: Self-pay | Admitting: *Deleted

## 2017-02-12 ENCOUNTER — Ambulatory Visit (HOSPITAL_BASED_OUTPATIENT_CLINIC_OR_DEPARTMENT_OTHER): Payer: BLUE CROSS/BLUE SHIELD

## 2017-02-12 ENCOUNTER — Encounter: Payer: Self-pay | Admitting: Hematology

## 2017-02-12 ENCOUNTER — Ambulatory Visit: Payer: BLUE CROSS/BLUE SHIELD | Admitting: Nutrition

## 2017-02-12 ENCOUNTER — Other Ambulatory Visit (HOSPITAL_BASED_OUTPATIENT_CLINIC_OR_DEPARTMENT_OTHER): Payer: BLUE CROSS/BLUE SHIELD

## 2017-02-12 VITALS — BP 139/75 | HR 86 | Temp 98.2°F | Resp 18

## 2017-02-12 DIAGNOSIS — C787 Secondary malignant neoplasm of liver and intrahepatic bile duct: Secondary | ICD-10-CM | POA: Diagnosis not present

## 2017-02-12 DIAGNOSIS — C221 Intrahepatic bile duct carcinoma: Secondary | ICD-10-CM

## 2017-02-12 DIAGNOSIS — Z5111 Encounter for antineoplastic chemotherapy: Secondary | ICD-10-CM

## 2017-02-12 LAB — COMPREHENSIVE METABOLIC PANEL
ALT: 46 U/L (ref 0–55)
ANION GAP: 10 meq/L (ref 3–11)
AST: 51 U/L — ABNORMAL HIGH (ref 5–34)
Albumin: 3.2 g/dL — ABNORMAL LOW (ref 3.5–5.0)
Alkaline Phosphatase: 307 U/L — ABNORMAL HIGH (ref 40–150)
BUN: 14.8 mg/dL (ref 7.0–26.0)
CHLORIDE: 103 meq/L (ref 98–109)
CO2: 26 meq/L (ref 22–29)
CREATININE: 0.8 mg/dL (ref 0.6–1.1)
Calcium: 9.9 mg/dL (ref 8.4–10.4)
EGFR: 84 mL/min/{1.73_m2} — ABNORMAL LOW (ref >90)
Glucose: 132 mg/dl (ref 70–140)
POTASSIUM: 3.8 meq/L (ref 3.5–5.1)
Sodium: 139 mEq/L (ref 136–145)
Total Bilirubin: 0.97 mg/dL (ref 0.20–1.20)
Total Protein: 7.6 g/dL (ref 6.4–8.3)

## 2017-02-12 LAB — CBC WITH DIFFERENTIAL/PLATELET
BASO%: 0.7 % (ref 0.0–2.0)
BASOS ABS: 0.1 10*3/uL (ref 0.0–0.1)
EOS%: 0.7 % (ref 0.0–7.0)
Eosinophils Absolute: 0.1 10*3/uL (ref 0.0–0.5)
HEMATOCRIT: 42.3 % (ref 34.8–46.6)
HGB: 14.2 g/dL (ref 11.6–15.9)
LYMPH#: 1.3 10*3/uL (ref 0.9–3.3)
LYMPH%: 13 % — AB (ref 14.0–49.7)
MCH: 28.9 pg (ref 25.1–34.0)
MCHC: 33.5 g/dL (ref 31.5–36.0)
MCV: 86.1 fL (ref 79.5–101.0)
MONO#: 0.5 10*3/uL (ref 0.1–0.9)
MONO%: 5.6 % (ref 0.0–14.0)
NEUT#: 7.7 10*3/uL — ABNORMAL HIGH (ref 1.5–6.5)
NEUT%: 80 % — AB (ref 38.4–76.8)
Platelets: 173 10*3/uL (ref 145–400)
RBC: 4.92 10*6/uL (ref 3.70–5.45)
RDW: 13.8 % (ref 11.2–14.5)
WBC: 9.7 10*3/uL (ref 3.9–10.3)

## 2017-02-12 MED ORDER — PALONOSETRON HCL INJECTION 0.25 MG/5ML
0.2500 mg | Freq: Once | INTRAVENOUS | Status: AC
  Administered 2017-02-12: 0.25 mg via INTRAVENOUS

## 2017-02-12 MED ORDER — POTASSIUM CHLORIDE 2 MEQ/ML IV SOLN
Freq: Once | INTRAVENOUS | Status: AC
  Administered 2017-02-12: 10:00:00 via INTRAVENOUS
  Filled 2017-02-12: qty 10

## 2017-02-12 MED ORDER — SODIUM CHLORIDE 0.9 % IV SOLN
Freq: Once | INTRAVENOUS | Status: AC
  Administered 2017-02-12: 10:00:00 via INTRAVENOUS

## 2017-02-12 MED ORDER — SODIUM CHLORIDE 0.9 % IV SOLN
25.0000 mg/m2 | Freq: Once | INTRAVENOUS | Status: AC
  Administered 2017-02-12: 43 mg via INTRAVENOUS
  Filled 2017-02-12: qty 43

## 2017-02-12 MED ORDER — PALONOSETRON HCL INJECTION 0.25 MG/5ML
INTRAVENOUS | Status: AC
  Filled 2017-02-12: qty 5

## 2017-02-12 MED ORDER — SODIUM CHLORIDE 0.9 % IV SOLN
1000.0000 mg/m2 | Freq: Once | INTRAVENOUS | Status: AC
  Administered 2017-02-12: 1710 mg via INTRAVENOUS
  Filled 2017-02-12: qty 44.97

## 2017-02-12 MED ORDER — SODIUM CHLORIDE 0.9 % IV SOLN
Freq: Once | INTRAVENOUS | Status: AC
  Administered 2017-02-12: 12:00:00 via INTRAVENOUS
  Filled 2017-02-12: qty 5

## 2017-02-12 NOTE — Progress Notes (Signed)
61 year old female diagnosed with cholangiocarcinoma with metastases to her liver in June 2018.  She is a patient of Dr. Burr Medico.  Past medical history includes postop nausea vomiting, and blood clots.  Medications include Zofran, Compazine, and Coumadin.  Labs include albumin 3.4.  Height: 68.5 inches. Weight: 134.6 pounds Usual body weight: 150 pounds in Feb 2017. BMI: 20.17.  Patient reports her appetite and oral intake are within normal limits. Current weight reflects 13 pound weight gain since June 2018. She does describe nausea, especially after chemotherapy. She has increased fatigue. She has constipation but this is relieved with Senokot every 3 days.  Nutrition diagnosis:  Unintended weight loss related to cholangiocarcinoma as evidenced by 10% weight loss from usual body weight.  Intervention: Educated patient on the importance of consuming high-calorie high-protein foods in small frequent meals and snacks. I reviewed high protein foods and provided a fact sheet. Educated patient on strategies for improving nausea and constipation.  Fact sheets were provided. Questions were answered.  Teach back method was used.  Contact information was given.  Monitoring, evaluation, goals: Patient will tolerate increased calories and protein to minimize further weight loss.  Next visit: Friday, August 31 during infusion.  **Disclaimer: This note was dictated with voice recognition software. Similar sounding words can inadvertently be transcribed and this note may contain transcription errors which may not have been corrected upon publication of note.**

## 2017-02-12 NOTE — Progress Notes (Signed)
Met w/ pt to introduce myself as her Arboriculturist and to discuss copay assistance w/ [employer].  I enrolled pt in the [place] First [employer] program, faxed signed form and activated card today.  I also informed her of the [place], went over what it covers and gave her an expense sheet.  Pt would like to apply, since she's not working she will provide her husband's proof of income on 02/19/17.  She has my card for any questions or concerns she may have in the future.

## 2017-02-12 NOTE — Patient Instructions (Signed)
[facility] Discharge Instructions for Patients Receiving Chemotherapy  Today you received the following chemotherapy agents: Gemzar and Cisplatin.  To help prevent nausea and vomiting after your treatment, we encourage you to take your nausea medication as prescribed.   If you develop nausea and vomiting that is not controlled by your nausea medication, call the clinic.   BELOW ARE SYMPTOMS THAT SHOULD BE REPORTED IMMEDIATELY:  *FEVER GREATER THAN 100.5 F  *CHILLS WITH OR WITHOUT FEVER  NAUSEA AND VOMITING THAT IS NOT CONTROLLED WITH YOUR NAUSEA MEDICATION  *UNUSUAL SHORTNESS OF BREATH  *UNUSUAL BRUISING OR BLEEDING  TENDERNESS IN MOUTH AND THROAT WITH OR WITHOUT PRESENCE OF ULCERS  *URINARY PROBLEMS  *BOWEL PROBLEMS  UNUSUAL RASH Items with * indicate a potential emergency and should be followed up as soon as possible.  Feel free to call the clinic you have any questions or concerns. The clinic phone number is [phone].  Please show the [facility] at check-in to the Emergency Department and triage nurse.  Gemcitabine injection What is this medicine? GEMCITABINE (jem SIT a been) is a chemotherapy drug. This medicine is used to treat many types of cancer like breast cancer, lung cancer, pancreatic cancer, and ovarian cancer. This medicine may be used for other purposes; ask your health care provider or pharmacist if you have questions. COMMON BRAND NAME(S): Gemzar What should I tell my health care provider before I take this medicine? They need to know if you have any of these conditions: -blood disorders -infection -kidney disease -liver disease -recent or ongoing radiation therapy -an unusual or allergic reaction to gemcitabine, other chemotherapy, other medicines, foods, dyes, or preservatives -pregnant or trying to get pregnant -breast-feeding How should I use this medicine? This drug is given as an infusion into a vein. It is  administered in a hospital or clinic by a specially trained health care professional. Talk to your pediatrician regarding the use of this medicine in children. Special care may be needed. Overdosage: If you think you have taken too much of this medicine contact a poison control center or emergency room at once. NOTE: This medicine is only for you. Do not share this medicine with others. What if I miss a dose? It is important not to miss your dose. Call your doctor or health care professional if you are unable to keep an appointment. What may interact with this medicine? -medicines to increase blood counts like filgrastim, pegfilgrastim, sargramostim -some other chemotherapy drugs like cisplatin -vaccines Talk to your doctor or health care professional before taking any of these medicines: -acetaminophen -aspirin -ibuprofen -ketoprofen -naproxen This list may not describe all possible interactions. Give your health care provider a list of all the medicines, herbs, non-prescription drugs, or dietary supplements you use. Also tell them if you smoke, drink alcohol, or use illegal drugs. Some items may interact with your medicine. What should I watch for while using this medicine? Visit your doctor for checks on your progress. This drug may make you feel generally unwell. This is not uncommon, as chemotherapy can affect healthy cells as well as cancer cells. Report any side effects. Continue your course of treatment even though you feel ill unless your doctor tells you to stop. In some cases, you may be given additional medicines to help with side effects. Follow all directions for their use. Call your doctor or health care professional for advice if you get a fever, chills or sore throat, or other symptoms of a cold  or flu. Do not treat yourself. This drug decreases your body's ability to fight infections. Try to avoid being around people who are sick. This medicine may increase your risk to bruise  or bleed. Call your doctor or health care professional if you notice any unusual bleeding. Be careful brushing and flossing your teeth or using a toothpick because you may get an infection or bleed more easily. If you have any dental work done, tell your dentist you are receiving this medicine. Avoid taking products that contain aspirin, acetaminophen, ibuprofen, naproxen, or ketoprofen unless instructed by your doctor. These medicines may hide a fever. Women should inform their doctor if they wish to become pregnant or think they might be pregnant. There is a potential for serious side effects to an unborn child. Talk to your health care professional or pharmacist for more information. Do not breast-feed an infant while taking this medicine. What side effects may I notice from receiving this medicine? Side effects that you should report to your doctor or health care professional as soon as possible: -allergic reactions like skin rash, itching or hives, swelling of the face, lips, or tongue -low blood counts - this medicine may decrease the number of white blood cells, red blood cells and platelets. You may be at increased risk for infections and bleeding. -signs of infection - fever or chills, cough, sore throat, pain or difficulty passing urine -signs of decreased platelets or bleeding - bruising, pinpoint red spots on the skin, black, tarry stools, blood in the urine -signs of decreased red blood cells - unusually weak or tired, fainting spells, lightheadedness -breathing problems -chest pain -mouth sores -nausea and vomiting -pain, swelling, redness at site where injected -pain, tingling, numbness in the hands or feet -stomach pain -swelling of ankles, feet, hands -unusual bleeding Side effects that usually do not require medical attention (report to your doctor or health care professional if they continue or are bothersome): -constipation -diarrhea -hair loss -loss of appetite -stomach  upset This list may not describe all possible side effects. Call your doctor for medical advice about side effects. You may report side effects to FDA at 1-800-FDA-1088. Where should I keep my medicine? This drug is given in a hospital or clinic and will not be stored at home. NOTE: This sheet is a summary. It may not cover all possible information. If you have questions about this medicine, talk to your doctor, pharmacist, or health care provider.  2018 [employer] Standard (2007-11-01 18:45:54)  Cisplatin injection What is this medicine? CISPLATIN (SIS pla tin) is a chemotherapy drug. It targets fast dividing cells, like cancer cells, and causes these cells to die. This medicine is used to treat many types of cancer like bladder, ovarian, and testicular cancers. This medicine may be used for other purposes; ask your health care provider or pharmacist if you have questions. COMMON BRAND NAME(S): Platinol, Platinol -AQ What should I tell my health care provider before I take this medicine? They need to know if you have any of these conditions: -blood disorders -hearing problems -kidney disease -recent or ongoing radiation therapy -an unusual or allergic reaction to cisplatin, carboplatin, other chemotherapy, other medicines, foods, dyes, or preservatives -pregnant or trying to get pregnant -breast-feeding How should I use this medicine? This drug is given as an infusion into a vein. It is administered in a hospital or clinic by a specially trained health care professional. Talk to your pediatrician regarding the use of this medicine in children. Special care may  be needed. Overdosage: If you think you have taken too much of this medicine contact a poison control center or emergency room at once. NOTE: This medicine is only for you. Do not share this medicine with others. What if I miss a dose? It is important not to miss a dose. Call your doctor or health care professional if you are  unable to keep an appointment. What may interact with this medicine? -dofetilide -foscarnet -medicines for seizures -medicines to increase blood counts like filgrastim, pegfilgrastim, sargramostim -probenecid -pyridoxine used with altretamine -rituximab -some antibiotics like amikacin, gentamicin, neomycin, polymyxin B, streptomycin, tobramycin -sulfinpyrazone -vaccines -zalcitabine Talk to your doctor or health care professional before taking any of these medicines: -acetaminophen -aspirin -ibuprofen -ketoprofen -naproxen This list may not describe all possible interactions. Give your health care provider a list of all the medicines, herbs, non-prescription drugs, or dietary supplements you use. Also tell them if you smoke, drink alcohol, or use illegal drugs. Some items may interact with your medicine. What should I watch for while using this medicine? Your condition will be monitored carefully while you are receiving this medicine. You will need important blood work done while you are taking this medicine. This drug may make you feel generally unwell. This is not uncommon, as chemotherapy can affect healthy cells as well as cancer cells. Report any side effects. Continue your course of treatment even though you feel ill unless your doctor tells you to stop. In some cases, you may be given additional medicines to help with side effects. Follow all directions for their use. Call your doctor or health care professional for advice if you get a fever, chills or sore throat, or other symptoms of a cold or flu. Do not treat yourself. This drug decreases your body's ability to fight infections. Try to avoid being around people who are sick. This medicine may increase your risk to bruise or bleed. Call your doctor or health care professional if you notice any unusual bleeding. Be careful brushing and flossing your teeth or using a toothpick because you may get an infection or bleed more easily.  If you have any dental work done, tell your dentist you are receiving this medicine. Avoid taking products that contain aspirin, acetaminophen, ibuprofen, naproxen, or ketoprofen unless instructed by your doctor. These medicines may hide a fever. Do not become pregnant while taking this medicine. Women should inform their doctor if they wish to become pregnant or think they might be pregnant. There is a potential for serious side effects to an unborn child. Talk to your health care professional or pharmacist for more information. Do not breast-feed an infant while taking this medicine. Drink fluids as directed while you are taking this medicine. This will help protect your kidneys. Call your doctor or health care professional if you get diarrhea. Do not treat yourself. What side effects may I notice from receiving this medicine? Side effects that you should report to your doctor or health care professional as soon as possible: -allergic reactions like skin rash, itching or hives, swelling of the face, lips, or tongue -signs of infection - fever or chills, cough, sore throat, pain or difficulty passing urine -signs of decreased platelets or bleeding - bruising, pinpoint red spots on the skin, black, tarry stools, nosebleeds -signs of decreased red blood cells - unusually weak or tired, fainting spells, lightheadedness -breathing problems -changes in hearing -gout pain -low blood counts - This drug may decrease the number of white blood cells, red blood  cells and platelets. You may be at increased risk for infections and bleeding. -nausea and vomiting -pain, swelling, redness or irritation at the injection site -pain, tingling, numbness in the hands or feet -problems with balance, movement -trouble passing urine or change in the amount of urine Side effects that usually do not require medical attention (report to your doctor or health care professional if they continue or are bothersome): -changes  in vision -loss of appetite -metallic taste in the mouth or changes in taste This list may not describe all possible side effects. Call your doctor for medical advice about side effects. You may report side effects to FDA at 1-800-FDA-1088. Where should I keep my medicine? This drug is given in a hospital or clinic and will not be stored at home. NOTE: This sheet is a summary. It may not cover all possible information. If you have questions about this medicine, talk to your doctor, pharmacist, or health care provider.  2018 [employer]/Gold Standard (2007-09-27 14:40:54)

## 2017-02-13 LAB — CANCER ANTIGEN 19-9: CA 19-9: 46 U/mL — ABNORMAL HIGH (ref 0–35)

## 2017-02-16 ENCOUNTER — Other Ambulatory Visit: Payer: Self-pay | Admitting: Hematology

## 2017-02-16 ENCOUNTER — Telehealth: Payer: Self-pay | Admitting: *Deleted

## 2017-02-16 DIAGNOSIS — C787 Secondary malignant neoplasm of liver and intrahepatic bile duct: Principal | ICD-10-CM

## 2017-02-16 DIAGNOSIS — C221 Intrahepatic bile duct carcinoma: Secondary | ICD-10-CM

## 2017-02-16 NOTE — Telephone Encounter (Signed)
I have placed an order for port placement by IR and sent a scheduling message. Thanks.   Truitt Merle MD

## 2017-02-16 NOTE — Progress Notes (Signed)
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 Widespread hepatic metastasis, without abdominal primary identified. 2. Right twelfth rib metastasis. 3. Soft tissue fullness within the cecum, not entirely imaged. This could represent prominent stool or less likely a colon primary. Consider dedicated chest and pelvic CT or PET to evaluate for primary. 4. Suspicious right cardiophrenic angle adenopathy. Indeterminate/equivocal portal caval node. 5. Left adrenal adenoma. These results will be called to the ordering clinician or representative by the Radiologist Assistant, and communication documented in the PACS or zVision Dashboard.      12/24/2016 Procedure    Upper Endoscopy Nodular distal gastritis, biospied.      12/24/2016 Procedure    Colonoscopy  - The entire examined colon is normal on direct and retroflexion views. - No polyps or cancers.      12/24/2016 Pathology Results     Diagnosis Stomach, biopsy, distal - REACTIVE GASTROPATHY - NO MALIGNANCY IDENTIFIED      12/29/2016 Imaging    CT CAP w contrast  IMPRESSION: 1. No definite CT findings to suggest primary source of the apparent hepatic metastases. Gallbladder is  contracted and there is some wall irregularity in the body of the gallbladder but no gross gallbladder mass lesion is evident. No biliary dilatation. Previous MRI raise the question of the cecal lesion, but there is no evidence for gross cecal mass by CT today. There is some minimal fullness in the distal stomach, but this is a fairly typical appearance on CT and given the smooth appearance of the distal gastric wall and symmetric tissue fullness, neoplasm is felt to be not likely. 2. Hepatic metastases as seen on recent abdominal MRI. 3. Borderline to mild gastrohepatic, hepatoduodenal ligament, and retroperitoneal lymphadenopathy.  4. PET-CT may prove helpful to further evaluate.      01/01/2017 Imaging    US Biopsy  IMPRESSION: Status post ultrasound-guided biopsy of right liver tumor. Tissue specimen sent to pathology for complete histopathologic analysis.      01/01/2017 Procedure    Ultrasound-guided liver biopsy      01/01/2017 Pathology Results    Liver biopsy showed adenocarcinoma, consistent with cholangiocarcinoma.       01/01/2017 Initial Diagnosis    Cholangiocarcinoma metastatic to liver ([place])      01/27/2017 Imaging    NM PET scan IMPRESSION: Dominant geographic mass in the right hepatic lobe is heterogeneous but hypermetabolic with maximum SUV of 7.5. Likely metastatic hypermetabolic foci are present primarily in the right hepatic lobe but also involving the medial and lateral segments of the left hepatic lobe. Small lesion medially in the right twelfth rib subcostal space with very low metabolic activity with maximum SUV of 1.9, previously seen on the prior MRI of 12/17/2016. I am skeptical that this is a metastatic lesion. This  could well be a schwannoma or small vascular malformation. This does appear increased in size compared to 6/20 12/2012, and merits surveillance. Mild splenomegaly but without focal splenic hypermetabolic activity. 2.0 by 1.6 cm  hypodense right thyroid mass is mildly hypermetabolic. A significant minority of such lesions can turn out to represent thyroid cancer. Thyroid sonography is recommended for further characterization. Other imaging findings of potential clinical significance: Suspected cholelithiasis. Aortic Atherosclerosis (ICD10-I70.0).      HISTORY OF PRESENTING ILLNESS (01/12/17):  Cheryl [name] 61 y.o. female is here because of newly diagnosed Cholangiocarcinoma. She was referred by her gastroenterologist Dr. Ardis Hughs. She presents to my clinic with her husband today.  She has had vague, intermittent abdominal pain, and a total of 10 pound weight loss since December. She was originally everted by her primary care physician, and lab test showed elevated liver function tests. She underwent abdominal MRI with and without contrast on 12/17/2016, which showed wide spread hepatic metastasis, without abdominal primary identified. Soft tissue fullness within the cecum, indeterminate. She was referred to Dr. Ardis Hughs, and had negative colonoscopy and endoscopy on 12/24/2016. She underwent ultrasound guided liver biopsy on 01/01/2017, pathology showed adenocarcinoma, consistent with glandular carcinoma.   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   She reports some pain that started worsened days after her biopsy. She has nausea commonly. She is eating well. She reports fatigue and constipation and she takes [place] to help. Her bowel movements are about every 3 days. She denies chest pain, cough or SOB. She does mammograms normally with her most recent one being in June 2017.  She denies any history of cancer.   CURRENT THERAPY: pending first line chemo cisplatin and gemcitabine. chemotherapy q week x 2 weeks with 1 week off.  INTERVAL HISTORY:  Cheryl [name] returns to the clinic today for follow up and following cycle 1 of Platinol and Gemzar.  She is with her daughter, [name]. Her first cycle went  well, she tolerated this overall well but was persistently fatigued. Her nausea has been well controlled at home with antiemetics.   Her daughter notes that prior to her treatment she was on Zoloft. She only took this for one month; she stopped taking this as it was not helping her. She notes that the pt has been having difficulty getting out of bed in the mornings. Pt is not currently followed by a therapist.   [place] of [place], this is approximately 5hrs by car ride.   On ROS, she denies cough, fever. She reports fatigue and depression.    MEDICAL HISTORY:  Past Medical History:  Diagnosis Date  . History of blood clots   . Liver mass   . PONV (postoperative nausea and vomiting)    nausea   SURGICAL HISTORY: Past Surgical History:  Procedure Laterality Date  . ABDOMINAL HYSTERECTOMY     complete  . CESAREAN SECTION     x 1  . COLONOSCOPY WITH PROPOFOL N/A 12/24/2016   Procedure: COLONOSCOPY WITH PROPOFOL;  Surgeon: Milus Banister, MD;  Location: [facility];  Service: Endoscopy;  Laterality: N/A;  . ESOPHAGOGASTRODUODENOSCOPY (EGD) WITH PROPOFOL N/A 12/24/2016   Procedure: ESOPHAGOGASTRODUODENOSCOPY (EGD) WITH PROPOFOL;  Surgeon: Milus Banister, MD;  Location: [facility];  Service: Endoscopy;  Laterality: N/A;   SOCIAL HISTORY: Social History   Social History  . Marital status: Married    Spouse name: N/A  .  Number of children: 1  . Years of education: N/A   Occupational History  . Not on file.   Social History Main Topics  . Smoking status: Former Smoker    Packs/day: 1.00    Years: 15.00    Types: Cigarettes  . Smokeless tobacco: Never Used     Comment: quit 2010  . Alcohol use No  . Drug use: No  . Sexual activity: Not on file   Other Topics Concern  . Not on file   Social History Narrative  . No narrative on file   FAMILY HISTORY: Family History  Problem Relation Age of Onset  . Stomach cancer Mother    . Emphysema Father    ALLERGIES:  is allergic to dilaudid [hydromorphone hcl]; sulfa antibiotics; and tramadol.  MEDICATIONS:  Current Outpatient Prescriptions  Medication Sig Dispense Refill  . acetaminophen (TYLENOL) 500 MG tablet Take 1,000 mg by mouth every 6 (six) hours as needed (for pain.).    Marland Kitchen escitalopram (LEXAPRO) 10 MG tablet Take 1 tablet (10 mg total) by mouth daily. 30 tablet 2  . ondansetron (ZOFRAN) 8 MG tablet Take 1 tablet (8 mg total) by mouth 2 (two) times daily as needed. Start on the third day after chemotherapy. (Patient not taking: Reported on 02/04/2017) 30 tablet 1  . prochlorperazine (COMPAZINE) 10 MG tablet Take 1 tablet (10 mg total) by mouth every 6 (six) hours as needed (Nausea or vomiting). (Patient not taking: Reported on 02/04/2017) 30 tablet 1  . traMADol (ULTRAM) 50 MG tablet Take 1 tablet (50 mg total) by mouth every 6 (six) hours as needed. 30 tablet 1  . warfarin (COUMADIN) 4 MG tablet Take 4 mg by mouth daily at 6 PM.      No current facility-administered medications for this visit.     REVIEW OF SYSTEMS:  Constitutional: Denies fevers, chills or abnormal night sweats (+) fatigue Eyes: Denies blurriness of vision, double vision or watery eyes Ears, nose, mouth, throat, and face: Denies mucositis or sore throat Respiratory: Denies cough, dyspnea or wheezes Cardiovascular: Denies palpitation, chest discomfort or lower extremity swelling Gastrointestinal:  Denies heartburn or change in bowel habits, (+) nausea, controlled Skin: Denies abnormal skin rashes Lymphatics: Denies new lymphadenopathy or easy bruising Neurological:Denies numbness, tingling or new weaknesses Behavioral/Psych: (+) depression MSK: Denies arthralgias.  All other systems were reviewed with the patient and are negative.  PHYSICAL EXAMINATION:  ECOG PERFORMANCE STATUS: 1 - Symptomatic but completely ambulatory BP 122/76, HR 78, RR 18, T 36.7  GENERAL:alert, no distress and  comfortable SKIN: skin color, texture, turgor are normal, no rashes or significant lesions EYES: normal, conjunctiva are pink and non-injected, sclera clear OROPHARYNX:no exudate, no erythema and lips, buccal mucosa, and tongue normal  NECK: supple, thyroid normal size, non-tender, without nodularity LYMPH:  no palpable lymphadenopathy in the cervical, axillary or inguinal LUNGS: clear to auscultation and percussion with normal breathing effort HEART: regular rate & rhythm and no murmurs and no lower extremity edema ABDOMEN:abdomen soft, non-tender and normal bowel sounds. Enlarged liver to 4 cm and tender below the ribcage. Musculoskeletal:no cyanosis of digits and no clubbing  PSYCH: alert & oriented x 3 with fluent speech NEURO: no focal motor/sensory deficits  LABORATORY DATA:  I have reviewed the data as listed CBC Latest Ref Rng & Units 02/19/2017 02/12/2017 01/22/2017  WBC 3.9 - 10.3 10e3/uL 6.7 9.7 10.1  Hemoglobin 11.6 - 15.9 g/dL 13.0 14.2 13.6  Hematocrit 34.8 - 46.6 % 39.2 42.3 41.3  Platelets 145 - 400 10e3/uL 136(L) 173 186   CA 19.9:  01/22/17:  44.  PATHOLOGY: Diagnosis 12/24/2016 Stomach, biopsy, distal - REACTIVE GASTROPATHY - NO MALIGNANCY IDENTIFIED  Diagnosis 01/01/2017 Liver, needle/core biopsy ADENOCARCINOMA, CONSISTENT WITH CHOLANGIOCARCINOMA.  RADIOGRAPHIC STUDIES: I have personally reviewed the radiological images as listed and agreed with the findings in the report. Nm Pet Image Initial (pi) Skull Base To Thigh  Result Date: 01/27/2017 CLINICAL DATA:  Initial treatment strategy for cholangio carcinoma, metastatic within the liver. EXAM: NUCLEAR MEDICINE PET SKULL BASE TO THIGH TECHNIQUE: 6.7 mCi F-18 FDG was injected intravenously. Full-ring PET imaging was performed from the skull base to thigh after the radiotracer. CT data was obtained and used for attenuation correction and anatomic localization. FASTING BLOOD GLUCOSE:  Value: 93 mg/dl COMPARISON:   Multiple exams, including CT abdomen from 12/29/2016 FINDINGS: NECK Lateral oropharyngeal and tonsillar activity is relatively symmetric and very likely physiologic. A 2.0 by 1.6 cm hypodense right thyroid mass has mildly accentuated metabolic activity with a maximum SUV of 4.7. CHEST No hypermetabolic mediastinal or hilar nodes. No suspicious pulmonary nodules on the CT scan. A benign-appearing calcified granuloma is present in the apical segment right upper lobe, image 9/7. ABDOMEN/PELVIS The dominant geographic mass primarily in the right hepatic lobe has heterogeneous abnormal accentuated activity with maximum SUV of approximately 7.5. There are also scattered abnormal foci of tumor separate from this main geographic lesion in the right hepatic lobe, with several small foci of tumor also in the left hepatic lobe, particularly the medial segment. Near the dome of the liver there is probably some involvement of segment 2 as well. The spleen measures 11.9 by 5.9 by 13.0 cm (volume = 480 cm^3), but without abnormal focal splenic activity. No abnormal hypermetabolic activity is identified within the bowel. Small depending gallstones in the gallbladder. Small upper abdominal retroperitoneal lymph nodes are not appreciably hypermetabolic and the portacaval node seen on the prior exam is likewise not significantly above the baseline surrounding activity. Aortoiliac atherosclerotic vascular disease. SKELETON No focal hypermetabolic activity to suggest skeletal metastasis. On the prior MRI, there was concern for right twelfth rib metastatic lesion. The enhancement in this vicinity appears to correspond to a 12 subcostal space lesion measuring about 1.4 by 0.9 cm which has only very vague FDG activity with maximum SUV of 1.9. IMPRESSION: 1. Dominant geographic mass in the right hepatic lobe is heterogeneous but hypermetabolic with maximum SUV of 7.5. Likely metastatic hypermetabolic foci are present primarily in the right  hepatic lobe but also involving the medial and lateral segments of the left hepatic lobe. 2. Small lesion medially in the right twelfth rib subcostal space with very low metabolic activity with maximum SUV of 1.9, previously seen on the prior MRI of 12/17/2016. I am skeptical that this is a metastatic lesion. This could well be a schwannoma or small vascular malformation. This does appear increased in size compared to 6/20 12/2012, and merits surveillance. 3. Mild splenomegaly but without focal splenic hypermetabolic activity. 4. 2.0 by 1.6 cm hypodense right thyroid mass is mildly hypermetabolic. A significant minority of such lesions can turn out to represent thyroid cancer. Thyroid sonography is recommended for further characterization. 5. Other imaging findings of potential clinical significance: Suspected cholelithiasis. Aortic Atherosclerosis (ICD10-I70.0). Electronically Signed   By: Van Clines M.D.   On: 01/27/2017 15:24   ASSESSMENT & PLAN: Cheryl [name] is a  61 y.o. female, with past medical history of DVT on Coumadin, presented with  intermittent abdominal pain, and 10 pound weight loss.  1. 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 -Her liver biopsy confirmed adenocarcinoma, consistent with cholangiocarcinoma. Her EGD and endoscopy was negative for primary tumor. CT chest, abdomen and pelvis was negative for other primary or metastasis. -CA19.9 was elevated at 44 -Her case was previously reviewed in our GI tumor Board, the T12 metastasis reported by MRI was not highly suspicious for bone metastasis, local splenic findings on the CT scan, after we reviewed in the tumor board. -I reviewed her PET scan images with patient in person, unfortunately he has extensive liver metastasis, including metastatic lesion in the left lobe, no other distant  metastasis. -I have discussed with Surgeon Dr. Barry Dienes who doesn't think that surgery resection is feasible due to left lobe liver metastasis.  -I recommend her to start his systemic chemotherapy to control her cancer. The goal of therapy is palliative, to prolong her life and reserve quality of life. -We discussed the options of systemic chemotherapy regiments, I recommend first-line therapy with cisplatin and gemcitabine, which is probably the most effective regimens for clinical carcinoma.  -she has started chemo, tolerated first dose well except moderate to severe fatigue, which partially could be related to her depression also  -Advised her that following her cycle 2 that she may feel more fatigued than what she has already experienced. I encouraged her to go on more walks and exercise more frequently to attempt at aiding with this.   2. Constipation - I encouraged her to drink more water or try prune juice, uses stool softener and laxative as needed.  3. Right flank pain - She has been trying Tylenol once daily - I will give her a prescription of Tramadol. She should try to eat before taking to avoid nausea.  4. Nausea - I will prescribe compazine for nausea.  - Patient still has a good appetite.  -Has been taking antiemetics as prescribed, this has been assisting with her nausea. Advised her to continue with this as needed.   5. Nutrition -f/u with dietician    6. Depression  -Was previously on Zoloft, stopped this after one month d/t it not helping her.  -Will start her on 10mg  Lexapro daily and encouraged her to take this for 4-6 weeks before expecting results.  -Will also consult with social work about establishing care with a therapist.    Hall reviewed; stable. Continue with C1D8 chemo today.  -Will prescribe 10mg  Lexapro daily to assist with her depression. I will also consult with social work to aid in establishing care with a therapist as well.  -Labs and f/u in  2wks before cycle 2   Orders Placed This Encounter  Procedures  . Ambulatory referral to Social Work    Referral Priority:   Routine    Referral Type:   Consultation    Referral Reason:   Specialty Services Required    Number of Visits Requested:   1   All questions were answered. The patient knows to call the clinic with any problems, questions or concerns. I spent 20 minutes counseling the patient face to face. The total time spent in the appointment was 25 minutes and more than 50% was on counseling.  This document serves as a record of services personally performed by Truitt Merle, MD. It was created on her behalf by Renae Gloss  Hiatt, a trained medical scribe. The creation of this record is based on the scribe's personal observations and the provider's statements to them. This document has been checked and approved by the attending provider.    Truitt Merle, MD 02/19/2017

## 2017-02-16 NOTE — Telephone Encounter (Signed)
"  Dr. Burr Medico talked with me last Friday with first treatment about radiologist inserting a port-a-cath.  I'm not to keen on it but if I need it   For my treatments I will.  Jan did well, could I request her?"    Routing call information to collaborative nurse and provider for review.  Further patient communication through collaborative nurse.   Instructions provided by this nurse include hydration day before and on treatment day, hand exercises, warm arms with jacket.  Nurse can use warm towels, blankets or laser device to find veins.  Jan RN will not be in office 02-19-2017, added request for Jan to current appointments.

## 2017-02-17 ENCOUNTER — Telehealth: Payer: Self-pay

## 2017-02-17 NOTE — Telephone Encounter (Signed)
IR order noted at [facility] per 8/14 inbasket message,IR will call the patient

## 2017-02-18 ENCOUNTER — Telehealth: Payer: Self-pay | Admitting: Hematology

## 2017-02-18 NOTE — Telephone Encounter (Signed)
sw pt to inform of appt time change on 8/17 per sch msg

## 2017-02-19 ENCOUNTER — Ambulatory Visit (HOSPITAL_BASED_OUTPATIENT_CLINIC_OR_DEPARTMENT_OTHER): Payer: BLUE CROSS/BLUE SHIELD | Admitting: Hematology

## 2017-02-19 ENCOUNTER — Ambulatory Visit: Payer: BLUE CROSS/BLUE SHIELD

## 2017-02-19 ENCOUNTER — Telehealth: Payer: Self-pay | Admitting: Hematology

## 2017-02-19 ENCOUNTER — Other Ambulatory Visit (HOSPITAL_BASED_OUTPATIENT_CLINIC_OR_DEPARTMENT_OTHER): Payer: BLUE CROSS/BLUE SHIELD

## 2017-02-19 ENCOUNTER — Ambulatory Visit (HOSPITAL_BASED_OUTPATIENT_CLINIC_OR_DEPARTMENT_OTHER): Payer: BLUE CROSS/BLUE SHIELD

## 2017-02-19 ENCOUNTER — Encounter: Payer: Self-pay | Admitting: Hematology

## 2017-02-19 VITALS — BP 122/76 | HR 78 | Temp 98.0°F | Resp 18

## 2017-02-19 DIAGNOSIS — R110 Nausea: Secondary | ICD-10-CM

## 2017-02-19 DIAGNOSIS — C787 Secondary malignant neoplasm of liver and intrahepatic bile duct: Secondary | ICD-10-CM

## 2017-02-19 DIAGNOSIS — Z5111 Encounter for antineoplastic chemotherapy: Secondary | ICD-10-CM | POA: Diagnosis not present

## 2017-02-19 DIAGNOSIS — R530 Neoplastic (malignant) related fatigue: Secondary | ICD-10-CM | POA: Diagnosis not present

## 2017-02-19 DIAGNOSIS — F329 Major depressive disorder, single episode, unspecified: Secondary | ICD-10-CM | POA: Diagnosis not present

## 2017-02-19 DIAGNOSIS — R109 Unspecified abdominal pain: Secondary | ICD-10-CM

## 2017-02-19 DIAGNOSIS — C221 Intrahepatic bile duct carcinoma: Secondary | ICD-10-CM

## 2017-02-19 DIAGNOSIS — K5900 Constipation, unspecified: Secondary | ICD-10-CM | POA: Diagnosis not present

## 2017-02-19 LAB — COMPREHENSIVE METABOLIC PANEL
ALBUMIN: 3.3 g/dL — AB (ref 3.5–5.0)
ALK PHOS: 260 U/L — AB (ref 40–150)
ALT: 64 U/L — AB (ref 0–55)
ANION GAP: 7 meq/L (ref 3–11)
AST: 42 U/L — ABNORMAL HIGH (ref 5–34)
BILIRUBIN TOTAL: 0.71 mg/dL (ref 0.20–1.20)
BUN: 14.9 mg/dL (ref 7.0–26.0)
CALCIUM: 9.8 mg/dL (ref 8.4–10.4)
CHLORIDE: 103 meq/L (ref 98–109)
CO2: 27 mEq/L (ref 22–29)
CREATININE: 0.7 mg/dL (ref 0.6–1.1)
EGFR: >90 mL/min/{1.73_m2} (ref >90)
Glucose: 82 mg/dl (ref 70–140)
Potassium: 4.3 mEq/L (ref 3.5–5.1)
Sodium: 137 mEq/L (ref 136–145)
TOTAL PROTEIN: 7.3 g/dL (ref 6.4–8.3)

## 2017-02-19 LAB — CBC WITH DIFFERENTIAL/PLATELET
BASO%: 0.5 % (ref 0.0–2.0)
BASOS ABS: 0 10*3/uL (ref 0.0–0.1)
EOS%: 0.4 % (ref 0.0–7.0)
Eosinophils Absolute: 0 10*3/uL (ref 0.0–0.5)
HEMATOCRIT: 39.2 % (ref 34.8–46.6)
HEMOGLOBIN: 13 g/dL (ref 11.6–15.9)
LYMPH#: 1.5 10*3/uL (ref 0.9–3.3)
LYMPH%: 21.7 % (ref 14.0–49.7)
MCH: 28.8 pg (ref 25.1–34.0)
MCHC: 33.2 g/dL (ref 31.5–36.0)
MCV: 86.7 fL (ref 79.5–101.0)
MONO#: 0.4 10*3/uL (ref 0.1–0.9)
MONO%: 6.3 % (ref 0.0–14.0)
NEUT#: 4.7 10*3/uL (ref 1.5–6.5)
NEUT%: 71.1 % (ref 38.4–76.8)
PLATELETS: 136 10*3/uL — AB (ref 145–400)
RBC: 4.52 10*6/uL (ref 3.70–5.45)
RDW: 13.8 % (ref 11.2–14.5)
WBC: 6.7 10*3/uL (ref 3.9–10.3)

## 2017-02-19 MED ORDER — SODIUM CHLORIDE 0.9 % IV SOLN
Freq: Once | INTRAVENOUS | Status: AC
  Administered 2017-02-19: 11:00:00 via INTRAVENOUS

## 2017-02-19 MED ORDER — SODIUM CHLORIDE 0.9 % IV SOLN
25.0000 mg/m2 | Freq: Once | INTRAVENOUS | Status: AC
  Administered 2017-02-19: 43 mg via INTRAVENOUS
  Filled 2017-02-19: qty 43

## 2017-02-19 MED ORDER — PALONOSETRON HCL INJECTION 0.25 MG/5ML
0.2500 mg | Freq: Once | INTRAVENOUS | Status: AC
  Administered 2017-02-19: 0.25 mg via INTRAVENOUS

## 2017-02-19 MED ORDER — ESCITALOPRAM OXALATE 10 MG PO TABS: 10.0000 mg | ORAL_TABLET | Freq: Every day | ORAL | 2 refills | Status: AC

## 2017-02-19 MED ORDER — PALONOSETRON HCL INJECTION 0.25 MG/5ML
INTRAVENOUS | Status: AC
  Filled 2017-02-19: qty 5

## 2017-02-19 MED ORDER — POTASSIUM CHLORIDE 2 MEQ/ML IV SOLN
Freq: Once | INTRAVENOUS | Status: AC
  Administered 2017-02-19: 12:00:00 via INTRAVENOUS
  Filled 2017-02-19: qty 10

## 2017-02-19 MED ORDER — SODIUM CHLORIDE 0.9 % IV SOLN
1000.0000 mg/m2 | Freq: Once | INTRAVENOUS | Status: AC
  Administered 2017-02-19: 1710 mg via INTRAVENOUS
  Filled 2017-02-19: qty 44.97

## 2017-02-19 MED ORDER — SODIUM CHLORIDE 0.9 % IV SOLN
Freq: Once | INTRAVENOUS | Status: AC
  Administered 2017-02-19: 13:00:00 via INTRAVENOUS
  Filled 2017-02-19: qty 5

## 2017-02-19 NOTE — Telephone Encounter (Signed)
Scheduled flush appts per 8/17 los - patient aware and will get a new updated schedule after treatment.

## 2017-02-19 NOTE — Progress Notes (Signed)
Pt changed her mind and would not like to apply for the [place].  She has my card in case she changes her mind in the future.

## 2017-02-19 NOTE — Patient Instructions (Addendum)
[facility] Discharge Instructions for Patients Receiving Chemotherapy  Today you received the following chemotherapy agents: Gemzar and Cisplatin.  To help prevent nausea and vomiting after your treatment, we encourage you to take your nausea medication as prescribed.   If you develop nausea and vomiting that is not controlled by your nausea medication, call the clinic.   BELOW ARE SYMPTOMS THAT SHOULD BE REPORTED IMMEDIATELY:  *FEVER GREATER THAN 100.5 F  *CHILLS WITH OR WITHOUT FEVER  NAUSEA AND VOMITING THAT IS NOT CONTROLLED WITH YOUR NAUSEA MEDICATION  *UNUSUAL SHORTNESS OF BREATH  *UNUSUAL BRUISING OR BLEEDING  TENDERNESS IN MOUTH AND THROAT WITH OR WITHOUT PRESENCE OF ULCERS  *URINARY PROBLEMS  *BOWEL PROBLEMS  UNUSUAL RASH Items with * indicate a potential emergency and should be followed up as soon as possible.  Feel free to call the clinic you have any questions or concerns. The clinic phone number is [phone].  Please show the [facility] at check-in to the Emergency Department and triage nurse.  Gemcitabine injection What is this medicine? GEMCITABINE (jem SIT a been) is a chemotherapy drug. This medicine is used to treat many types of cancer like breast cancer, lung cancer, pancreatic cancer, and ovarian cancer. This medicine may be used for other purposes; ask your health care provider or pharmacist if you have questions. COMMON BRAND NAME(S): Gemzar What should I tell my health care provider before I take this medicine? They need to know if you have any of these conditions: -blood disorders -infection -kidney disease -liver disease -recent or ongoing radiation therapy -an unusual or allergic reaction to gemcitabine, other chemotherapy, other medicines, foods, dyes, or preservatives -pregnant or trying to get pregnant -breast-feeding How should I use this medicine? This drug is given as an infusion into a vein. It is  administered in a hospital or clinic by a specially trained health care professional. Talk to your pediatrician regarding the use of this medicine in children. Special care may be needed. Overdosage: If you think you have taken too much of this medicine contact a poison control center or emergency room at once. NOTE: This medicine is only for you. Do not share this medicine with others. What if I miss a dose? It is important not to miss your dose. Call your doctor or health care professional if you are unable to keep an appointment. What may interact with this medicine? -medicines to increase blood counts like filgrastim, pegfilgrastim, sargramostim -some other chemotherapy drugs like cisplatin -vaccines Talk to your doctor or health care professional before taking any of these medicines: -acetaminophen -aspirin -ibuprofen -ketoprofen -naproxen This list may not describe all possible interactions. Give your health care provider a list of all the medicines, herbs, non-prescription drugs, or dietary supplements you use. Also tell them if you smoke, drink alcohol, or use illegal drugs. Some items may interact with your medicine. What should I watch for while using this medicine? Visit your doctor for checks on your progress. This drug may make you feel generally unwell. This is not uncommon, as chemotherapy can affect healthy cells as well as cancer cells. Report any side effects. Continue your course of treatment even though you feel ill unless your doctor tells you to stop. In some cases, you may be given additional medicines to help with side effects. Follow all directions for their use. Call your doctor or health care professional for advice if you get a fever, chills or sore throat, or other symptoms of a cold  or flu. Do not treat yourself. This drug decreases your body's ability to fight infections. Try to avoid being around people who are sick. This medicine may increase your risk to bruise  or bleed. Call your doctor or health care professional if you notice any unusual bleeding. Be careful brushing and flossing your teeth or using a toothpick because you may get an infection or bleed more easily. If you have any dental work done, tell your dentist you are receiving this medicine. Avoid taking products that contain aspirin, acetaminophen, ibuprofen, naproxen, or ketoprofen unless instructed by your doctor. These medicines may hide a fever. Women should inform their doctor if they wish to become pregnant or think they might be pregnant. There is a potential for serious side effects to an unborn child. Talk to your health care professional or pharmacist for more information. Do not breast-feed an infant while taking this medicine. What side effects may I notice from receiving this medicine? Side effects that you should report to your doctor or health care professional as soon as possible: -allergic reactions like skin rash, itching or hives, swelling of the face, lips, or tongue -low blood counts - this medicine may decrease the number of white blood cells, red blood cells and platelets. You may be at increased risk for infections and bleeding. -signs of infection - fever or chills, cough, sore throat, pain or difficulty passing urine -signs of decreased platelets or bleeding - bruising, pinpoint red spots on the skin, black, tarry stools, blood in the urine -signs of decreased red blood cells - unusually weak or tired, fainting spells, lightheadedness -breathing problems -chest pain -mouth sores -nausea and vomiting -pain, swelling, redness at site where injected -pain, tingling, numbness in the hands or feet -stomach pain -swelling of ankles, feet, hands -unusual bleeding Side effects that usually do not require medical attention (report to your doctor or health care professional if they continue or are bothersome): -constipation -diarrhea -hair loss -loss of appetite -stomach  upset This list may not describe all possible side effects. Call your doctor for medical advice about side effects. You may report side effects to FDA at 1-800-FDA-1088. Where should I keep my medicine? This drug is given in a hospital or clinic and will not be stored at home. NOTE: This sheet is a summary. It may not cover all possible information. If you have questions about this medicine, talk to your doctor, pharmacist, or health care provider.  2018 Elsevier/Gold Standard (2007-11-01 18:45:54)  Cisplatin injection What is this medicine? CISPLATIN (SIS pla tin) is a chemotherapy drug. It targets fast dividing cells, like cancer cells, and causes these cells to die. This medicine is used to treat many types of cancer like bladder, ovarian, and testicular cancers. This medicine may be used for other purposes; ask your health care provider or pharmacist if you have questions. COMMON BRAND NAME(S): Platinol, Platinol -AQ What should I tell my health care provider before I take this medicine? They need to know if you have any of these conditions: -blood disorders -hearing problems -kidney disease -recent or ongoing radiation therapy -an unusual or allergic reaction to cisplatin, carboplatin, other chemotherapy, other medicines, foods, dyes, or preservatives -pregnant or trying to get pregnant -breast-feeding How should I use this medicine? This drug is given as an infusion into a vein. It is administered in a hospital or clinic by a specially trained health care professional. Talk to your pediatrician regarding the use of this medicine in children. Special care may  be needed. Overdosage: If you think you have taken too much of this medicine contact a poison control center or emergency room at once. NOTE: This medicine is only for you. Do not share this medicine with others. What if I miss a dose? It is important not to miss a dose. Call your doctor or health care professional if you are  unable to keep an appointment. What may interact with this medicine? -dofetilide -foscarnet -medicines for seizures -medicines to increase blood counts like filgrastim, pegfilgrastim, sargramostim -probenecid -pyridoxine used with altretamine -rituximab -some antibiotics like amikacin, gentamicin, neomycin, polymyxin B, streptomycin, tobramycin -sulfinpyrazone -vaccines -zalcitabine Talk to your doctor or health care professional before taking any of these medicines: -acetaminophen -aspirin -ibuprofen -ketoprofen -naproxen This list may not describe all possible interactions. Give your health care provider a list of all the medicines, herbs, non-prescription drugs, or dietary supplements you use. Also tell them if you smoke, drink alcohol, or use illegal drugs. Some items may interact with your medicine. What should I watch for while using this medicine? Your condition will be monitored carefully while you are receiving this medicine. You will need important blood work done while you are taking this medicine. This drug may make you feel generally unwell. This is not uncommon, as chemotherapy can affect healthy cells as well as cancer cells. Report any side effects. Continue your course of treatment even though you feel ill unless your doctor tells you to stop. In some cases, you may be given additional medicines to help with side effects. Follow all directions for their use. Call your doctor or health care professional for advice if you get a fever, chills or sore throat, or other symptoms of a cold or flu. Do not treat yourself. This drug decreases your body's ability to fight infections. Try to avoid being around people who are sick. This medicine may increase your risk to bruise or bleed. Call your doctor or health care professional if you notice any unusual bleeding. Be careful brushing and flossing your teeth or using a toothpick because you may get an infection or bleed more easily.  If you have any dental work done, tell your dentist you are receiving this medicine. Avoid taking products that contain aspirin, acetaminophen, ibuprofen, naproxen, or ketoprofen unless instructed by your doctor. These medicines may hide a fever. Do not become pregnant while taking this medicine. Women should inform their doctor if they wish to become pregnant or think they might be pregnant. There is a potential for serious side effects to an unborn child. Talk to your health care professional or pharmacist for more information. Do not breast-feed an infant while taking this medicine. Drink fluids as directed while you are taking this medicine. This will help protect your kidneys. Call your doctor or health care professional if you get diarrhea. Do not treat yourself. What side effects may I notice from receiving this medicine? Side effects that you should report to your doctor or health care professional as soon as possible: -allergic reactions like skin rash, itching or hives, swelling of the face, lips, or tongue -signs of infection - fever or chills, cough, sore throat, pain or difficulty passing urine -signs of decreased platelets or bleeding - bruising, pinpoint red spots on the skin, black, tarry stools, nosebleeds -signs of decreased red blood cells - unusually weak or tired, fainting spells, lightheadedness -breathing problems -changes in hearing -gout pain -low blood counts - This drug may decrease the number of white blood cells, red blood  cells and platelets. You may be at increased risk for infections and bleeding. -nausea and vomiting -pain, swelling, redness or irritation at the injection site -pain, tingling, numbness in the hands or feet -problems with balance, movement -trouble passing urine or change in the amount of urine Side effects that usually do not require medical attention (report to your doctor or health care professional if they continue or are bothersome): -changes  in vision -loss of appetite -metallic taste in the mouth or changes in taste This list may not describe all possible side effects. Call your doctor for medical advice about side effects. You may report side effects to FDA at 1-800-FDA-1088. Where should I keep my medicine? This drug is given in a hospital or clinic and will not be stored at home. NOTE: This sheet is a summary. It may not cover all possible information. If you have questions about this medicine, talk to your doctor, pharmacist, or health care provider.  2018 Elsevier/Gold Standard (2007-09-27 14:40:54)   Implanted [facility] Guide An implanted port is a type of central line that is placed under the skin. Central lines are used to provide IV access when treatment or nutrition needs to be given through a person's veins. Implanted ports are used for long-term IV access. An implanted port may be placed because:  You need IV medicine that would be irritating to the small veins in your hands or arms.  You need long-term IV medicines, such as antibiotics.  You need IV nutrition for a long period.  You need frequent blood draws for lab tests.  You need dialysis.  Implanted ports are usually placed in the chest area, but they can also be placed in the upper arm, the abdomen, or the leg. An implanted port has two main parts:  Reservoir. The reservoir is round and will appear as a small, raised area under your skin. The reservoir is the part where a needle is inserted to give medicines or draw blood.  Catheter. The catheter is a thin, flexible tube that extends from the reservoir. The catheter is placed into a large vein. Medicine that is inserted into the reservoir goes into the catheter and then into the vein.  How will I care for my incision site? Do not get the incision site wet. Bathe or shower as directed by your health care provider. How is my port accessed? Special steps must be taken to access the port:  Before the  port is accessed, a numbing cream can be placed on the skin. This helps numb the skin over the port site.  Your health care provider uses a sterile technique to access the port. ? Your health care provider must put on a mask and sterile gloves. ? The skin over your port is cleaned carefully with an antiseptic and allowed to dry. ? The port is gently pinched between sterile gloves, and a needle is inserted into the port.  Only "non-coring" port needles should be used to access the port. Once the port is accessed, a blood return should be checked. This helps ensure that the port is in the vein and is not clogged.  If your port needs to remain accessed for a constant infusion, a clear (transparent) bandage will be placed over the needle site. The bandage and needle will need to be changed every week, or as directed by your health care provider.  Keep the bandage covering the needle clean and dry. Do not get it wet. Follow your health care  provider's instructions on how to take a shower or bath while the port is accessed.  If your port does not need to stay accessed, no bandage is needed over the port.  What is flushing? Flushing helps keep the port from getting clogged. Follow your health care provider's instructions on how and when to flush the port. Ports are usually flushed with saline solution or a medicine called heparin. The need for flushing will depend on how the port is used.  If the port is used for intermittent medicines or blood draws, the port will need to be flushed: ? After medicines have been given. ? After blood has been drawn. ? As part of routine maintenance.  If a constant infusion is running, the port may not need to be flushed.  How long will my port stay implanted? The port can stay in for as long as your health care provider thinks it is needed. When it is time for the port to come out, surgery will be done to remove it. The procedure is similar to the one performed  when the port was put in. When should I seek immediate medical care? When you have an implanted port, you should seek immediate medical care if:  You notice a bad smell coming from the incision site.  You have swelling, redness, or drainage at the incision site.  You have more swelling or pain at the port site or the surrounding area.  You have a fever that is not controlled with medicine.  This information is not intended to replace advice given to you by your health care provider. Make sure you discuss any questions you have with your health care provider. Document Released: 06/22/2005 Document Revised: 11/28/2015 Document Reviewed: 02/27/2013 Elsevier Interactive Patient Education  2017 [employer].

## 2017-02-20 ENCOUNTER — Ambulatory Visit (HOSPITAL_BASED_OUTPATIENT_CLINIC_OR_DEPARTMENT_OTHER): Payer: BLUE CROSS/BLUE SHIELD

## 2017-02-20 VITALS — BP 133/91 | HR 71 | Temp 98.1°F | Resp 17

## 2017-02-20 DIAGNOSIS — C221 Intrahepatic bile duct carcinoma: Secondary | ICD-10-CM | POA: Diagnosis not present

## 2017-02-20 DIAGNOSIS — C787 Secondary malignant neoplasm of liver and intrahepatic bile duct: Principal | ICD-10-CM

## 2017-02-20 DIAGNOSIS — Z5189 Encounter for other specified aftercare: Secondary | ICD-10-CM

## 2017-02-20 LAB — CANCER ANTIGEN 19-9: CAN 19-9: 44 U/mL — AB (ref 0–35)

## 2017-02-20 MED ORDER — PEGFILGRASTIM INJECTION 6 MG/0.6ML ~~LOC~~
6.0000 mg | PREFILLED_SYRINGE | Freq: Once | SUBCUTANEOUS | Status: AC
  Administered 2017-02-20: 6 mg via SUBCUTANEOUS

## 2017-02-20 NOTE — Patient Instructions (Signed)
Pegfilgrastim injection What is this medicine? PEGFILGRASTIM (PEG fil gra stim) is a long-acting granulocyte colony-stimulating factor that stimulates the growth of neutrophils, a type of white blood cell important in the body's fight against infection. It is used to reduce the incidence of fever and infection in patients with certain types of cancer who are receiving chemotherapy that affects the bone marrow, and to increase survival after being exposed to high doses of radiation. This medicine may be used for other purposes; ask your health care provider or pharmacist if you have questions. COMMON BRAND NAME(S): Neulasta What should I tell my health care provider before I take this medicine? They need to know if you have any of these conditions: -kidney disease -latex allergy -ongoing radiation therapy -sickle cell disease -skin reactions to acrylic adhesives (On-Body Injector only) -an unusual or allergic reaction to pegfilgrastim, filgrastim, other medicines, foods, dyes, or preservatives -pregnant or trying to get pregnant -breast-feeding How should I use this medicine? This medicine is for injection under the skin. If you get this medicine at home, you will be taught how to prepare and give the pre-filled syringe or how to use the On-body Injector. Refer to the patient Instructions for Use for detailed instructions. Use exactly as directed. Tell your healthcare provider immediately if you suspect that the On-body Injector may not have performed as intended or if you suspect the use of the On-body Injector resulted in a missed or partial dose. It is important that you put your used needles and syringes in a special sharps container. Do not put them in a trash can. If you do not have a sharps container, call your pharmacist or healthcare provider to get one. Talk to your pediatrician regarding the use of this medicine in children. While this drug may be prescribed for selected conditions,  precautions do apply. Overdosage: If you think you have taken too much of this medicine contact a poison control center or emergency room at once. NOTE: This medicine is only for you. Do not share this medicine with others. What if I miss a dose? It is important not to miss your dose. Call your doctor or health care professional if you miss your dose. If you miss a dose due to an On-body Injector failure or leakage, a new dose should be administered as soon as possible using a single prefilled syringe for manual use. What may interact with this medicine? Interactions have not been studied. Give your health care provider a list of all the medicines, herbs, non-prescription drugs, or dietary supplements you use. Also tell them if you smoke, drink alcohol, or use illegal drugs. Some items may interact with your medicine. This list may not describe all possible interactions. Give your health care provider a list of all the medicines, herbs, non-prescription drugs, or dietary supplements you use. Also tell them if you smoke, drink alcohol, or use illegal drugs. Some items may interact with your medicine. What should I watch for while using this medicine? You may need blood work done while you are taking this medicine. If you are going to need a MRI, CT scan, or other procedure, tell your doctor that you are using this medicine (On-Body Injector only). What side effects may I notice from receiving this medicine? Side effects that you should report to your doctor or health care professional as soon as possible: -allergic reactions like skin rash, itching or hives, swelling of the face, lips, or tongue -dizziness -fever -pain, redness, or irritation at site   where injected -pinpoint red spots on the skin -red or dark-brown urine -shortness of breath or breathing problems -stomach or side pain, or pain at the shoulder -swelling -tiredness -trouble passing urine or change in the amount of urine Side  effects that usually do not require medical attention (report to your doctor or health care professional if they continue or are bothersome): -bone pain -muscle pain This list may not describe all possible side effects. Call your doctor for medical advice about side effects. You may report side effects to FDA at 1-800-FDA-1088. Where should I keep my medicine? Keep out of the reach of children. Store pre-filled syringes in a refrigerator between 2 and 8 degrees C (36 and 46 degrees F). Do not freeze. Keep in carton to protect from light. Throw away this medicine if it is left out of the refrigerator for more than 48 hours. Throw away any unused medicine after the expiration date. NOTE: This sheet is a summary. It may not cover all possible information. If you have questions about this medicine, talk to your doctor, pharmacist, or health care provider.  2018 Elsevier/Gold Standard (2016-06-18 12:58:03)  

## 2017-02-26 NOTE — Progress Notes (Addendum)
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 Widespread hepatic metastasis, without abdominal primary identified. 2. Right twelfth rib metastasis. 3. Soft tissue fullness within the cecum, not entirely imaged. This could represent prominent stool or less likely a colon primary. Consider dedicated chest and pelvic CT or PET to evaluate for primary. 4. Suspicious right cardiophrenic angle adenopathy. Indeterminate/equivocal portal caval node. 5. Left adrenal adenoma. These results will be called to the ordering clinician or representative by the Radiologist Assistant, and communication documented in the PACS or zVision Dashboard.      12/24/2016 Procedure    Upper Endoscopy Nodular distal gastritis, biospied.      12/24/2016 Procedure    Colonoscopy  - The entire examined colon is normal on direct and retroflexion views. - No polyps or cancers.      12/24/2016 Pathology Results     Diagnosis Stomach, biopsy, distal - REACTIVE GASTROPATHY - NO MALIGNANCY IDENTIFIED      12/29/2016 Imaging    CT CAP w contrast  IMPRESSION: 1. No definite CT findings to suggest primary source of the apparent hepatic metastases. Gallbladder is  contracted and there is some wall irregularity in the body of the gallbladder but no gross gallbladder mass lesion is evident. No biliary dilatation. Previous MRI raise the question of the cecal lesion, but there is no evidence for gross cecal mass by CT today. There is some minimal fullness in the distal stomach, but this is a fairly typical appearance on CT and given the smooth appearance of the distal gastric wall and symmetric tissue fullness, neoplasm is felt to be not likely. 2. Hepatic metastases as seen on recent abdominal MRI. 3. Borderline to mild gastrohepatic, hepatoduodenal ligament, and retroperitoneal lymphadenopathy.  4. PET-CT may prove helpful to further evaluate.      01/01/2017 Imaging    US Biopsy  IMPRESSION: Status post ultrasound-guided biopsy of right liver tumor. Tissue specimen sent to pathology for complete histopathologic analysis.      01/01/2017 Procedure    Ultrasound-guided liver biopsy      01/01/2017 Pathology Results    Liver biopsy showed adenocarcinoma, consistent with cholangiocarcinoma.       01/01/2017 Initial Diagnosis    Cholangiocarcinoma metastatic to liver ([place])      01/27/2017 Imaging    NM PET scan IMPRESSION: Dominant geographic mass in the right hepatic lobe is heterogeneous but hypermetabolic with maximum SUV of 7.5. Likely metastatic hypermetabolic foci are present primarily in the right hepatic lobe but also involving the medial and lateral segments of the left hepatic lobe. Small lesion medially in the right twelfth rib subcostal space with very low metabolic activity with maximum SUV of 1.9, previously seen on the prior MRI of 12/17/2016. I am skeptical that this is a metastatic lesion. This  could well be a schwannoma or small vascular malformation. This does appear increased in size compared to 6/20 12/2012, and merits surveillance. Mild splenomegaly but without focal splenic hypermetabolic activity. 2.0 by 1.6 cm  hypodense right thyroid mass is mildly hypermetabolic. A significant minority of such lesions can turn out to represent thyroid cancer. Thyroid sonography is recommended for further characterization. Other imaging findings of potential clinical significance: Suspected cholelithiasis. Aortic Atherosclerosis (ICD10-I70.0).      HISTORY OF PRESENTING ILLNESS (01/12/17):  Cheryl [name] 61 y.o. female is here because of newly diagnosed Cholangiocarcinoma. She was referred by her gastroenterologist Dr. Ardis Hughs. She presents to my clinic with her husband today.  She has had vague, intermittent abdominal pain, and a total of 10 pound weight loss since December. She was originally everted by her primary care physician, and lab test showed elevated liver function tests. She underwent abdominal MRI with and without contrast on 12/17/2016, which showed wide spread hepatic metastasis, without abdominal primary identified. Soft tissue fullness within the cecum, indeterminate. She was referred to Dr. Ardis Hughs, and had negative colonoscopy and endoscopy on 12/24/2016. She underwent ultrasound guided liver biopsy on 01/01/2017, pathology showed adenocarcinoma, consistent with glandular carcinoma.   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   She reports some pain that started worsened days after her biopsy. She has nausea commonly. She is eating well. She reports fatigue and constipation and she takes [place] to help. Her bowel movements are about every 3 days. She denies chest pain, cough or SOB. She does mammograms normally with her most recent one being in June 2017.  She denies any history of cancer.   CURRENT THERAPY: First line chemo cisplatin and gemcitabine. chemotherapy q week x 2 weeks with 1 week off.  INTERVAL HISTORY:  LAVELLA MYREN returns to the clinic today for follow up following cycle 1 of Cisplatin and Gemzar days 1 and 8 of 21 day cycle.  She tolerated her first cycle well.  She had mild fatigue the first day after treatment which is now resolved. She has constipation at baseline, a bowel movement every 2-3 days. She uses a laxative when necessary. She exercises by walking daily. She has a good appetite but is down 3 pounds since last visit. She has been encouraged to eat more. Today she feels well denying fatigue, fever , abdominal pain, nausea, vomiting or diarrhea, no bleeding, no chest pain, cough or shortness of breath, and no lower extremity swelling. She denies numbness or tingling to hands or feet. She has been receiving chemotherapy peripherally and is getting PAC placed next week 03/09/17. We reviewed the procedure and discussed the use of EMLA cream prior to port access.   Started Lexapro 02/19/17. Her mood appears stable.Pt is not currently followed by a therapist. [place] of [place], this is approximately 5hrs by car ride. The patient is on coumadin, monitored routinely at [facility], she reports INR is in normal range.   MEDICAL HISTORY:  Past Medical History:  Diagnosis Date  . History of blood clots   . Liver mass   . PONV (postoperative nausea and vomiting)    nausea   SURGICAL HISTORY: Past Surgical History:  Procedure Laterality Date  . ABDOMINAL HYSTERECTOMY     complete  . CESAREAN SECTION     x 1  . COLONOSCOPY WITH PROPOFOL N/A 12/24/2016   Procedure: COLONOSCOPY WITH PROPOFOL;  Surgeon:  Milus Banister, MD;  Location: [facility];  Service: Endoscopy;  Laterality: N/A;  . ESOPHAGOGASTRODUODENOSCOPY (EGD) WITH PROPOFOL N/A 12/24/2016   Procedure: ESOPHAGOGASTRODUODENOSCOPY (EGD) WITH PROPOFOL;  Surgeon: Milus Banister, MD;  Location: [facility];  Service: Endoscopy;  Laterality: N/A;   SOCIAL HISTORY: Social History   Social History  . Marital status: Married    Spouse name: N/A  . Number of children: 1  . Years of education: N/A   Occupational History  . Not on file.    Social History Main Topics  . Smoking status: Former Smoker    Packs/day: 1.00    Years: 15.00    Types: Cigarettes  . Smokeless tobacco: Never Used     Comment: quit 2010  . Alcohol use No  . Drug use: No  . Sexual activity: Not on file   Other Topics Concern  . Not on file   Social History Narrative  . No narrative on file   FAMILY HISTORY: Family History  Problem Relation Age of Onset  . Stomach cancer Mother   . Emphysema Father    ALLERGIES:  is allergic to dilaudid [hydromorphone hcl]; sulfa antibiotics; and tramadol.  MEDICATIONS:  Current Outpatient Prescriptions  Medication Sig Dispense Refill  . acetaminophen (TYLENOL) 500 MG tablet Take 1,000 mg by mouth every 6 (six) hours as needed (for pain.).    Marland Kitchen escitalopram (LEXAPRO) 10 MG tablet Take 1 tablet (10 mg total) by mouth daily. 30 tablet 2  . ondansetron (ZOFRAN) 8 MG tablet Take 1 tablet (8 mg total) by mouth 2 (two) times daily as needed. Start on the third day after chemotherapy. 30 tablet 1  . prochlorperazine (COMPAZINE) 10 MG tablet Take 1 tablet (10 mg total) by mouth every 6 (six) hours as needed (Nausea or vomiting). 30 tablet 1  . traMADol (ULTRAM) 50 MG tablet Take 1 tablet (50 mg total) by mouth every 6 (six) hours as needed. 30 tablet 1  . lidocaine-prilocaine (EMLA) cream Apply 1 application topically as needed. 30 g 2  . warfarin (COUMADIN) 4 MG tablet Take 4 mg by mouth daily at 6 PM.      No current facility-administered medications for this visit.     REVIEW OF SYSTEMS:  Constitutional: Denies fatigue, fevers, chills or abnormal night sweats Eyes: Denies blurriness of vision, double vision or watery eyes Ears, nose, mouth, throat, and face: Denies mucositis or sore throat Respiratory: Denies cough, dyspnea or wheezes Cardiovascular: Denies palpitation, chest discomfort or lower extremity swelling Gastrointestinal:  (+) constipation, controlled with PRN laxative. Denies nausea, vomiting,  diarrhea, heartburn or change in bowel habits Skin: Denies abnormal skin rashes Lymphatics: Denies new lymphadenopathy or easy bruising Neurological:Denies numbness, tingling or new weaknesses Behavioral/Psych: (+) depression MSK: Denies arthralgias.  All other systems were reviewed with the patient and are negative.  PHYSICAL EXAMINATION:  ECOG PERFORMANCE STATUS: 1 - Symptomatic but completely ambulatory BP 131/68 (BP Location: Left Arm, Patient Position: Sitting)   Pulse 77   Temp 98.3 F (36.8 C) (Oral)   Resp 18   Ht 5' 8.5" (1.74 m)   Wt 131 lb 1.6 oz (59.5 kg)   SpO2 99%   BMI 19.64 kg/m  GENERAL:alert, no distress and comfortable SKIN: skin color, texture, turgor are normal, no rashes or significant lesions EYES: normal, conjunctiva are pink and non-injected, sclera clear OROPHARYNX:no exudate, no erythema and lips, buccal mucosa, and tongue normal  NECK: supple, no palpable thyroid nodules, non-tender LYMPH:  no palpable  cervical or supraclavicular lymphadenopathy LUNGS: clear to auscultation bilaterally, normal breathing effort HEART: regular rate & rhythm, S1 and S2 present, no murmurs; no lower extremity edema ABDOMEN:abdomen soft, non-tender and normal bowel sounds. Enlarged liver to 4 cm below rib cage, non-tender to palpation. No palpable masses.  Musculoskeletal:no cyanosis of digits and no clubbing  PSYCH: alert & oriented x 3 with fluent speech NEURO: no focal motor/sensory deficits  LABORATORY DATA:  I have reviewed the data as listed  CBC Latest Ref Rng & Units 03/05/2017 02/19/2017 02/12/2017  WBC 3.9 - 10.3 10e3/uL 19.6(H) 6.7 9.7  Hemoglobin 11.6 - 15.9 g/dL 12.8 13.0 14.2  Hematocrit 34.8 - 46.6 % 38.2 39.2 42.3  Platelets 145 - 400 10e3/uL 374 136(L) 173   CMP Latest Ref Rng & Units 03/05/2017 02/19/2017 02/12/2017  Glucose 70 - 140 mg/dl 93 82 132  BUN 7.0 - 26.0 mg/dL 14.3 14.9 14.8  Creatinine 0.6 - 1.1 mg/dL 0.8 0.7 0.8  Sodium 136 - 145 mEq/L 138  137 139  Potassium 3.5 - 5.1 mEq/L 3.9 4.3 3.8  Chloride 101 - 111 mmol/L - - -  CO2 22 - 29 mEq/L 25 27 26   Calcium 8.4 - 10.4 mg/dL 9.9 9.8 9.9  Total Protein 6.4 - 8.3 g/dL 7.3 7.3 7.6  Total Bilirubin 0.20 - 1.20 mg/dL 0.47 0.71 0.97  Alkaline Phos 40 - 150 U/L 257(H) 260(H) 307(H)  AST 5 - 34 U/L 28 42(H) 51(H)  ALT 0 - 55 U/L 24 64(H) 46   CA 19.9: 01/22/17: 44 02/19/17: 44  PATHOLOGY: Diagnosis 12/24/2016 Stomach, biopsy, distal - REACTIVE GASTROPATHY - NO MALIGNANCY IDENTIFIED  Diagnosis 01/01/2017 Liver, needle/core biopsy ADENOCARCINOMA, CONSISTENT WITH CHOLANGIOCARCINOMA.  RADIOGRAPHIC STUDIES: I have personally reviewed the radiological images as listed and agreed with the findings in the report. No results found.    ASSESSMENT & PLAN: Cheryl [name] is a  61 y.o. female, with past medical history of DVT on Coumadin, presented with intermittent abdominal pain, and 10 pound weight loss. Completed cycle 1 cisplatin and gemzar on day 1 and day 8 of 21 day cycle. Tolerated well with mild fatigue and constipation. Here today prior to cycle 2.   1. Intrahepatic cholangiocarcinoma in right lobe, with liver metastasis   -MD previously reviewed CT, MRI scan findings and liver biopsy result with patient and her husband in details. -Her liver biopsy confirmed adenocarcinoma, consistent with cholangiocarcinoma. Her EGD and endoscopy was negative for primary tumor. CT chest, abdomen and pelvis was negative for other primary or metastasis. -CA19.9 elevated at 44 at diagnosis. -Her case was previously reviewed in GI tumor Board, the T12 metastasis reported by MRI was not highly suspicious for bone metastasis; local splenic findings on the CT scan were reviewed in the tumor board. -MD reviewed PET scan images with patient in person, unfortunately she has extensive liver metastasis, including metastatic lesion in the left lobe, no other distant metastasis. -MD previously discussed with  Surgeon Dr. Barry Dienes who doesn't think that surgery resection is feasible due to left lobe liver metastasis.  -The patient began systemic chemotherapy with palliative intent per discussion with MD.  -Cycle 1, days 1 and 8: 02/12/17, 02/19/17 -Cycle 2 day 1 03/04/17, day 8 planned 03/12/17 -Encouraged to exercise/ambulate to combat treatment-related fatigue.  -Today's lab reviewed, she has leukocytosis, likely secondary to Neulasta, I was unremarkable, we'll proceed cycle 2 chemotherapy today. she received neulasta with cycle 1. We will hold neulasta with cycle 2.  2. Constipation -  Encouraged her to drink more water or try prune juice, uses stool softener and laxative as needed.  5. Nutrition -f/u with dietician   -she has lost 3 pounds since last visit, encouraged to eat more  6. Depression  -Started 10mg  Lexapro daily on 02/19/17.   -Sent referral to social work today to assist with establishing a therapist.   PLAN -proceed with cycle 2 day 1 chemo today -hold neulasta with this cycle -continue bowel regimen for constipation -increase diet and continue dietician f/u for nutrition -referral sent to social work to help establish care with therapist -lab, flush, chemo in 3 and 4 weeks -f/u 3 weeks before cycle 3   All questions were answered. The patient knows to call the clinic with any problems, questions or concerns. I spent 20 minutes counseling the patient face to face. The total time spent in the appointment was 25 minutes and more than 50% was on counseling.  Cira Rue, AGNP-C 03/05/2017  Attending addendum I have seen the patient, examined her. I agree with the assessment and and plan and have edited the notes.   This document serves as a record of services personally performed by Truitt Merle, MD. It was created on her behalf by Reola Mosher, a trained medical scribe. The creation of this record is based on the scribe's personal observations and the provider's statements to  them. This document has been checked and approved by the attending provider.    Truitt Merle, MD 03/05/2017

## 2017-03-04 ENCOUNTER — Other Ambulatory Visit: Payer: Self-pay | Admitting: Radiology

## 2017-03-05 ENCOUNTER — Other Ambulatory Visit: Payer: Self-pay | Admitting: Radiology

## 2017-03-05 ENCOUNTER — Other Ambulatory Visit (HOSPITAL_BASED_OUTPATIENT_CLINIC_OR_DEPARTMENT_OTHER): Payer: BLUE CROSS/BLUE SHIELD

## 2017-03-05 ENCOUNTER — Ambulatory Visit (HOSPITAL_BASED_OUTPATIENT_CLINIC_OR_DEPARTMENT_OTHER): Payer: BLUE CROSS/BLUE SHIELD | Admitting: Hematology

## 2017-03-05 ENCOUNTER — Telehealth: Payer: Self-pay | Admitting: Hematology

## 2017-03-05 ENCOUNTER — Other Ambulatory Visit: Payer: BLUE CROSS/BLUE SHIELD

## 2017-03-05 ENCOUNTER — Encounter: Payer: BLUE CROSS/BLUE SHIELD | Admitting: Nutrition

## 2017-03-05 ENCOUNTER — Ambulatory Visit (HOSPITAL_BASED_OUTPATIENT_CLINIC_OR_DEPARTMENT_OTHER): Payer: BLUE CROSS/BLUE SHIELD

## 2017-03-05 ENCOUNTER — Encounter: Payer: Self-pay | Admitting: Hematology

## 2017-03-05 ENCOUNTER — Ambulatory Visit: Payer: BLUE CROSS/BLUE SHIELD | Admitting: Nutrition

## 2017-03-05 VITALS — BP 131/68 | HR 77 | Temp 98.3°F | Resp 18 | Ht 68.5 in | Wt 131.1 lb

## 2017-03-05 DIAGNOSIS — Z7901 Long term (current) use of anticoagulants: Secondary | ICD-10-CM | POA: Diagnosis not present

## 2017-03-05 DIAGNOSIS — Z86718 Personal history of other venous thrombosis and embolism: Secondary | ICD-10-CM

## 2017-03-05 DIAGNOSIS — C787 Secondary malignant neoplasm of liver and intrahepatic bile duct: Secondary | ICD-10-CM

## 2017-03-05 DIAGNOSIS — C221 Intrahepatic bile duct carcinoma: Secondary | ICD-10-CM

## 2017-03-05 DIAGNOSIS — F329 Major depressive disorder, single episode, unspecified: Secondary | ICD-10-CM

## 2017-03-05 DIAGNOSIS — Z8659 Personal history of other mental and behavioral disorders: Secondary | ICD-10-CM

## 2017-03-05 DIAGNOSIS — Z5111 Encounter for antineoplastic chemotherapy: Secondary | ICD-10-CM | POA: Diagnosis not present

## 2017-03-05 DIAGNOSIS — E46 Unspecified protein-calorie malnutrition: Secondary | ICD-10-CM | POA: Diagnosis not present

## 2017-03-05 DIAGNOSIS — K5900 Constipation, unspecified: Secondary | ICD-10-CM | POA: Diagnosis not present

## 2017-03-05 LAB — COMPREHENSIVE METABOLIC PANEL
ALT: 24 U/L (ref 0–55)
AST: 28 U/L (ref 5–34)
Albumin: 3.5 g/dL (ref 3.5–5.0)
Alkaline Phosphatase: 257 U/L — ABNORMAL HIGH (ref 40–150)
Anion Gap: 9 mEq/L (ref 3–11)
BUN: 14.3 mg/dL (ref 7.0–26.0)
CHLORIDE: 104 meq/L (ref 98–109)
CO2: 25 meq/L (ref 22–29)
Calcium: 9.9 mg/dL (ref 8.4–10.4)
Creatinine: 0.8 mg/dL (ref 0.6–1.1)
EGFR: 85 mL/min/{1.73_m2} — ABNORMAL LOW (ref >90)
Glucose: 93 mg/dl (ref 70–140)
POTASSIUM: 3.9 meq/L (ref 3.5–5.1)
SODIUM: 138 meq/L (ref 136–145)
Total Bilirubin: 0.47 mg/dL (ref 0.20–1.20)
Total Protein: 7.3 g/dL (ref 6.4–8.3)

## 2017-03-05 LAB — MAGNESIUM: MAGNESIUM: 2.2 mg/dL (ref 1.5–2.5)

## 2017-03-05 LAB — CBC WITH DIFFERENTIAL/PLATELET
BASO%: 0.6 % (ref 0.0–2.0)
BASOS ABS: 0.1 10*3/uL (ref 0.0–0.1)
EOS%: 0.3 % (ref 0.0–7.0)
Eosinophils Absolute: 0.1 10*3/uL (ref 0.0–0.5)
HEMATOCRIT: 38.2 % (ref 34.8–46.6)
HGB: 12.8 g/dL (ref 11.6–15.9)
LYMPH%: 11.1 % — AB (ref 14.0–49.7)
MCH: 29.1 pg (ref 25.1–34.0)
MCHC: 33.4 g/dL (ref 31.5–36.0)
MCV: 87.1 fL (ref 79.5–101.0)
MONO#: 0.9 10*3/uL (ref 0.1–0.9)
MONO%: 4.6 % (ref 0.0–14.0)
NEUT#: 16.3 10*3/uL — ABNORMAL HIGH (ref 1.5–6.5)
NEUT%: 83.4 % — AB (ref 38.4–76.8)
Platelets: 374 10*3/uL (ref 145–400)
RBC: 4.39 10*6/uL (ref 3.70–5.45)
RDW: 14.6 % — ABNORMAL HIGH (ref 11.2–14.5)
WBC: 19.6 10*3/uL — ABNORMAL HIGH (ref 3.9–10.3)
lymph#: 2.2 10*3/uL (ref 0.9–3.3)

## 2017-03-05 MED ORDER — SODIUM CHLORIDE 0.9 % IV SOLN
Freq: Once | INTRAVENOUS | Status: AC
  Administered 2017-03-05: 11:00:00 via INTRAVENOUS

## 2017-03-05 MED ORDER — PALONOSETRON HCL INJECTION 0.25 MG/5ML
0.2500 mg | Freq: Once | INTRAVENOUS | Status: AC
  Administered 2017-03-05: 0.25 mg via INTRAVENOUS

## 2017-03-05 MED ORDER — PALONOSETRON HCL INJECTION 0.25 MG/5ML
INTRAVENOUS | Status: AC
  Filled 2017-03-05: qty 5

## 2017-03-05 MED ORDER — POTASSIUM CHLORIDE 2 MEQ/ML IV SOLN
Freq: Once | INTRAVENOUS | Status: AC
  Administered 2017-03-05: 12:00:00 via INTRAVENOUS
  Filled 2017-03-05: qty 10

## 2017-03-05 MED ORDER — SODIUM CHLORIDE 0.9 % IV SOLN
1000.0000 mg/m2 | Freq: Once | INTRAVENOUS | Status: AC
  Administered 2017-03-05: 1710 mg via INTRAVENOUS
  Filled 2017-03-05: qty 44.97

## 2017-03-05 MED ORDER — LIDOCAINE-PRILOCAINE 2.5-2.5 % EX CREA: 1.0000 "application " | TOPICAL_CREAM | CUTANEOUS | 2 refills | Status: AC | PRN

## 2017-03-05 MED ORDER — FOSAPREPITANT DIMEGLUMINE INJECTION 150 MG
Freq: Once | INTRAVENOUS | Status: AC
  Administered 2017-03-05: 14:00:00 via INTRAVENOUS
  Filled 2017-03-05: qty 5

## 2017-03-05 MED ORDER — SODIUM CHLORIDE 0.9 % IV SOLN
25.0000 mg/m2 | Freq: Once | INTRAVENOUS | Status: AC
  Administered 2017-03-05: 43 mg via INTRAVENOUS
  Filled 2017-03-05: qty 43

## 2017-03-05 NOTE — Patient Instructions (Signed)
[facility] Discharge Instructions for Patients Receiving Chemotherapy  Today you received the following chemotherapy agents:  Gemzar, Cisplatin To help prevent nausea and vomiting after your treatment, we encourage you to take your nausea medication.    If you develop nausea and vomiting that is not controlled by your nausea medication, call the clinic.   BELOW ARE SYMPTOMS THAT SHOULD BE REPORTED IMMEDIATELY:  *FEVER GREATER THAN 100.5 F  *CHILLS WITH OR WITHOUT FEVER  NAUSEA AND VOMITING THAT IS NOT CONTROLLED WITH YOUR NAUSEA MEDICATION  *UNUSUAL SHORTNESS OF BREATH  *UNUSUAL BRUISING OR BLEEDING  TENDERNESS IN MOUTH AND THROAT WITH OR WITHOUT PRESENCE OF ULCERS  *URINARY PROBLEMS  *BOWEL PROBLEMS  UNUSUAL RASH Items with * indicate a potential emergency and should be followed up as soon as possible.  Feel free to call the clinic you have any questions or concerns. The clinic phone number is [phone].  Please show the [facility] at check-in to the Emergency Department and triage nurse.

## 2017-03-05 NOTE — Progress Notes (Signed)
Nutrition follow up completed with patient during infusion for cholangiocarcinoma with mets to her liver. Weight decreased and documented as 131.1 pounds decreased from 134.6 pounds. Reports constipation is improved with prunes and prune juice. Appetite improved. She drinks one Ensure daily.  Nutrition Diagnosis: Unintended weight loss continues.  Intervention: Educated patient to increase Ensure to BID or use Ensure Plus once daily. Reviewed high calorie, high protein foods. Teach back method used.  Monitoring, Evaluation, Goals: Patient will increase calories and protein to minimize further weight loss.  Next visit: Friday, Sept 28, during infusion.

## 2017-03-05 NOTE — Telephone Encounter (Signed)
Gave patient avs and calender with appts from 8/31 los

## 2017-03-06 LAB — CANCER ANTIGEN 19-9: CA 19-9: 42 U/mL — ABNORMAL HIGH (ref 0–35)

## 2017-03-08 ENCOUNTER — Encounter: Payer: Self-pay | Admitting: Radiology

## 2017-03-08 DIAGNOSIS — Z86718 Personal history of other venous thrombosis and embolism: Secondary | ICD-10-CM | POA: Insufficient documentation

## 2017-03-09 ENCOUNTER — Ambulatory Visit (HOSPITAL_COMMUNITY)
Admission: RE | Admit: 2017-03-09 | Discharge: 2017-03-09 | Disposition: A | Discharge status: Home / Self Care | Payer: BLUE CROSS/BLUE SHIELD | Source: Ambulatory Visit | Attending: Hematology | Admitting: Hematology

## 2017-03-09 ENCOUNTER — Encounter (HOSPITAL_COMMUNITY): Payer: Self-pay

## 2017-03-09 ENCOUNTER — Other Ambulatory Visit: Payer: Self-pay | Admitting: Hematology

## 2017-03-09 DIAGNOSIS — Z8509 Personal history of malignant neoplasm of other digestive organs: Secondary | ICD-10-CM | POA: Diagnosis not present

## 2017-03-09 DIAGNOSIS — Z5111 Encounter for antineoplastic chemotherapy: Secondary | ICD-10-CM | POA: Diagnosis not present

## 2017-03-09 DIAGNOSIS — C787 Secondary malignant neoplasm of liver and intrahepatic bile duct: Secondary | ICD-10-CM

## 2017-03-09 DIAGNOSIS — Z7901 Long term (current) use of anticoagulants: Secondary | ICD-10-CM | POA: Diagnosis not present

## 2017-03-09 DIAGNOSIS — Z86718 Personal history of other venous thrombosis and embolism: Secondary | ICD-10-CM | POA: Insufficient documentation

## 2017-03-09 DIAGNOSIS — C221 Intrahepatic bile duct carcinoma: Secondary | ICD-10-CM | POA: Diagnosis not present

## 2017-03-09 HISTORY — PX: IR FLUORO GUIDE PORT INSERTION RIGHT: IMG5741

## 2017-03-09 HISTORY — PX: IR US GUIDE VASC ACCESS RIGHT: IMG2390

## 2017-03-09 LAB — CBC
HEMATOCRIT: 32.9 % — AB (ref 36.0–46.0)
Hemoglobin: 11.1 g/dL — ABNORMAL LOW (ref 12.0–15.0)
MCH: 28.8 pg (ref 26.0–34.0)
MCHC: 33.7 g/dL (ref 30.0–36.0)
MCV: 85.5 fL (ref 78.0–100.0)
Platelets: 375 10*3/uL (ref 150–400)
RBC: 3.85 MIL/uL — ABNORMAL LOW (ref 3.87–5.11)
RDW: 14.9 % (ref 11.5–15.5)
WBC: 9.9 10*3/uL (ref 4.0–10.5)

## 2017-03-09 LAB — APTT: aPTT: 26 seconds (ref 24–36)

## 2017-03-09 LAB — BASIC METABOLIC PANEL
ANION GAP: 8 (ref 5–15)
BUN: 17 mg/dL (ref 6–20)
CALCIUM: 9.1 mg/dL (ref 8.9–10.3)
CO2: 26 mmol/L (ref 22–32)
Chloride: 102 mmol/L (ref 101–111)
Creatinine, Ser: 0.56 mg/dL (ref 0.44–1.00)
GFR calc Af Amer: >60 mL/min (ref >60)
GFR calc non Af Amer: >60 mL/min (ref >60)
GLUCOSE: 93 mg/dL (ref 65–99)
Potassium: 3.9 mmol/L (ref 3.5–5.1)
Sodium: 136 mmol/L (ref 135–145)

## 2017-03-09 LAB — PROTIME-INR
INR: 1.04
Prothrombin Time: 13.5 seconds (ref 11.4–15.2)

## 2017-03-09 MED ORDER — CEFAZOLIN SODIUM-DEXTROSE 2-4 GM/100ML-% IV SOLN
2.0000 g | INTRAVENOUS | Status: AC
  Administered 2017-03-09: 2 g via INTRAVENOUS

## 2017-03-09 MED ORDER — LIDOCAINE-EPINEPHRINE (PF) 2 %-1:200000 IJ SOLN
INTRAMUSCULAR | Status: AC
  Filled 2017-03-09: qty 20

## 2017-03-09 MED ORDER — FENTANYL CITRATE (PF) 100 MCG/2ML IJ SOLN
INTRAMUSCULAR | Status: AC | PRN
  Administered 2017-03-09 (×2): 50 ug via INTRAVENOUS

## 2017-03-09 MED ORDER — MIDAZOLAM HCL 2 MG/2ML IJ SOLN
INTRAMUSCULAR | Status: AC | PRN
  Administered 2017-03-09 (×2): 1 mg via INTRAVENOUS

## 2017-03-09 MED ORDER — LIDOCAINE-EPINEPHRINE (PF) 2 %-1:200000 IJ SOLN
INTRAMUSCULAR | Status: AC | PRN
Start: 2017-03-09 — End: 2017-03-09
  Administered 2017-03-09: 10 mL via INTRADERMAL

## 2017-03-09 MED ORDER — SODIUM CHLORIDE 0.9 % IV SOLN
INTRAVENOUS | Status: DC
  Administered 2017-03-09: 14:00:00 via INTRAVENOUS

## 2017-03-09 MED ORDER — CEFAZOLIN SODIUM-DEXTROSE 2-4 GM/100ML-% IV SOLN
INTRAVENOUS | Status: AC
  Administered 2017-03-09: 2 g via INTRAVENOUS
  Filled 2017-03-09: qty 100

## 2017-03-09 MED ORDER — HEPARIN SOD (PORK) LOCK FLUSH 100 UNIT/ML IV SOLN
INTRAVENOUS | Status: AC
  Filled 2017-03-09: qty 5

## 2017-03-09 MED ORDER — FENTANYL CITRATE (PF) 100 MCG/2ML IJ SOLN
INTRAMUSCULAR | Status: AC
  Filled 2017-03-09: qty 4

## 2017-03-09 MED ORDER — MIDAZOLAM HCL 2 MG/2ML IJ SOLN
INTRAMUSCULAR | Status: AC
  Filled 2017-03-09: qty 4

## 2017-03-09 MED ORDER — HEPARIN SOD (PORK) LOCK FLUSH 100 UNIT/ML IV SOLN
INTRAVENOUS | Status: AC | PRN
  Administered 2017-03-09: 500 [IU] via INTRAVENOUS

## 2017-03-09 NOTE — Discharge Instructions (Signed)
Implanted Port Home Guide °An implanted port is a type of central line that is placed under the skin. Central lines are used to provide IV access when treatment or nutrition needs to be given through a person’s veins. Implanted ports are used for long-term IV access. An implanted port may be placed because: °· You need IV medicine that would be irritating to the small veins in your hands or arms. °· You need long-term IV medicines, such as antibiotics. °· You need IV nutrition for a long period. °· You need frequent blood draws for lab tests. °· You need dialysis. ° °Implanted ports are usually placed in the chest area, but they can also be placed in the upper arm, the abdomen, or the leg. An implanted port has two main parts: °· Reservoir. The reservoir is round and will appear as a small, raised area under your skin. The reservoir is the part where a needle is inserted to give medicines or draw blood. °· Catheter. The catheter is a thin, flexible tube that extends from the reservoir. The catheter is placed into a large vein. Medicine that is inserted into the reservoir goes into the catheter and then into the vein. ° °How will I care for my incision site? °Do not get the incision site wet. Bathe or shower as directed by your health care provider. °How is my port accessed? °Special steps must be taken to access the port: °· Before the port is accessed, a numbing cream can be placed on the skin. This helps numb the skin over the port site. °· Your health care provider uses a sterile technique to access the port. °? Your health care provider must put on a mask and sterile gloves. °? The skin over your port is cleaned carefully with an antiseptic and allowed to dry. °? The port is gently pinched between sterile gloves, and a needle is inserted into the port. °· Only "non-coring" port needles should be used to access the port. Once the port is accessed, a blood return should be checked. This helps ensure that the port  is in the vein and is not clogged. °· If your port needs to remain accessed for a constant infusion, a clear (transparent) bandage will be placed over the needle site. The bandage and needle will need to be changed every week, or as directed by your health care provider. °· Keep the bandage covering the needle clean and dry. Do not get it wet. Follow your health care provider’s instructions on how to take a shower or bath while the port is accessed. °· If your port does not need to stay accessed, no bandage is needed over the port. ° °What is flushing? °Flushing helps keep the port from getting clogged. Follow your health care provider’s instructions on how and when to flush the port. Ports are usually flushed with saline solution or a medicine called heparin. The need for flushing will depend on how the port is used. °· If the port is used for intermittent medicines or blood draws, the port will need to be flushed: °? After medicines have been given. °? After blood has been drawn. °? As part of routine maintenance. °· If a constant infusion is running, the port may not need to be flushed. ° °How long will my port stay implanted? °The port can stay in for as long as your health care provider thinks it is needed. When it is time for the port to come out, surgery will be   done to remove it. The procedure is similar to the one performed when the port was put in. °When should I seek immediate medical care? °When you have an implanted port, you should seek immediate medical care if: °· You notice a bad smell coming from the incision site. °· You have swelling, redness, or drainage at the incision site. °· You have more swelling or pain at the port site or the surrounding area. °· You have a fever that is not controlled with medicine. ° °This information is not intended to replace advice given to you by your health care provider. Make sure you discuss any questions you have with your health care provider. °Document  Released: 06/22/2005 Document Revised: 11/28/2015 Document Reviewed: 02/27/2013 °Elsevier Interactive Patient Education © 2017 [employer] °Moderate Conscious Sedation, Adult, Care After °These instructions provide you with information about caring for yourself after your procedure. Your health care provider may also give you more specific instructions. Your treatment has been planned according to current medical practices, but problems sometimes occur. Call your health care provider if you have any problems or questions after your procedure. °What can I expect after the procedure? °After your procedure, it is common: °· To feel sleepy for several hours. °· To feel clumsy and have poor balance for several hours. °· To have poor judgment for several hours. °· To vomit if you eat too soon. ° °Follow these instructions at home: °For at least 24 hours after the procedure: ° °· Do not: °? Participate in activities where you could fall or become injured. °? Drive. °? Use heavy machinery. °? Drink alcohol. °? Take sleeping pills or medicines that cause drowsiness. °? Make important decisions or sign legal documents. °? Take care of children on your own. °· Rest. °Eating and drinking °· Follow the diet recommended by your health care provider. °· If you vomit: °? Drink water, juice, or soup when you can drink without vomiting. °? Make sure you have little or no nausea before eating solid foods. °General instructions °· Have a responsible adult stay with you until you are awake and alert. °· Take over-the-counter and prescription medicines only as told by your health care provider. °· If you smoke, do not smoke without supervision. °· Keep all follow-up visits as told by your health care provider. This is important. °Contact a health care provider if: °· You keep feeling nauseous or you keep vomiting. °· You feel light-headed. °· You develop a rash. °· You have a fever. °Get help right away if: °· You have trouble  breathing. °This information is not intended to replace advice given to you by your health care provider. Make sure you discuss any questions you have with your health care provider. °Document Released: 04/12/2013 Document Revised: 11/25/2015 Document Reviewed: 10/12/2015 °Elsevier Interactive Patient Education © 2018 [employer] ° °

## 2017-03-09 NOTE — H&P (Signed)
Referring Physician(s): Feng,Yan  Supervising Physician: Sandi Mariscal  Patient Status: [facility]  Chief Complaint:  "I'm getting a port"  Subjective: Patient familiar to IR service from prior right  liver lesion biopsy on 01/01/17. She has a history of metastatic cholangiocarcinoma to the liver and presents again today for Port-A-Cath placement for palliative chemotherapy. Patient also has a history of bilateral lower extremity DVTs, on Coumadin, last dose on 03/04/17. She denies fever, headache, chest pain, dyspnea, cough, abdominal/back pain, nausea, vomiting or abnormal bleeding. Past Medical History:  Diagnosis Date  . History of blood clots   . Liver mass   . PONV (postoperative nausea and vomiting)    nausea   Past Surgical History:  Procedure Laterality Date  . ABDOMINAL HYSTERECTOMY     complete  . CESAREAN SECTION     x 1  . COLONOSCOPY WITH PROPOFOL N/A 12/24/2016   Procedure: COLONOSCOPY WITH PROPOFOL;  Surgeon: Milus Banister, MD;  Location: [facility];  Service: Endoscopy;  Laterality: N/A;  . ESOPHAGOGASTRODUODENOSCOPY (EGD) WITH PROPOFOL N/A 12/24/2016   Procedure: ESOPHAGOGASTRODUODENOSCOPY (EGD) WITH PROPOFOL;  Surgeon: Milus Banister, MD;  Location: [facility];  Service: Endoscopy;  Laterality: N/A;      Allergies: Dilaudid [hydromorphone hcl]; Sulfa antibiotics; and Tramadol  Medications: Prior to Admission medications   Medication Sig Start Date End Date Taking? Authorizing Provider  acetaminophen (TYLENOL) 500 MG tablet Take 1,000 mg by mouth every 6 (six) hours as needed (for pain.).    [provider]  escitalopram (LEXAPRO) 10 MG tablet Take 1 tablet (10 mg total) by mouth daily. 02/19/17   Truitt Merle, MD  lidocaine-prilocaine (EMLA) cream Apply 1 application topically as needed. 03/05/17   Alla Feeling, NP  ondansetron (ZOFRAN) 8 MG tablet Take 1 tablet (8 mg total) by mouth 2 (two) times daily as needed. Start on the third day after  chemotherapy. 02/02/17   Truitt Merle, MD  prochlorperazine (COMPAZINE) 10 MG tablet Take 1 tablet (10 mg total) by mouth every 6 (six) hours as needed (Nausea or vomiting). 02/02/17   Truitt Merle, MD  traMADol (ULTRAM) 50 MG tablet Take 1 tablet (50 mg total) by mouth every 6 (six) hours as needed. 01/12/17   Truitt Merle, MD  warfarin (COUMADIN) 4 MG tablet Take 4 mg by mouth daily at 6 PM.     [provider]     Vital Signs: BP 110/75 (BP Location: Right Arm)   Pulse 84   Temp 98.1 F (36.7 C) (Oral)   Resp 18   SpO2 100%   Physical Exam awake, alert. Chest clear to auscultation bilaterally. Heart with regular rate and rhythm. Abdomen soft, positive bowel sounds, nontender. No lower extremity edema.  Imaging: No results found.  Labs:  CBC:  Recent Labs  01/22/17 1050 02/12/17 0817 02/19/17 1036 03/05/17 0852  WBC 10.1 9.7 6.7 19.6*  HGB 13.6 14.2 13.0 12.8  HCT 41.3 42.3 39.2 38.2  PLT 186 173 136* 374    COAGS:  Recent Labs  01/01/17 1116  INR 1.06  APTT 34    BMP:  Recent Labs  12/28/16 1615 01/22/17 1050 02/12/17 0817 02/19/17 1036 03/05/17 0852  NA 139 141 139 137 138  K 3.9 4.9 3.8 4.3 3.9  CL 101  --   --   --   --   CO2 28 28 26 27 25   GLUCOSE 109* 90 132 82 93  BUN 13 16.2 14.8 14.9 14.3  CALCIUM 9.4 9.9 9.9 9.8 9.9  CREATININE 0.67 0.8 0.8 0.7 0.8  GFRNONAA >60  --   --   --   --   GFRAA >60  --   --   --   --     LIVER FUNCTION TESTS:  Recent Labs  01/22/17 1050 02/12/17 0817 02/19/17 1036 03/05/17 0852  BILITOT 0.88 0.97 0.71 0.47  AST 42* 51* 42* 28  ALT 35 46 64* 24  ALKPHOS 273* 307* 260* 257*  PROT 7.6 7.6 7.3 7.3  ALBUMIN 3.4* 3.2* 3.3* 3.5    Assessment and Plan: Pt with history of metastatic cholangiocarcinoma to the liver ; presents  today for Port-A-Cath placement for palliative chemotherapy. Patient also has a history of bilateral lower extremity DVTs, on Coumadin, last dose on 03/04/17.  Risks and benefits  discussed with the patient including, but not limited to bleeding, infection, pneumothorax, or fibrin sheath development and need for additional procedures.All of the patient's questions were answered, patient is agreeable to proceed.Consent signed and in chart. Labs pending.     Electronically Signed: D. Rowe Robert, PA-C 03/09/2017, 1:14 PM   I spent a total of 20 minutes  at the the patient's bedside AND on the patient's hospital floor or unit, greater than 50% of which was counseling/coordinating care for Port-A-Cath placement

## 2017-03-09 NOTE — Procedures (Signed)
Pre Procedure Dx: Cholangiocarcinoma Post Procedural Dx: Same  Successful placement of right IJ approach port-a-cath with tip at the superior caval atrial junction. The catheter is ready for immediate use.  Estimated Blood Loss: Minimal  Complications: None immediate.  Jay Cyra Spader, MD Pager #: [phone]   

## 2017-03-11 ENCOUNTER — Telehealth: Payer: Self-pay | Admitting: *Deleted

## 2017-03-11 NOTE — Telephone Encounter (Signed)
"  I had port-a-cath placed Tuesday.  I removed the dressing yesterday.  There's a small Band-aid there.  Do I take this off as well?  I need to apply cream Friday at 7:30 am for port use." This nurse asked if clear, plastic strips (Derma bond) visible to incisions.  Best protocol not to remove adhesive Band-Aid disrupting intact/seal to iincision.   "I told you I removed the dressing there's just a Band-Aid."  Nurse instructions provided with call do not remove Band-Aid, apply Emla cream to cover port-a-cath, cover with Saran wrap/Press & Seal to secure.  Nurse will remove everything in place to clean site before access.  Denies any further questions at this time.

## 2017-03-12 ENCOUNTER — Other Ambulatory Visit (HOSPITAL_BASED_OUTPATIENT_CLINIC_OR_DEPARTMENT_OTHER): Payer: BLUE CROSS/BLUE SHIELD

## 2017-03-12 ENCOUNTER — Ambulatory Visit (HOSPITAL_BASED_OUTPATIENT_CLINIC_OR_DEPARTMENT_OTHER): Payer: BLUE CROSS/BLUE SHIELD

## 2017-03-12 ENCOUNTER — Ambulatory Visit: Payer: BLUE CROSS/BLUE SHIELD

## 2017-03-12 ENCOUNTER — Other Ambulatory Visit: Payer: BLUE CROSS/BLUE SHIELD

## 2017-03-12 VITALS — BP 123/73 | HR 75 | Temp 98.6°F | Resp 18

## 2017-03-12 DIAGNOSIS — Z5111 Encounter for antineoplastic chemotherapy: Secondary | ICD-10-CM | POA: Diagnosis not present

## 2017-03-12 DIAGNOSIS — C221 Intrahepatic bile duct carcinoma: Secondary | ICD-10-CM

## 2017-03-12 DIAGNOSIS — C787 Secondary malignant neoplasm of liver and intrahepatic bile duct: Secondary | ICD-10-CM | POA: Diagnosis not present

## 2017-03-12 LAB — COMPREHENSIVE METABOLIC PANEL WITH GFR
ALT: 48 U/L (ref 0–55)
AST: 33 U/L (ref 5–34)
Albumin: 3.5 g/dL (ref 3.5–5.0)
Alkaline Phosphatase: 186 U/L — ABNORMAL HIGH (ref 40–150)
Anion Gap: 8 meq/L (ref 3–11)
BUN: 13.1 mg/dL (ref 7.0–26.0)
CO2: 26 meq/L (ref 22–29)
Calcium: 9.4 mg/dL (ref 8.4–10.4)
Chloride: 104 meq/L (ref 98–109)
Creatinine: 0.7 mg/dL (ref 0.6–1.1)
EGFR: >90 ml/min/1.73 m2
Glucose: 90 mg/dL (ref 70–140)
Potassium: 3.6 meq/L (ref 3.5–5.1)
Sodium: 137 meq/L (ref 136–145)
Total Bilirubin: 0.7 mg/dL (ref 0.20–1.20)
Total Protein: 6.9 g/dL (ref 6.4–8.3)

## 2017-03-12 LAB — CBC WITH DIFFERENTIAL/PLATELET
BASO%: 1.3 % (ref 0.0–2.0)
Basophils Absolute: 0.1 10e3/uL (ref 0.0–0.1)
EOS%: 0.3 % (ref 0.0–7.0)
Eosinophils Absolute: 0 10e3/uL (ref 0.0–0.5)
HCT: 33.6 % — ABNORMAL LOW (ref 34.8–46.6)
HGB: 11.5 g/dL — ABNORMAL LOW (ref 11.6–15.9)
LYMPH%: 19 % (ref 14.0–49.7)
MCH: 29.3 pg (ref 25.1–34.0)
MCHC: 34.1 g/dL (ref 31.5–36.0)
MCV: 85.8 fL (ref 79.5–101.0)
MONO#: 0.5 10e3/uL (ref 0.1–0.9)
MONO%: 6 % (ref 0.0–14.0)
NEUT#: 6 10e3/uL (ref 1.5–6.5)
NEUT%: 73.4 % (ref 38.4–76.8)
Platelets: 257 10e3/uL (ref 145–400)
RBC: 3.92 10e6/uL (ref 3.70–5.45)
RDW: 14.5 % (ref 11.2–14.5)
WBC: 8.2 10e3/uL (ref 3.9–10.3)
lymph#: 1.6 10e3/uL (ref 0.9–3.3)

## 2017-03-12 MED ORDER — PALONOSETRON HCL INJECTION 0.25 MG/5ML
INTRAVENOUS | Status: AC
  Filled 2017-03-12: qty 5

## 2017-03-12 MED ORDER — POTASSIUM CHLORIDE 2 MEQ/ML IV SOLN
Freq: Once | INTRAVENOUS | Status: AC
  Administered 2017-03-12: 10:00:00 via INTRAVENOUS
  Filled 2017-03-12: qty 10

## 2017-03-12 MED ORDER — SODIUM CHLORIDE 0.9 % IV SOLN
25.0000 mg/m2 | Freq: Once | INTRAVENOUS | Status: AC
  Administered 2017-03-12: 43 mg via INTRAVENOUS
  Filled 2017-03-12: qty 43

## 2017-03-12 MED ORDER — HEPARIN SOD (PORK) LOCK FLUSH 100 UNIT/ML IV SOLN
500.0000 [IU] | Freq: Once | INTRAVENOUS | Status: AC | PRN
  Administered 2017-03-12: 500 [IU]
  Filled 2017-03-12: qty 5

## 2017-03-12 MED ORDER — SODIUM CHLORIDE 0.9 % IV SOLN
1000.0000 mg/m2 | Freq: Once | INTRAVENOUS | Status: AC
  Administered 2017-03-12: 1710 mg via INTRAVENOUS
  Filled 2017-03-12: qty 44.97

## 2017-03-12 MED ORDER — SODIUM CHLORIDE 0.9% FLUSH
10.0000 mL | INTRAVENOUS | Status: DC | PRN
  Administered 2017-03-12 (×2): 10 mL
  Filled 2017-03-12: qty 10

## 2017-03-12 MED ORDER — SODIUM CHLORIDE 0.9 % IV SOLN
Freq: Once | INTRAVENOUS | Status: AC
  Administered 2017-03-12: 12:00:00 via INTRAVENOUS

## 2017-03-12 MED ORDER — PALONOSETRON HCL INJECTION 0.25 MG/5ML
0.2500 mg | Freq: Once | INTRAVENOUS | Status: AC
Start: 2017-03-12 — End: 2017-03-12
  Administered 2017-03-12: 0.25 mg via INTRAVENOUS

## 2017-03-12 MED ORDER — FOSAPREPITANT DIMEGLUMINE INJECTION 150 MG
Freq: Once | INTRAVENOUS | Status: AC
  Administered 2017-03-12: 12:00:00 via INTRAVENOUS
  Filled 2017-03-12: qty 5

## 2017-03-12 NOTE — Patient Instructions (Signed)
[place] Discharge Instructions for Patients Receiving Chemotherapy  Today you received the following chemotherapy agents:  Gemzar, Cisplatin To help prevent nausea and vomiting after your treatment, we encourage you to take your nausea medication.    If you develop nausea and vomiting that is not controlled by your nausea medication, call the clinic.   BELOW ARE SYMPTOMS THAT SHOULD BE REPORTED IMMEDIATELY:  *FEVER GREATER THAN 100.5 F  *CHILLS WITH OR WITHOUT FEVER  NAUSEA AND VOMITING THAT IS NOT CONTROLLED WITH YOUR NAUSEA MEDICATION  *UNUSUAL SHORTNESS OF BREATH  *UNUSUAL BRUISING OR BLEEDING  TENDERNESS IN MOUTH AND THROAT WITH OR WITHOUT PRESENCE OF ULCERS  *URINARY PROBLEMS  *BOWEL PROBLEMS  UNUSUAL RASH Items with * indicate a potential emergency and should be followed up as soon as possible.  Feel free to call the clinic you have any questions or concerns. The clinic phone number is [phone].  Please show the [place] at check-in to the Emergency Department and triage nurse.

## 2017-03-12 NOTE — Patient Instructions (Signed)
[place] Discharge Instructions for Patients Receiving Chemotherapy  Today you received the following chemotherapy agents:  Gemzar, Cisplatin To help prevent nausea and vomiting after your treatment, we encourage you to take your nausea medication.    If you develop nausea and vomiting that is not controlled by your nausea medication, call the clinic.   BELOW ARE SYMPTOMS THAT SHOULD BE REPORTED IMMEDIATELY:  *FEVER GREATER THAN 100.5 F  *CHILLS WITH OR WITHOUT FEVER  NAUSEA AND VOMITING THAT IS NOT CONTROLLED WITH YOUR NAUSEA MEDICATION  *UNUSUAL SHORTNESS OF BREATH  *UNUSUAL BRUISING OR BLEEDING  TENDERNESS IN MOUTH AND THROAT WITH OR WITHOUT PRESENCE OF ULCERS  *URINARY PROBLEMS  *BOWEL PROBLEMS  UNUSUAL RASH Items with * indicate a potential emergency and should be followed up as soon as possible.  Feel free to call the clinic you have any questions or concerns. The clinic phone number is [phone].  Please show the [place] at check-in to the Emergency Department and triage nurse.

## 2017-03-13 LAB — CANCER ANTIGEN 19-9: CAN 19-9: 42 U/mL — AB (ref 0–35)

## 2017-03-16 ENCOUNTER — Telehealth: Payer: Self-pay | Admitting: *Deleted

## 2017-03-16 DIAGNOSIS — Z7901 Long term (current) use of anticoagulants: Secondary | ICD-10-CM | POA: Diagnosis not present

## 2017-03-16 DIAGNOSIS — Z86718 Personal history of other venous thrombosis and embolism: Secondary | ICD-10-CM | POA: Diagnosis not present

## 2017-03-16 NOTE — Telephone Encounter (Signed)
Call from pt stating she thought the MD was going to "let me know how the chemo is going." Pt denies any reportable side effects/ symptoms. Informed her she is scheduled to see MD on 9/21, MD will review things then and discuss plan going forward.  Pt voiced understanding. Encouraged her to call office if she has any questions or issues prior to next visit. She voiced understanding.

## 2017-03-22 NOTE — Progress Notes (Signed)
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 Widespread hepatic metastasis, without abdominal primary identified. 2. Right twelfth rib metastasis. 3. Soft tissue fullness within the cecum, not entirely imaged. This could represent prominent stool or less likely a colon primary. Consider dedicated chest and pelvic CT or PET to evaluate for primary. 4. Suspicious right cardiophrenic angle adenopathy. Indeterminate/equivocal portal caval node. 5. Left adrenal adenoma. These results will be called to the ordering clinician or representative by the Radiologist Assistant, and communication documented in the PACS or zVision Dashboard.      12/24/2016 Procedure    Upper Endoscopy Nodular distal gastritis, biospied.      12/24/2016 Procedure    Colonoscopy  - The entire examined colon is normal on direct and retroflexion views. - No polyps or cancers.      12/24/2016 Pathology Results     Diagnosis Stomach, biopsy, distal - REACTIVE GASTROPATHY - NO MALIGNANCY IDENTIFIED      12/29/2016 Imaging    CT CAP w contrast  IMPRESSION: 1. No definite CT findings to suggest primary source of the apparent hepatic metastases. Gallbladder is  contracted and there is some wall irregularity in the body of the gallbladder but no gross gallbladder mass lesion is evident. No biliary dilatation. Previous MRI raise the question of the cecal lesion, but there is no evidence for gross cecal mass by CT today. There is some minimal fullness in the distal stomach, but this is a fairly typical appearance on CT and given the smooth appearance of the distal gastric wall and symmetric tissue fullness, neoplasm is felt to be not likely. 2. Hepatic metastases as seen on recent abdominal MRI. 3. Borderline to mild gastrohepatic, hepatoduodenal ligament, and retroperitoneal lymphadenopathy.  4. PET-CT may prove helpful to further evaluate.      01/01/2017 Imaging    US Biopsy  IMPRESSION: Status post ultrasound-guided biopsy of right liver tumor. Tissue specimen sent to pathology for complete histopathologic analysis.      01/01/2017 Procedure    Ultrasound-guided liver biopsy      01/01/2017 Pathology Results    Liver biopsy showed adenocarcinoma, consistent with cholangiocarcinoma.       01/01/2017 Initial Diagnosis    Cholangiocarcinoma metastatic to liver ([place])      01/27/2017 Imaging    NM PET scan IMPRESSION: Dominant geographic mass in the right hepatic lobe is heterogeneous but hypermetabolic with maximum SUV of 7.5. Likely metastatic hypermetabolic foci are present primarily in the right hepatic lobe but also involving the medial and lateral segments of the left hepatic lobe. Small lesion medially in the right twelfth rib subcostal space with very low metabolic activity with maximum SUV of 1.9, previously seen on the prior MRI of 12/17/2016. I am skeptical that this is a metastatic lesion. This  could well be a schwannoma or small vascular malformation. This does appear increased in size compared to 6/20 12/2012, and merits surveillance. Mild splenomegaly but without focal splenic hypermetabolic activity. 2.0 by 1.6 cm  hypodense right thyroid mass is mildly hypermetabolic. A significant minority of such lesions can turn out to represent thyroid cancer. Thyroid sonography is recommended for further characterization. Other imaging findings of potential clinical significance: Suspected cholelithiasis. Aortic Atherosclerosis (ICD10-I70.0).      02/12/2017 -  Chemotherapy    first line chemo cisplatin and gemcitabine. chemotherapy q week x 2 weeks with 1 week off starting 02/12/17      HISTORY OF PRESENTING ILLNESS (01/12/17):  [name] 61 y.o. female is here because of newly diagnosed Cholangiocarcinoma. She was referred by her gastroenterologist Dr. Ardis Hughs. She presents to my clinic with her husband today.  She has had vague, intermittent abdominal pain, and a total of 10 pound weight loss since December. She was originally everted by her primary care physician, and lab test showed elevated liver function tests. She underwent abdominal MRI with and without contrast on 12/17/2016, which showed wide spread hepatic metastasis, without abdominal primary identified. Soft tissue fullness within the cecum, indeterminate. She was referred to Dr. Ardis Hughs, and had negative colonoscopy and endoscopy on 12/24/2016. She underwent ultrasound guided liver biopsy on 01/01/2017, pathology showed adenocarcinoma, consistent with glandular carcinoma.   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   She reports some pain that started worsened days after her biopsy. She has nausea commonly. She is eating well. She reports fatigue and constipation and she takes [place] to help. Her bowel movements are about every 3 days. She denies chest pain, cough or SOB. She does mammograms normally with her most recent one being in June 2017.  She denies any history of cancer.   CURRENT THERAPY: first line chemo cisplatin and gemcitabine. chemotherapy q week x 2 weeks with 1 week off starting 02/12/17  INTERVAL HISTORY:   ARLAYNE LIGGINS returns to the clinic today for follow up and following cycle 3 cisplatin and gemcitabine. She is with her daughter, [name]. She did not need nausea medication this last cycle of chemo. She is eating well and gained 2-3 pounds.  She will be going to beach and [place] for a week and will be back on Oct 11th.   Overall she is doing well.  She is taking her antidepressant medication, lexapro is doing well.   MEDICAL HISTORY:  Past Medical History:  Diagnosis Date  . History of blood clots   . Liver mass   . PONV (postoperative nausea and vomiting)    nausea   SURGICAL HISTORY: Past Surgical History:  Procedure Laterality Date  . ABDOMINAL HYSTERECTOMY     complete  . CESAREAN SECTION     x 1  . COLONOSCOPY WITH PROPOFOL N/A 12/24/2016   Procedure: COLONOSCOPY WITH PROPOFOL;  Surgeon: Milus Banister, MD;  Location: [facility];  Service: Endoscopy;  Laterality: N/A;  . ESOPHAGOGASTRODUODENOSCOPY (EGD) WITH PROPOFOL N/A 12/24/2016   Procedure: ESOPHAGOGASTRODUODENOSCOPY (EGD) WITH PROPOFOL;  Surgeon: Milus Banister, MD;  Location: [facility];  Service: Endoscopy;  Laterality: N/A;  . IR FLUORO GUIDE PORT INSERTION RIGHT  03/09/2017  . IR US GUIDE VASC ACCESS RIGHT  03/09/2017   SOCIAL HISTORY: Social History   Social History  . Marital status: Married    Spouse name: N/A  . Number of children: 1  . Years of education:  N/A   Occupational History  . Not on file.   Social History Main Topics  . Smoking status: Former Smoker    Packs/day: 1.00    Years: 15.00    Types: Cigarettes  . Smokeless tobacco: Never Used     Comment: quit 2010  . Alcohol use No  . Drug use: No  . Sexual activity: Not on file   Other Topics Concern  . Not on file   Social History Narrative  . No narrative on file   FAMILY HISTORY: Family History  Problem Relation Age of Onset  . Stomach cancer Mother   . Emphysema Father    ALLERGIES:  is allergic to dilaudid [hydromorphone hcl];  sulfa antibiotics; and tramadol.  MEDICATIONS:  Current Outpatient Prescriptions  Medication Sig Dispense Refill  . acetaminophen (TYLENOL) 500 MG tablet Take 1,000 mg by mouth every 6 (six) hours as needed (for pain.).    Marland Kitchen escitalopram (LEXAPRO) 10 MG tablet Take 1 tablet (10 mg total) by mouth daily. 30 tablet 2  . lidocaine-prilocaine (EMLA) cream Apply 1 application topically as needed. 30 g 2  . prochlorperazine (COMPAZINE) 10 MG tablet Take 1 tablet (10 mg total) by mouth every 6 (six) hours as needed (Nausea or vomiting). 30 tablet 1  . warfarin (COUMADIN) 4 MG tablet Take 4 mg by mouth daily at 6 PM.     . ondansetron (ZOFRAN) 8 MG tablet Take 1 tablet (8 mg total) by mouth 2 (two) times daily as needed. Start on the third day after chemotherapy. (Patient not taking: Reported on 03/26/2017) 30 tablet 1  . traMADol (ULTRAM) 50 MG tablet Take 1 tablet (50 mg total) by mouth every 6 (six) hours as needed. (Patient not taking: Reported on 03/26/2017) 30 tablet 1   No current facility-administered medications for this visit.     REVIEW OF SYSTEMS:  Constitutional: Denies fevers, chills or abnormal night sweats (+) fatigue Eyes: Denies blurriness of vision, double vision or watery eyes Ears, nose, mouth, throat, and face: Denies mucositis or sore throat Respiratory: Denies cough, dyspnea or wheezes Cardiovascular: Denies palpitation, chest discomfort or lower extremity swelling Gastrointestinal:  Denies heartburn or change in bowel habits, (+) nausea, controlled Skin: Denies abnormal skin rashes Lymphatics: Denies new lymphadenopathy or easy bruising Neurological:Denies numbness, tingling or new weaknesses Behavioral/Psych: (+) depression, controlled MSK: Denies arthralgias.  All other systems were reviewed with the patient and are negative.  PHYSICAL EXAMINATION:  ECOG PERFORMANCE STATUS: 1 - Symptomatic but completely ambulatory Vitals:   03/26/17 0918  BP: (!) 127/54  Pulse: 75   Resp: 18  Temp: 97.7 F (36.5 C)  TempSrc: Oral  SpO2: 100%  Weight: 133 lb 4.8 oz (60.5 kg)  Height: 5' 8.5" (1.74 m)    GENERAL:alert, no distress and comfortable SKIN: skin color, texture, turgor are normal, no rashes or significant lesions EYES: normal, conjunctiva are pink and non-injected, sclera clear OROPHARYNX:no exudate, no erythema and lips, buccal mucosa, and tongue normal  NECK: supple, thyroid normal size, non-tender, without nodularity LYMPH:  no palpable lymphadenopathy in the cervical, axillary or inguinal LUNGS: clear to auscultation and percussion with normal breathing effort HEART: regular rate & rhythm and no murmurs and no lower extremity edema ABDOMEN:abdomen soft, non-tender and normal bowel sounds. Enlarged liver to 4 cm and tender below the ribcage. Musculoskeletal:no cyanosis of digits and no clubbing  PSYCH: alert & oriented x 3 with fluent speech NEURO: no focal motor/sensory deficits  LABORATORY DATA:  I have  reviewed the data as listed CBC Latest Ref Rng & Units 03/26/2017 03/12/2017 03/09/2017  WBC 3.9 - 10.3 10e3/uL 7.6 8.2 9.9  Hemoglobin 11.6 - 15.9 g/dL 11.4(L) 11.5(L) 11.1(L)  Hematocrit 34.8 - 46.6 % 33.8(L) 33.6(L) 32.9(L)  Platelets 145 - 400 10e3/uL 281 257 375   CMP Latest Ref Rng & Units 03/26/2017 03/12/2017 03/09/2017  Glucose 70 - 140 mg/dl 83 90 93  BUN 7.0 - 26.0 mg/dL 18.4 13.1 17  Creatinine 0.6 - 1.1 mg/dL 0.7 0.7 0.56  Sodium 136 - 145 mEq/L 140 137 136  Potassium 3.5 - 5.1 mEq/L 4.3 3.6 3.9  Chloride 101 - 111 mmol/L - - 102  CO2 22 - 29 mEq/L '25 26 26  '$ Calcium 8.4 - 10.4 mg/dL 9.3 9.4 9.1  Total Protein 6.4 - 8.3 g/dL 7.0 6.9 -  Total Bilirubin 0.20 - 1.20 mg/dL 0.42 0.70 -  Alkaline Phos 40 - 150 U/L 155(H) 186(H) -  AST 5 - 34 U/L 27 33 -  ALT 0 - 55 U/L 24 48 -    CA 19.9:  01/22/17:  44. 02/12/17: 46 02/19/17: 44 03/05/17: 42 03/12/17: 42 03/26/17: PENDING    PATHOLOGY: Diagnosis 12/24/2016 Stomach, biopsy, distal -  REACTIVE GASTROPATHY - NO MALIGNANCY IDENTIFIED  Diagnosis 01/01/2017 Liver, needle/core biopsy ADENOCARCINOMA, CONSISTENT WITH CHOLANGIOCARCINOMA.  RADIOGRAPHIC STUDIES: I have personally reviewed the radiological images as listed and agreed with the findings in the report. Ir US Guide Vasc Access Right  Result Date: 03/09/2017 INDICATION: History of cholangiocarcinoma. In need of durable intravenous access for chemotherapy administration. EXAM: IMPLANTED PORT A CATH PLACEMENT WITH ULTRASOUND AND FLUOROSCOPIC GUIDANCE COMPARISON:  PET-CT - 01/27/2017 MEDICATIONS: Ancef 2 gm IV; The antibiotic was administered within an appropriate time interval prior to skin puncture. ANESTHESIA/SEDATION: Moderate (conscious) sedation was employed during this procedure. A total of Versed 2 mg and Fentanyl 100 mcg was administered intravenously. Moderate Sedation Time: 24 minutes. The patient's level of consciousness and vital signs were monitored continuously by radiology nursing throughout the procedure under my direct supervision. CONTRAST:  None FLUOROSCOPY TIME:  18 seconds (2 mGy) COMPLICATIONS: None immediate. PROCEDURE: The procedure, risks, benefits, and alternatives were explained to the patient. Questions regarding the procedure were encouraged and answered. The patient understands and consents to the procedure. The right neck and chest were prepped with chlorhexidine in a sterile fashion, and a sterile drape was applied covering the operative field. Maximum barrier sterile technique with sterile gowns and gloves were used for the procedure. A timeout was performed prior to the initiation of the procedure. Local anesthesia was provided with 1% lidocaine with epinephrine. After creating a small venotomy incision, a micropuncture kit was utilized to access the internal jugular vein. Real-time ultrasound guidance was utilized for vascular access including the acquisition of a permanent ultrasound image documenting  patency of the accessed vessel. The microwire was utilized to measure appropriate catheter length. A subcutaneous port pocket was then created along the upper chest wall utilizing a combination of sharp and blunt dissection. The pocket was irrigated with sterile saline. A single lumen thin power injectable port was chosen for placement. The 8 Fr catheter was tunneled from the port pocket site to the venotomy incision. The port was placed in the pocket. The external catheter was trimmed to appropriate length. At the venotomy, an 8 Fr peel-away sheath was placed over a guidewire under fluoroscopic guidance. The catheter was then placed through the sheath and the sheath was removed. Final catheter positioning was  confirmed and documented with a fluoroscopic spot radiograph. The port was accessed with a Huber needle, aspirated and flushed with heparinized saline. The venotomy site was closed with an interrupted 4-0 Vicryl suture. The port pocket incision was closed with interrupted 2-0 Vicryl suture and the skin was opposed with a running subcuticular 4-0 Vicryl suture. Dermabond and Steri-strips were applied to both incisions. Dressings were placed. The patient tolerated the procedure well without immediate post procedural complication. FINDINGS: After catheter placement, the tip lies within the superior cavoatrial junction. The catheter aspirates and flushes normally and is ready for immediate use. IMPRESSION: Successful placement of a right internal jugular approach power injectable Port-A-Cath. The catheter is ready for immediate use. Electronically Signed   By: Sandi Mariscal M.D.   On: 03/09/2017 16:52   Ir Fluoro Guide Port Insertion Right  Result Date: 03/09/2017 INDICATION: History of cholangiocarcinoma. In need of durable intravenous access for chemotherapy administration. EXAM: IMPLANTED PORT A CATH PLACEMENT WITH ULTRASOUND AND FLUOROSCOPIC GUIDANCE COMPARISON:  PET-CT - 01/27/2017 MEDICATIONS: Ancef 2 gm  IV; The antibiotic was administered within an appropriate time interval prior to skin puncture. ANESTHESIA/SEDATION: Moderate (conscious) sedation was employed during this procedure. A total of Versed 2 mg and Fentanyl 100 mcg was administered intravenously. Moderate Sedation Time: 24 minutes. The patient's level of consciousness and vital signs were monitored continuously by radiology nursing throughout the procedure under my direct supervision. CONTRAST:  None FLUOROSCOPY TIME:  18 seconds (2 mGy) COMPLICATIONS: None immediate. PROCEDURE: The procedure, risks, benefits, and alternatives were explained to the patient. Questions regarding the procedure were encouraged and answered. The patient understands and consents to the procedure. The right neck and chest were prepped with chlorhexidine in a sterile fashion, and a sterile drape was applied covering the operative field. Maximum barrier sterile technique with sterile gowns and gloves were used for the procedure. A timeout was performed prior to the initiation of the procedure. Local anesthesia was provided with 1% lidocaine with epinephrine. After creating a small venotomy incision, a micropuncture kit was utilized to access the internal jugular vein. Real-time ultrasound guidance was utilized for vascular access including the acquisition of a permanent ultrasound image documenting patency of the accessed vessel. The microwire was utilized to measure appropriate catheter length. A subcutaneous port pocket was then created along the upper chest wall utilizing a combination of sharp and blunt dissection. The pocket was irrigated with sterile saline. A single lumen thin power injectable port was chosen for placement. The 8 Fr catheter was tunneled from the port pocket site to the venotomy incision. The port was placed in the pocket. The external catheter was trimmed to appropriate length. At the venotomy, an 8 Fr peel-away sheath was placed over a guidewire under  fluoroscopic guidance. The catheter was then placed through the sheath and the sheath was removed. Final catheter positioning was confirmed and documented with a fluoroscopic spot radiograph. The port was accessed with a Huber needle, aspirated and flushed with heparinized saline. The venotomy site was closed with an interrupted 4-0 Vicryl suture. The port pocket incision was closed with interrupted 2-0 Vicryl suture and the skin was opposed with a running subcuticular 4-0 Vicryl suture. Dermabond and Steri-strips were applied to both incisions. Dressings were placed. The patient tolerated the procedure well without immediate post procedural complication. FINDINGS: After catheter placement, the tip lies within the superior cavoatrial junction. The catheter aspirates and flushes normally and is ready for immediate use. IMPRESSION: Successful placement of a right internal  jugular approach power injectable Port-A-Cath. The catheter is ready for immediate use. Electronically Signed   By: Simonne Come M.D.   On: 03/09/2017 16:52   ASSESSMENT & PLAN: ADEA GEISEL is a  62 y.o. female, with past medical history of DVT on Coumadin, presented with intermittent abdominal pain, and 10 pound weight loss.  1. 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 -Her liver biopsy confirmed adenocarcinoma, consistent with cholangiocarcinoma. Her EGD and endoscopy was negative for primary tumor. CT chest, abdomen and pelvis was negative for other primary or metastasis. -CA19.9 was elevated at 44 -Her case was previously reviewed in our GI tumor Board, the T12 metastasis reported by MRI was not highly suspicious for bone metastasis, after we reviewed in the tumor board. -I reviewed her PET scan images with patient in person, unfortunately he has extensive liver metastasis, including metastatic  lesion in the left lobe, no other distant metastasis. -I have discussed with Surgeon Dr. Donell Beers who doesn't think that surgery resection is feasible due to left lobe liver metastasis.  -I recommend her to start his systemic chemotherapy to control her cancer. The goal of therapy is palliative, to prolong her life and reserve quality of life. 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  -she has started chemo, tolerated first dose well except moderate to severe fatigue, which partially could be related to her depression also  -Labs reviewed and overall within normal limits to continue with treatment. She is clinically tolerating chemo well.  -She will have repeat CT in 5-6 weeks -f/u with NP Lacie in 3 weeks and me in 6 weeks    2. Constipation - I encouraged her to drink more water or try prune juice, uses stool softener and laxative as needed.  3. Right flank pain - She has been trying Tylenol once daily - I will give her a prescription of Tramadol. She should try to eat before taking to avoid nausea.  4. Nausea - I will prescribe compazine for nausea.  - Patient still has a good appetite.  -Nausea is mild and manageable   5. Depression  -Was previously on Zoloft, stopped this after one month d/t it not helping her.  -I started her on 10mg  Lexapro daily  -Pt reports to doing much better on Lexapro, will continue   6. Mild anemia -Secondary to chemotherapy, overall very mild, stable, continue observation -Consider blood transfusion if hemoglobin less than 8  7. Goal of care discussion  -We again discussed the incurable nature of her cancer, and the overall poor prognosis, especially if she does not have good response to chemotherapy or progress on chemo -The patient understands the goal of care is palliative. -I recommend DNR/DNI,  she will think about it    PLAN -Labs reviewed; stable. Continue with C3D1 chemo today D8 chemo next week.  -Lab, flush, chemo cisplatin and gemcitabine in 3, 4, 6 and 7 weeks -F/u with lacie in 3 weeks and me in 6 weeks -CT CAP with contrast in 5-6 weeks for restaging    Orders Placed This Encounter  Procedures  . CT Abdomen Pelvis W Contrast    Standing Status:   Future    Standing Expiration Date:   03/26/2018    Order Specific Question:  If indicated for the ordered procedure, I authorize the administration of contrast media per Radiology protocol    Answer:   Yes    Order Specific Question:   Preferred imaging location?    Answer:   [facility]    Order Specific Question:   Radiology Contrast Protocol - do NOT remove file path    Answer:   \\[email or web address]  . CT Chest W Contrast    Standing Status:   Future    Standing Expiration Date:   03/26/2018    Order Specific Question:   If indicated for the ordered procedure, I authorize the administration of contrast media per Radiology protocol    Answer:   Yes    Order Specific Question:   Preferred imaging location?    Answer:   [facility]    Order Specific Question:   Radiology Contrast Protocol - do NOT remove file path    Answer:   \\[email or web address]   All questions were answered. The patient knows to call the clinic with any problems, questions or concerns. I spent 20 minutes counseling the patient face to face. The total time spent in the appointment was 25 minutes and more than 50% was on counseling.  This document serves as a record of services personally performed by Truitt Merle, MD. It was created on her behalf by Joslyn Devon, a trained medical scribe. The creation of this record is based on the scribe's personal observations and the provider's statements to them. This document has been checked and approved by the attending provider.     Truitt Merle,  MD 03/26/2017

## 2017-03-25 DIAGNOSIS — Z7901 Long term (current) use of anticoagulants: Secondary | ICD-10-CM | POA: Diagnosis not present

## 2017-03-25 DIAGNOSIS — Z86718 Personal history of other venous thrombosis and embolism: Secondary | ICD-10-CM | POA: Diagnosis not present

## 2017-03-26 ENCOUNTER — Ambulatory Visit: Payer: BLUE CROSS/BLUE SHIELD

## 2017-03-26 ENCOUNTER — Other Ambulatory Visit (HOSPITAL_BASED_OUTPATIENT_CLINIC_OR_DEPARTMENT_OTHER): Payer: BLUE CROSS/BLUE SHIELD

## 2017-03-26 ENCOUNTER — Encounter: Payer: Self-pay | Admitting: Hematology

## 2017-03-26 ENCOUNTER — Telehealth: Payer: Self-pay | Admitting: Hematology

## 2017-03-26 ENCOUNTER — Ambulatory Visit (HOSPITAL_BASED_OUTPATIENT_CLINIC_OR_DEPARTMENT_OTHER): Payer: BLUE CROSS/BLUE SHIELD

## 2017-03-26 ENCOUNTER — Ambulatory Visit (HOSPITAL_BASED_OUTPATIENT_CLINIC_OR_DEPARTMENT_OTHER): Payer: BLUE CROSS/BLUE SHIELD | Admitting: Hematology

## 2017-03-26 VITALS — BP 127/54 | HR 75 | Temp 97.7°F | Resp 18 | Ht 68.5 in | Wt 133.3 lb

## 2017-03-26 DIAGNOSIS — C787 Secondary malignant neoplasm of liver and intrahepatic bile duct: Secondary | ICD-10-CM

## 2017-03-26 DIAGNOSIS — K5900 Constipation, unspecified: Secondary | ICD-10-CM

## 2017-03-26 DIAGNOSIS — Z7189 Other specified counseling: Secondary | ICD-10-CM | POA: Insufficient documentation

## 2017-03-26 DIAGNOSIS — R109 Unspecified abdominal pain: Secondary | ICD-10-CM | POA: Diagnosis not present

## 2017-03-26 DIAGNOSIS — C221 Intrahepatic bile duct carcinoma: Secondary | ICD-10-CM

## 2017-03-26 DIAGNOSIS — Z5111 Encounter for antineoplastic chemotherapy: Secondary | ICD-10-CM | POA: Diagnosis not present

## 2017-03-26 DIAGNOSIS — D6481 Anemia due to antineoplastic chemotherapy: Secondary | ICD-10-CM | POA: Diagnosis not present

## 2017-03-26 DIAGNOSIS — Z23 Encounter for immunization: Secondary | ICD-10-CM

## 2017-03-26 DIAGNOSIS — R110 Nausea: Secondary | ICD-10-CM

## 2017-03-26 DIAGNOSIS — Z95828 Presence of other vascular implants and grafts: Secondary | ICD-10-CM

## 2017-03-26 DIAGNOSIS — F329 Major depressive disorder, single episode, unspecified: Secondary | ICD-10-CM | POA: Diagnosis not present

## 2017-03-26 LAB — CBC WITH DIFFERENTIAL/PLATELET
BASO%: 1.4 % (ref 0.0–2.0)
BASOS ABS: 0.1 10*3/uL (ref 0.0–0.1)
EOS ABS: 0.1 10*3/uL (ref 0.0–0.5)
EOS%: 1.9 % (ref 0.0–7.0)
HEMATOCRIT: 33.8 % — AB (ref 34.8–46.6)
HEMOGLOBIN: 11.4 g/dL — AB (ref 11.6–15.9)
LYMPH#: 1.6 10*3/uL (ref 0.9–3.3)
LYMPH%: 21.4 % (ref 14.0–49.7)
MCH: 29.7 pg (ref 25.1–34.0)
MCHC: 33.7 g/dL (ref 31.5–36.0)
MCV: 88.3 fL (ref 79.5–101.0)
MONO#: 0.6 10*3/uL (ref 0.1–0.9)
MONO%: 7.3 % (ref 0.0–14.0)
NEUT%: 68 % (ref 38.4–76.8)
NEUTROS ABS: 5.2 10*3/uL (ref 1.5–6.5)
Platelets: 281 10*3/uL (ref 145–400)
RBC: 3.83 10*6/uL (ref 3.70–5.45)
RDW: 17.6 % — ABNORMAL HIGH (ref 11.2–14.5)
WBC: 7.6 10*3/uL (ref 3.9–10.3)

## 2017-03-26 LAB — COMPREHENSIVE METABOLIC PANEL
ALBUMIN: 3.5 g/dL (ref 3.5–5.0)
ALT: 24 U/L (ref 0–55)
ANION GAP: 8 meq/L (ref 3–11)
AST: 27 U/L (ref 5–34)
Alkaline Phosphatase: 155 U/L — ABNORMAL HIGH (ref 40–150)
BUN: 18.4 mg/dL (ref 7.0–26.0)
CHLORIDE: 107 meq/L (ref 98–109)
CO2: 25 meq/L (ref 22–29)
CREATININE: 0.7 mg/dL (ref 0.6–1.1)
Calcium: 9.3 mg/dL (ref 8.4–10.4)
EGFR: >90 mL/min/{1.73_m2} (ref >90)
GLUCOSE: 83 mg/dL (ref 70–140)
Potassium: 4.3 mEq/L (ref 3.5–5.1)
Sodium: 140 mEq/L (ref 136–145)
Total Bilirubin: 0.42 mg/dL (ref 0.20–1.20)
Total Protein: 7 g/dL (ref 6.4–8.3)

## 2017-03-26 MED ORDER — INFLUENZA VAC SPLIT QUAD 0.5 ML IM SUSY
0.5000 mL | PREFILLED_SYRINGE | Freq: Once | INTRAMUSCULAR | Status: AC
  Administered 2017-03-26: 0.5 mL via INTRAMUSCULAR
  Filled 2017-03-26: qty 0.5

## 2017-03-26 MED ORDER — SODIUM CHLORIDE 0.9% FLUSH
10.0000 mL | INTRAVENOUS | Status: DC | PRN
  Administered 2017-03-26: 10 mL
  Filled 2017-03-26: qty 10

## 2017-03-26 MED ORDER — POTASSIUM CHLORIDE 2 MEQ/ML IV SOLN
Freq: Once | INTRAVENOUS | Status: AC
  Administered 2017-03-26: 11:00:00 via INTRAVENOUS
  Filled 2017-03-26: qty 10

## 2017-03-26 MED ORDER — SODIUM CHLORIDE 0.9 % IV SOLN
1000.0000 mg/m2 | Freq: Once | INTRAVENOUS | Status: AC
  Administered 2017-03-26: 1710 mg via INTRAVENOUS
  Filled 2017-03-26: qty 44.97

## 2017-03-26 MED ORDER — SODIUM CHLORIDE 0.9 % IV SOLN
Freq: Once | INTRAVENOUS | Status: AC
  Administered 2017-03-26: 13:00:00 via INTRAVENOUS
  Filled 2017-03-26: qty 5

## 2017-03-26 MED ORDER — HEPARIN SOD (PORK) LOCK FLUSH 100 UNIT/ML IV SOLN
500.0000 [IU] | Freq: Once | INTRAVENOUS | Status: AC | PRN
  Administered 2017-03-26: 500 [IU]
  Filled 2017-03-26: qty 5

## 2017-03-26 MED ORDER — SODIUM CHLORIDE 0.9% FLUSH
10.0000 mL | INTRAVENOUS | Status: DC | PRN
  Administered 2017-03-26: 10 mL via INTRAVENOUS
  Filled 2017-03-26: qty 10

## 2017-03-26 MED ORDER — PALONOSETRON HCL INJECTION 0.25 MG/5ML
0.2500 mg | Freq: Once | INTRAVENOUS | Status: AC
  Administered 2017-03-26: 0.25 mg via INTRAVENOUS

## 2017-03-26 MED ORDER — SODIUM CHLORIDE 0.9 % IV SOLN
25.0000 mg/m2 | Freq: Once | INTRAVENOUS | Status: AC
  Administered 2017-03-26: 43 mg via INTRAVENOUS
  Filled 2017-03-26: qty 43

## 2017-03-26 MED ORDER — PALONOSETRON HCL INJECTION 0.25 MG/5ML
INTRAVENOUS | Status: AC
  Filled 2017-03-26: qty 5

## 2017-03-26 MED ORDER — SODIUM CHLORIDE 0.9 % IV SOLN
Freq: Once | INTRAVENOUS | Status: AC
  Administered 2017-03-26: 10:00:00 via INTRAVENOUS

## 2017-03-26 NOTE — Progress Notes (Signed)
Pt is approved for the $400 [facility] grant.  °

## 2017-03-26 NOTE — Telephone Encounter (Signed)
Scheduled appt per 9/21 los - Gave patient AVS and calender per los.  

## 2017-03-26 NOTE — Patient Instructions (Signed)
[facility] Discharge Instructions for Patients Receiving Chemotherapy  Today you received the following chemotherapy agents:  Gemzar, Cisplatin To help prevent nausea and vomiting after your treatment, we encourage you to take your nausea medication.    If you develop nausea and vomiting that is not controlled by your nausea medication, call the clinic.   BELOW ARE SYMPTOMS THAT SHOULD BE REPORTED IMMEDIATELY:  *FEVER GREATER THAN 100.5 F  *CHILLS WITH OR WITHOUT FEVER  NAUSEA AND VOMITING THAT IS NOT CONTROLLED WITH YOUR NAUSEA MEDICATION  *UNUSUAL SHORTNESS OF BREATH  *UNUSUAL BRUISING OR BLEEDING  TENDERNESS IN MOUTH AND THROAT WITH OR WITHOUT PRESENCE OF ULCERS  *URINARY PROBLEMS  *BOWEL PROBLEMS  UNUSUAL RASH Items with * indicate a potential emergency and should be followed up as soon as possible.  Feel free to call the clinic you have any questions or concerns. The clinic phone number is [phone].  Please show the [place] at check-in to the Emergency Department and triage nurse.

## 2017-03-27 LAB — CANCER ANTIGEN 19-9: CAN 19-9: 36 U/mL — AB (ref 0–35)

## 2017-03-30 DIAGNOSIS — Z7901 Long term (current) use of anticoagulants: Secondary | ICD-10-CM | POA: Diagnosis not present

## 2017-03-30 DIAGNOSIS — Z86718 Personal history of other venous thrombosis and embolism: Secondary | ICD-10-CM | POA: Diagnosis not present

## 2017-04-02 ENCOUNTER — Ambulatory Visit: Payer: BLUE CROSS/BLUE SHIELD | Admitting: Nutrition

## 2017-04-02 ENCOUNTER — Other Ambulatory Visit (HOSPITAL_BASED_OUTPATIENT_CLINIC_OR_DEPARTMENT_OTHER): Payer: BLUE CROSS/BLUE SHIELD

## 2017-04-02 ENCOUNTER — Ambulatory Visit (HOSPITAL_BASED_OUTPATIENT_CLINIC_OR_DEPARTMENT_OTHER): Payer: BLUE CROSS/BLUE SHIELD

## 2017-04-02 DIAGNOSIS — C221 Intrahepatic bile duct carcinoma: Secondary | ICD-10-CM

## 2017-04-02 DIAGNOSIS — Z5111 Encounter for antineoplastic chemotherapy: Secondary | ICD-10-CM

## 2017-04-02 DIAGNOSIS — C787 Secondary malignant neoplasm of liver and intrahepatic bile duct: Secondary | ICD-10-CM | POA: Diagnosis not present

## 2017-04-02 LAB — COMPREHENSIVE METABOLIC PANEL
ALBUMIN: 3.8 g/dL (ref 3.5–5.0)
ALK PHOS: 155 U/L — AB (ref 40–150)
ALT: 61 U/L — AB (ref 0–55)
AST: 37 U/L — AB (ref 5–34)
Anion Gap: 7 mEq/L (ref 3–11)
BUN: 13.7 mg/dL (ref 7.0–26.0)
CALCIUM: 9.6 mg/dL (ref 8.4–10.4)
CO2: 26 mEq/L (ref 22–29)
CREATININE: 0.7 mg/dL (ref 0.6–1.1)
Chloride: 106 mEq/L (ref 98–109)
EGFR: >90 mL/min/{1.73_m2} (ref >90)
GLUCOSE: 78 mg/dL (ref 70–140)
Potassium: 4.4 mEq/L (ref 3.5–5.1)
Sodium: 139 mEq/L (ref 136–145)
Total Bilirubin: 0.49 mg/dL (ref 0.20–1.20)
Total Protein: 7.1 g/dL (ref 6.4–8.3)

## 2017-04-02 LAB — CBC WITH DIFFERENTIAL/PLATELET
BASO%: 1.4 % (ref 0.0–2.0)
BASOS ABS: 0.1 10*3/uL (ref 0.0–0.1)
EOS%: 2.5 % (ref 0.0–7.0)
Eosinophils Absolute: 0.1 10*3/uL (ref 0.0–0.5)
HEMATOCRIT: 35 % (ref 34.8–46.6)
HGB: 11.7 g/dL (ref 11.6–15.9)
LYMPH#: 1.8 10*3/uL (ref 0.9–3.3)
LYMPH%: 40.8 % (ref 14.0–49.7)
MCH: 29.9 pg (ref 25.1–34.0)
MCHC: 33.5 g/dL (ref 31.5–36.0)
MCV: 89.2 fL (ref 79.5–101.0)
MONO#: 0.3 10*3/uL (ref 0.1–0.9)
MONO%: 6.2 % (ref 0.0–14.0)
NEUT#: 2.2 10*3/uL (ref 1.5–6.5)
NEUT%: 49.1 % (ref 38.4–76.8)
PLATELETS: 217 10*3/uL (ref 145–400)
RBC: 3.92 10*6/uL (ref 3.70–5.45)
RDW: 18.1 % — ABNORMAL HIGH (ref 11.2–14.5)
WBC: 4.4 10*3/uL (ref 3.9–10.3)

## 2017-04-02 MED ORDER — SODIUM CHLORIDE 0.9 % IV SOLN
25.0000 mg/m2 | Freq: Once | INTRAVENOUS | Status: AC
  Administered 2017-04-02: 43 mg via INTRAVENOUS
  Filled 2017-04-02: qty 43

## 2017-04-02 MED ORDER — SODIUM CHLORIDE 0.9% FLUSH
10.0000 mL | INTRAVENOUS | Status: DC | PRN
  Administered 2017-04-02: 10 mL
  Filled 2017-04-02: qty 10

## 2017-04-02 MED ORDER — PALONOSETRON HCL INJECTION 0.25 MG/5ML
INTRAVENOUS | Status: AC
  Filled 2017-04-02: qty 5

## 2017-04-02 MED ORDER — PALONOSETRON HCL INJECTION 0.25 MG/5ML
0.2500 mg | Freq: Once | INTRAVENOUS | Status: AC
  Administered 2017-04-02: 0.25 mg via INTRAVENOUS

## 2017-04-02 MED ORDER — DEXTROSE-NACL 5-0.45 % IV SOLN
Freq: Once | INTRAVENOUS | Status: AC
  Administered 2017-04-02: 11:00:00 via INTRAVENOUS
  Filled 2017-04-02: qty 10

## 2017-04-02 MED ORDER — HEPARIN SOD (PORK) LOCK FLUSH 100 UNIT/ML IV SOLN
500.0000 [IU] | Freq: Once | INTRAVENOUS | Status: AC | PRN
  Administered 2017-04-02: 500 [IU]
  Filled 2017-04-02: qty 5

## 2017-04-02 MED ORDER — SODIUM CHLORIDE 0.9 % IV SOLN
Freq: Once | INTRAVENOUS | Status: AC
  Administered 2017-04-02: 14:00:00 via INTRAVENOUS
  Filled 2017-04-02: qty 5

## 2017-04-02 MED ORDER — SODIUM CHLORIDE 0.9 % IV SOLN
Freq: Once | INTRAVENOUS | Status: AC
  Administered 2017-04-02: 10:00:00 via INTRAVENOUS

## 2017-04-02 MED ORDER — GEMCITABINE HCL CHEMO INJECTION 1 GM/26.3ML
1000.0000 mg/m2 | Freq: Once | INTRAVENOUS | Status: AC
  Administered 2017-04-02: 1710 mg via INTRAVENOUS
  Filled 2017-04-02: qty 44.97

## 2017-04-02 NOTE — Patient Instructions (Signed)
[facility] Discharge Instructions for Patients Receiving Chemotherapy  Today you received the following chemotherapy agents :  Gemzar  To help prevent nausea and vomiting after your treatment, we encourage you to take your nausea medication.   If you develop nausea and vomiting that is not controlled by your nausea medication, call the clinic.   BELOW ARE SYMPTOMS THAT SHOULD BE REPORTED IMMEDIATELY:  *FEVER GREATER THAN 100.5 F  *CHILLS WITH OR WITHOUT FEVER  NAUSEA AND VOMITING THAT IS NOT CONTROLLED WITH YOUR NAUSEA MEDICATION  *UNUSUAL SHORTNESS OF BREATH  *UNUSUAL BRUISING OR BLEEDING  TENDERNESS IN MOUTH AND THROAT WITH OR WITHOUT PRESENCE OF ULCERS  *URINARY PROBLEMS  *BOWEL PROBLEMS  UNUSUAL RASH Items with * indicate a potential emergency and should be followed up as soon as possible.  Feel free to call the clinic should you have any questions or concerns. The clinic phone number is [phone].  Please show the CHEMO ALERT CARD at check-in to the Emergency Department and triage nurse.   

## 2017-04-02 NOTE — Progress Notes (Signed)
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 Weight improved slightly and documented as 133.3 pounds September 21 improved from 131.1 pounds August 31. Patient reports she has occasional constipation but this is improved. She is eating well and feels well. She has increased ensure to 2 bottles daily as previously recommended. She has no questions or concerns.  Nutrition diagnosis: Unintended weight loss resolved.  Encouraged patient to contact me if she develops further nutrition issues or has any questions and concerns.  She has my contact information.  **Disclaimer: This note was dictated with voice recognition software. Similar sounding words can inadvertently be transcribed and this note may contain transcription errors which may not have been corrected upon publication of note.**

## 2017-04-03 LAB — CANCER ANTIGEN 19-9: CAN 19-9: 40 U/mL — AB (ref 0–35)

## 2017-04-14 ENCOUNTER — Telehealth: Payer: Self-pay | Admitting: *Deleted

## 2017-04-14 NOTE — Telephone Encounter (Signed)
Spoke with daughter [name] and informed her that pt will need to see Regan Rakers, NP on Friday 04/24/15 prior to chemo.  Pt also will be scheduled for another cycle of chemo on Friday 04/30/17.   [name] voiced understanding. Schedule message sent.

## 2017-04-14 NOTE — Telephone Encounter (Signed)
Please reschedule to next week   Truitt Merle MD

## 2017-04-14 NOTE — Telephone Encounter (Signed)
Received call from daughter [name] re:  Pt is out of town for daughter's wedding.  Pt cannot get back in town due to [place].  Chances to get back home safely is very slim per [name].  Pt will miss all appts for Friday  04/16/17.  [name] wanted to know what Dr. Burr Medico would suggest for making up missed chemo appt. [facility]   Phone      [phone].

## 2017-04-16 ENCOUNTER — Ambulatory Visit: Payer: BLUE CROSS/BLUE SHIELD

## 2017-04-16 ENCOUNTER — Ambulatory Visit: Payer: BLUE CROSS/BLUE SHIELD | Admitting: Nurse Practitioner

## 2017-04-16 ENCOUNTER — Telehealth: Payer: Self-pay | Admitting: Hematology

## 2017-04-16 ENCOUNTER — Other Ambulatory Visit: Payer: BLUE CROSS/BLUE SHIELD

## 2017-04-16 NOTE — Telephone Encounter (Signed)
Scheduled appt per 10/10 sch message - left message with aptp date and time on pts voicemail . Tried calling daughter - no answer and unable to leave message due to full voicemail box .

## 2017-04-19 NOTE — Telephone Encounter (Signed)
Entered in error

## 2017-04-20 DIAGNOSIS — Z7901 Long term (current) use of anticoagulants: Secondary | ICD-10-CM | POA: Diagnosis not present

## 2017-04-20 DIAGNOSIS — N390 Urinary tract infection, site not specified: Secondary | ICD-10-CM | POA: Diagnosis not present

## 2017-04-20 DIAGNOSIS — R300 Dysuria: Secondary | ICD-10-CM | POA: Diagnosis not present

## 2017-04-20 DIAGNOSIS — R319 Hematuria, unspecified: Secondary | ICD-10-CM | POA: Diagnosis not present

## 2017-04-23 ENCOUNTER — Other Ambulatory Visit (HOSPITAL_BASED_OUTPATIENT_CLINIC_OR_DEPARTMENT_OTHER): Payer: BLUE CROSS/BLUE SHIELD

## 2017-04-23 ENCOUNTER — Telehealth: Payer: Self-pay | Admitting: Nurse Practitioner

## 2017-04-23 ENCOUNTER — Ambulatory Visit (HOSPITAL_BASED_OUTPATIENT_CLINIC_OR_DEPARTMENT_OTHER): Payer: BLUE CROSS/BLUE SHIELD | Admitting: Nurse Practitioner

## 2017-04-23 ENCOUNTER — Ambulatory Visit (HOSPITAL_BASED_OUTPATIENT_CLINIC_OR_DEPARTMENT_OTHER): Payer: BLUE CROSS/BLUE SHIELD

## 2017-04-23 ENCOUNTER — Encounter: Payer: Self-pay | Admitting: Nurse Practitioner

## 2017-04-23 ENCOUNTER — Ambulatory Visit: Payer: BLUE CROSS/BLUE SHIELD

## 2017-04-23 VITALS — BP 116/74 | HR 71 | Temp 97.5°F | Resp 18 | Ht 68.5 in | Wt 136.3 lb

## 2017-04-23 DIAGNOSIS — C787 Secondary malignant neoplasm of liver and intrahepatic bile duct: Secondary | ICD-10-CM

## 2017-04-23 DIAGNOSIS — K5900 Constipation, unspecified: Secondary | ICD-10-CM | POA: Diagnosis not present

## 2017-04-23 DIAGNOSIS — F419 Anxiety disorder, unspecified: Secondary | ICD-10-CM | POA: Diagnosis not present

## 2017-04-23 DIAGNOSIS — C221 Intrahepatic bile duct carcinoma: Secondary | ICD-10-CM

## 2017-04-23 DIAGNOSIS — D649 Anemia, unspecified: Secondary | ICD-10-CM

## 2017-04-23 DIAGNOSIS — Z5111 Encounter for antineoplastic chemotherapy: Secondary | ICD-10-CM | POA: Diagnosis not present

## 2017-04-23 DIAGNOSIS — F329 Major depressive disorder, single episode, unspecified: Secondary | ICD-10-CM | POA: Diagnosis not present

## 2017-04-23 LAB — CBC WITH DIFFERENTIAL/PLATELET
BASO%: 1.1 % (ref 0.0–2.0)
BASOS ABS: 0.1 10*3/uL (ref 0.0–0.1)
EOS%: 4.7 % (ref 0.0–7.0)
Eosinophils Absolute: 0.2 10*3/uL (ref 0.0–0.5)
HEMATOCRIT: 34.6 % — AB (ref 34.8–46.6)
HEMOGLOBIN: 11.4 g/dL — AB (ref 11.6–15.9)
LYMPH#: 1.7 10*3/uL (ref 0.9–3.3)
LYMPH%: 38.9 % (ref 14.0–49.7)
MCH: 30.7 pg (ref 25.1–34.0)
MCHC: 32.9 g/dL (ref 31.5–36.0)
MCV: 93.3 fL (ref 79.5–101.0)
MONO#: 0.5 10*3/uL (ref 0.1–0.9)
MONO%: 11.4 % (ref 0.0–14.0)
NEUT#: 2 10*3/uL (ref 1.5–6.5)
NEUT%: 43.9 % (ref 38.4–76.8)
PLATELETS: 221 10*3/uL (ref 145–400)
RBC: 3.71 10*6/uL (ref 3.70–5.45)
RDW: 18.1 % — ABNORMAL HIGH (ref 11.2–14.5)
WBC: 4.5 10*3/uL (ref 3.9–10.3)
nRBC: 0 % (ref 0–0)

## 2017-04-23 LAB — COMPREHENSIVE METABOLIC PANEL
ALBUMIN: 3.5 g/dL (ref 3.5–5.0)
ALK PHOS: 117 U/L (ref 40–150)
ALT: 15 U/L (ref 0–55)
ANION GAP: 8 meq/L (ref 3–11)
AST: 22 U/L (ref 5–34)
BUN: 14.1 mg/dL (ref 7.0–26.0)
CALCIUM: 9 mg/dL (ref 8.4–10.4)
CHLORIDE: 108 meq/L (ref 98–109)
CO2: 24 mEq/L (ref 22–29)
Creatinine: 0.7 mg/dL (ref 0.6–1.1)
Glucose: 86 mg/dl (ref 70–140)
POTASSIUM: 4.1 meq/L (ref 3.5–5.1)
Sodium: 140 mEq/L (ref 136–145)
Total Bilirubin: 0.43 mg/dL (ref 0.20–1.20)
Total Protein: 6.9 g/dL (ref 6.4–8.3)

## 2017-04-23 MED ORDER — SODIUM CHLORIDE 0.9 % IV SOLN
Freq: Once | INTRAVENOUS | Status: AC
  Administered 2017-04-23: 14:00:00 via INTRAVENOUS
  Filled 2017-04-23: qty 5

## 2017-04-23 MED ORDER — SODIUM CHLORIDE 0.9 % IV SOLN
Freq: Once | INTRAVENOUS | Status: AC
  Administered 2017-04-23: 13:00:00 via INTRAVENOUS

## 2017-04-23 MED ORDER — PALONOSETRON HCL INJECTION 0.25 MG/5ML
INTRAVENOUS | Status: AC
  Filled 2017-04-23: qty 5

## 2017-04-23 MED ORDER — SODIUM CHLORIDE 0.9 % IV SOLN
25.0000 mg/m2 | Freq: Once | INTRAVENOUS | Status: AC
  Administered 2017-04-23: 43 mg via INTRAVENOUS
  Filled 2017-04-23: qty 43

## 2017-04-23 MED ORDER — HEPARIN SOD (PORK) LOCK FLUSH 100 UNIT/ML IV SOLN
500.0000 [IU] | Freq: Once | INTRAVENOUS | Status: AC | PRN
  Administered 2017-04-23: 500 [IU]
  Filled 2017-04-23: qty 5

## 2017-04-23 MED ORDER — SODIUM CHLORIDE 0.9% FLUSH
10.0000 mL | INTRAVENOUS | Status: DC | PRN
  Administered 2017-04-23: 10 mL
  Filled 2017-04-23: qty 10

## 2017-04-23 MED ORDER — POTASSIUM CHLORIDE 2 MEQ/ML IV SOLN
Freq: Once | INTRAVENOUS | Status: AC
  Administered 2017-04-23: 11:00:00 via INTRAVENOUS
  Filled 2017-04-23: qty 10

## 2017-04-23 MED ORDER — SODIUM CHLORIDE 0.9 % IV SOLN
1000.0000 mg/m2 | Freq: Once | INTRAVENOUS | Status: AC
  Administered 2017-04-23: 1710 mg via INTRAVENOUS
  Filled 2017-04-23: qty 44.97

## 2017-04-23 MED ORDER — PALONOSETRON HCL INJECTION 0.25 MG/5ML
0.2500 mg | Freq: Once | INTRAVENOUS | Status: AC
  Administered 2017-04-23: 0.25 mg via INTRAVENOUS

## 2017-04-23 NOTE — Telephone Encounter (Signed)
No 10/19 los.  

## 2017-04-23 NOTE — Patient Instructions (Signed)
[facility] Discharge Instructions for Patients Receiving Chemotherapy  Today you received the following chemotherapy agents Gemzar and Cisplatin  To help prevent nausea and vomiting after your treatment, we encourage you to take your nausea medication as directed   If you develop nausea and vomiting that is not controlled by your nausea medication, call the clinic.   BELOW ARE SYMPTOMS THAT SHOULD BE REPORTED IMMEDIATELY:  *FEVER GREATER THAN 100.5 F  *CHILLS WITH OR WITHOUT FEVER  NAUSEA AND VOMITING THAT IS NOT CONTROLLED WITH YOUR NAUSEA MEDICATION  *UNUSUAL SHORTNESS OF BREATH  *UNUSUAL BRUISING OR BLEEDING  TENDERNESS IN MOUTH AND THROAT WITH OR WITHOUT PRESENCE OF ULCERS  *URINARY PROBLEMS  *BOWEL PROBLEMS  UNUSUAL RASH Items with * indicate a potential emergency and should be followed up as soon as possible.  Feel free to call the clinic should you have any questions or concerns. The clinic phone number is [phone].  Please show the CHEMO ALERT CARD at check-in to the Emergency Department and triage nurse.   

## 2017-04-23 NOTE — Patient Instructions (Signed)
Implanted Port Home Guide An implanted port is a type of central line that is placed under the skin. Central lines are used to provide IV access when treatment or nutrition needs to be given through a person's veins. Implanted ports are used for long-term IV access. An implanted port may be placed because:  You need IV medicine that would be irritating to the small veins in your hands or arms.  You need long-term IV medicines, such as antibiotics.  You need IV nutrition for a long period.  You need frequent blood draws for lab tests.  You need dialysis.  Implanted ports are usually placed in the chest area, but they can also be placed in the upper arm, the abdomen, or the leg. An implanted port has two main parts:  Reservoir. The reservoir is round and will appear as a small, raised area under your skin. The reservoir is the part where a needle is inserted to give medicines or draw blood.  Catheter. The catheter is a thin, flexible tube that extends from the reservoir. The catheter is placed into a large vein. Medicine that is inserted into the reservoir goes into the catheter and then into the vein.  How will I care for my incision site? Do not get the incision site wet. Bathe or shower as directed by your health care provider. How is my port accessed? Special steps must be taken to access the port:  Before the port is accessed, a numbing cream can be placed on the skin. This helps numb the skin over the port site.  Your health care provider uses a sterile technique to access the port. ? Your health care provider must put on a mask and sterile gloves. ? The skin over your port is cleaned carefully with an antiseptic and allowed to dry. ? The port is gently pinched between sterile gloves, and a needle is inserted into the port.  Only "non-coring" port needles should be used to access the port. Once the port is accessed, a blood return should be checked. This helps ensure that the port  is in the vein and is not clogged.  If your port needs to remain accessed for a constant infusion, a clear (transparent) bandage will be placed over the needle site. The bandage and needle will need to be changed every week, or as directed by your health care provider.  Keep the bandage covering the needle clean and dry. Do not get it wet. Follow your health care provider's instructions on how to take a shower or bath while the port is accessed.  If your port does not need to stay accessed, no bandage is needed over the port.  What is flushing? Flushing helps keep the port from getting clogged. Follow your health care provider's instructions on how and when to flush the port. Ports are usually flushed with saline solution or a medicine called heparin. The need for flushing will depend on how the port is used.  If the port is used for intermittent medicines or blood draws, the port will need to be flushed: ? After medicines have been given. ? After blood has been drawn. ? As part of routine maintenance.  If a constant infusion is running, the port may not need to be flushed.  How long will my port stay implanted? The port can stay in for as long as your health care provider thinks it is needed. When it is time for the port to come out, surgery will be   done to remove it. The procedure is similar to the one performed when the port was put in. When should I seek immediate medical care? When you have an implanted port, you should seek immediate medical care if:  You notice a bad smell coming from the incision site.  You have swelling, redness, or drainage at the incision site.  You have more swelling or pain at the port site or the surrounding area.  You have a fever that is not controlled with medicine.  This information is not intended to replace advice given to you by your health care provider. Make sure you discuss any questions you have with your health care provider. Document  Released: 06/22/2005 Document Revised: 11/28/2015 Document Reviewed: 02/27/2013 Elsevier Interactive Patient Education  2017 [employer]vier Inc.  

## 2017-04-23 NOTE — Progress Notes (Signed)
Per Cira Rue, NP, okay to release pt treatment with a urine output of only 200 mLs.

## 2017-04-23 NOTE — Progress Notes (Signed)
[facility]  Telephone:([phone] Fax:([phone]) [phone]  Clinic Follow up Note   Patient Care Team: Deland Pretty, MD as PCP - General (Internal Medicine) Stark Klein, MD as Consulting Physician (General Surgery) Milus Banister, MD as Attending Physician (Gastroenterology) Truitt Merle, MD as Consulting Physician (Hematology) 04/23/2017  SUMMARY OF ONCOLOGIC HISTORY: Oncology History   Cancer Staging Cholangiocarcinoma metastatic to liver [facility]) Staging form: Intrahepatic Bile Duct, AJCC 8th Edition - Clinical stage from 01/01/2017: Stage IV (cT2, cNX, cM1) - Signed by Truitt Merle, MD on 01/17/2017       Cholangiocarcinoma metastatic to liver ([place])   12/17/2016 Imaging    MR Abdomen MRCP w wo conteast  IMPRESSION: 1. Widespread hepatic metastasis, without abdominal primary identified. 2. Right twelfth rib metastasis. 3. Soft tissue fullness within the cecum, not entirely imaged. This could represent prominent stool or less likely a colon primary. Consider dedicated chest and pelvic CT or PET to evaluate for primary. 4. Suspicious right cardiophrenic angle adenopathy. Indeterminate/equivocal portal caval node. 5. Left adrenal adenoma. These results will be called to the ordering clinician or representative by the Radiologist Assistant, and communication documented in the PACS or zVision Dashboard.      12/24/2016 Procedure    Upper Endoscopy Nodular distal gastritis, biospied.      12/24/2016 Procedure    Colonoscopy  - The entire examined colon is normal on direct and retroflexion views. - No polyps or cancers.      12/24/2016 Pathology Results     Diagnosis Stomach, biopsy, distal - REACTIVE GASTROPATHY - NO MALIGNANCY IDENTIFIED      12/29/2016 Imaging    CT CAP w contrast  IMPRESSION: 1. No definite CT findings to suggest primary source of the apparent hepatic metastases. Gallbladder is contracted and there is some wall irregularity in the  body of the gallbladder but no gross gallbladder mass lesion is evident. No biliary dilatation. Previous MRI raise the question of the cecal lesion, but there is no evidence for gross cecal mass by CT today. There is some minimal fullness in the distal stomach, but this is a fairly typical appearance on CT and given the smooth appearance of the distal gastric wall and symmetric tissue fullness, neoplasm is felt to be not likely. 2. Hepatic metastases as seen on recent abdominal MRI. 3. Borderline to mild gastrohepatic, hepatoduodenal ligament, and retroperitoneal lymphadenopathy.  4. PET-CT may prove helpful to further evaluate.      01/01/2017 Imaging    US Biopsy  IMPRESSION: Status post ultrasound-guided biopsy of right liver tumor. Tissue specimen sent to pathology for complete histopathologic analysis.      01/01/2017 Procedure    Ultrasound-guided liver biopsy      01/01/2017 Pathology Results    Liver biopsy showed adenocarcinoma, consistent with cholangiocarcinoma.       01/01/2017 Initial Diagnosis    Cholangiocarcinoma metastatic to liver ([place])      01/27/2017 Imaging    NM PET scan IMPRESSION: Dominant geographic mass in the right hepatic lobe is heterogeneous but hypermetabolic with maximum SUV of 7.5. Likely metastatic hypermetabolic foci are present primarily in the right hepatic lobe but also involving the medial and lateral segments of the left hepatic lobe. Small lesion medially in the right twelfth rib subcostal space with very low metabolic activity with maximum SUV of 1.9, previously seen on the prior MRI of 12/17/2016. I am skeptical that this is a metastatic lesion. This could well be a schwannoma or small vascular malformation. This  does appear increased in size compared to 6/20 12/2012, and merits surveillance. Mild splenomegaly but without focal splenic hypermetabolic activity. 2.0 by 1.6 cm hypodense right thyroid mass is mildly hypermetabolic. A  significant minority of such lesions can turn out to represent thyroid cancer. Thyroid sonography is recommended for further characterization. Other imaging findings of potential clinical significance: Suspected cholelithiasis. Aortic Atherosclerosis (ICD10-I70.0).      02/12/2017 -  Chemotherapy    first line chemo cisplatin and gemcitabine. chemotherapy q week x 2 weeks with 1 week off starting 02/12/17      CURRENT THERAPY: first line chemo cisplatin and gemcitabine. chemotherapy q week x 2 weeks with 1 week off starting 02/12/17  INTERVAL HISTORY: Cheryl [name] returns today for follow-up as scheduled. She tolerated cycle 3 gemcitabine/Abraxane on days 1 and 8 well overall. Her energy and appetite are good, weight is up 3 pounds. She has some constipation, managed with prunes and laxative if no BM and 3 days. she is on antibiotic for UTI, symptoms have resolved and she will complete course. She stopped Lexapro, only took it for 1 month without much improvement in anxiety and depression.   REVIEW OF SYSTEMS:   Constitutional: Denies fatigue, fevers, chills or abnormal weight loss Eyes: Denies blurriness of vision Ears, nose, mouth, throat, and face: Denies mucositis or sore throat Respiratory: Denies cough, dyspnea or wheezes Cardiovascular: Denies palpitation, chest discomfort or lower extremity swelling Gastrointestinal:  Denies nausea, vomiting, diarrhea, heartburn or change in bowel habits (+) mild constipation managed with prunes and laxative when necessary Skin: Denies abnormal skin rashes Lymphatics: Denies new lymphadenopathy or easy bruising Neurological:Denies numbness, tingling or new weaknesses Behavioral/Psych: (+) anxiety, depression. Stopped Lexapro after 1 month All other systems were reviewed with the patient and are negative.  MEDICAL HISTORY:  Past Medical History:  Diagnosis Date  . History of blood clots   . Liver mass   . PONV (postoperative nausea and vomiting)     nausea   SURGICAL HISTORY: Past Surgical History:  Procedure Laterality Date  . ABDOMINAL HYSTERECTOMY     complete  . CESAREAN SECTION     x 1  . COLONOSCOPY WITH PROPOFOL N/A 12/24/2016   Procedure: COLONOSCOPY WITH PROPOFOL;  Surgeon: Milus Banister, MD;  Location: [facility] ENDOSCOPY;  Service: Endoscopy;  Laterality: N/A;  . ESOPHAGOGASTRODUODENOSCOPY (EGD) WITH PROPOFOL N/A 12/24/2016   Procedure: ESOPHAGOGASTRODUODENOSCOPY (EGD) WITH PROPOFOL;  Surgeon: Milus Banister, MD;  Location: [facility] ENDOSCOPY;  Service: Endoscopy;  Laterality: N/A;  . IR FLUORO GUIDE PORT INSERTION RIGHT  03/09/2017  . IR US GUIDE VASC ACCESS RIGHT  03/09/2017    I have reviewed the social history and family history with the patient and they are unchanged from previous note.  ALLERGIES:  is allergic to dilaudid [hydromorphone hcl]; sulfa antibiotics; and tramadol.  MEDICATIONS:  Current Outpatient Prescriptions  Medication Sig Dispense Refill  . acetaminophen (TYLENOL) 500 MG tablet Take 1,000 mg by mouth every 6 (six) hours as needed (for pain.).    Marland Kitchen amoxicillin-clavulanate (AUGMENTIN) 500-125 MG tablet take 1 tablet by mouth twice a day for 7 days  0  . lidocaine-prilocaine (EMLA) cream Apply 1 application topically as needed. 30 g 2  . prochlorperazine (COMPAZINE) 10 MG tablet Take 1 tablet (10 mg total) by mouth every 6 (six) hours as needed (Nausea or vomiting). 30 tablet 1  . warfarin (COUMADIN) 4 MG tablet Take 4 mg by mouth daily at 6 PM.     . escitalopram (  LEXAPRO) 10 MG tablet Take 1 tablet (10 mg total) by mouth daily. 30 tablet 2  . ondansetron (ZOFRAN) 8 MG tablet Take 1 tablet (8 mg total) by mouth 2 (two) times daily as needed. Start on the third day after chemotherapy. 30 tablet 1  . traMADol (ULTRAM) 50 MG tablet Take 1 tablet (50 mg total) by mouth every 6 (six) hours as needed. 30 tablet 1   No current facility-administered medications for this visit.     PHYSICAL EXAMINATION: ECOG  PERFORMANCE STATUS: 1 - Symptomatic but completely ambulatory  Vitals:   04/23/17 0956  BP: 116/74  Pulse: 71  Resp: 18  Temp: (!) 97.5 F (36.4 C)  SpO2: 100%   Filed Weights   04/23/17 0956  Weight: 136 lb 4.8 oz (61.8 kg)    GENERAL:alert, no distress and comfortable SKIN: skin color, texture, turgor are normal, no rashes or significant lesions EYES: normal, Conjunctiva are pink and non-injected, sclera clear OROPHARYNX:no exudate, no erythema and lips, buccal mucosa, and tongue normal  NECK: supple, thyroid normal size, non-tender, without nodularity LYMPH:  no palpable cervical or supraclavicular lymphadenopathy  LUNGS: clear to auscultation bilaterally with normal breathing effort HEART: regular rate & rhythm, S1 and S2 normal, no murmurs and no lower extremity edema ABDOMEN: abdomen soft, non-tender and normal bowel sounds. No palpable organomegaly or masses Musculoskeletal:no cyanosis of digits and no clubbing  NEURO: alert & oriented x 3 with fluent speech, no focal motor/sensory deficits  LABORATORY DATA:  I have reviewed the data as listed CBC Latest Ref Rng & Units 04/23/2017 04/02/2017 03/26/2017  WBC 3.9 - 10.3 10e3/uL 4.5 4.4 7.6  Hemoglobin 11.6 - 15.9 g/dL 11.4(L) 11.7 11.4(L)  Hematocrit 34.8 - 46.6 % 34.6(L) 35.0 33.8(L)  Platelets 145 - 400 10e3/uL 221 217 281    CMP Latest Ref Rng & Units 04/23/2017 04/02/2017 03/26/2017  Glucose 70 - 140 mg/dl 86 78 83  BUN 7.0 - 26.0 mg/dL 14.1 13.7 18.4  Creatinine 0.6 - 1.1 mg/dL 0.7 0.7 0.7  Sodium 136 - 145 mEq/L 140 139 140  Potassium 3.5 - 5.1 mEq/L 4.1 4.4 4.3  Chloride 101 - 111 mmol/L - - -  CO2 22 - 29 mEq/L 24 26 25   Calcium 8.4 - 10.4 mg/dL 9.0 9.6 9.3  Total Protein 6.4 - 8.3 g/dL 6.9 7.1 7.0  Total Bilirubin 0.20 - 1.20 mg/dL 0.43 0.49 0.42  Alkaline Phos 40 - 150 U/L 117 155(H) 155(H)  AST 5 - 34 U/L 22 37(H) 27  ALT 0 - 55 U/L 15 61(H) 24    RADIOGRAPHIC STUDIES: I have personally reviewed the  radiological images as listed and agreed with the findings in the report. No results found.   ASSESSMENT & PLAN:61 y.o. female, with past medical history of DVT on Coumadin, presented with intermittent abdominal pain, and 10 pound weight loss.  1. 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 Constipation 3. Right flank pain 4. Nausea 5. Depression 6. Mild anemia 7. Goals of care discussion  Ms. Krone appears to be tolerating chemotherapy very well. Vital signs and weight stable, Cmet is normal, CBC indicates very mild anemia, 11.4, no transfusion required. She does not think she was on Lexapro for long enough but discontinued it on her own after 1 month. She still has some anxiety and depression, she will restart Lexapro if after 6-8 weeks if she does not notice improvement in her mood or has side effects, consider dosage  or medication change. She will complete antibiotics for UTI. I provided her with a jury excuse note per her request. She'll proceed with cycle 4 gemcitabine/cisplatinday 1 today, return in 1 week for lab, flush, day 8. She will have a restaging CT abdomen pelvis on 05/05/2017 with M.D. Follow-up on 11/2.   PLAN: -labs reviewed, adequate for treatment, proceed with cycle 4 day 1 gemcitabine/Abraxane -return in 1 week for lab, flush, chemotherapy day 8 gemcitabine/Abraxane -Neulasta on day 9 if necessary -restaging CT 05/05/2017 -Lab, flush, Dr. Burr Medico follow-up, chemotherapy in 2 weeks -restart Lexapro -Jury excuse letter provided  All questions were answered. The patient knows to call the clinic with any problems, questions or concerns. No barriers to learning was detected.     Cheryl Feeling, NP 04/23/17

## 2017-04-24 LAB — CANCER ANTIGEN 19-9: CAN 19-9: 45 U/mL — AB (ref 0–35)

## 2017-04-27 DIAGNOSIS — Z86718 Personal history of other venous thrombosis and embolism: Secondary | ICD-10-CM | POA: Diagnosis not present

## 2017-04-27 DIAGNOSIS — Z7901 Long term (current) use of anticoagulants: Secondary | ICD-10-CM | POA: Diagnosis not present

## 2017-04-29 ENCOUNTER — Ambulatory Visit: Payer: BLUE CROSS/BLUE SHIELD

## 2017-04-29 ENCOUNTER — Telehealth: Payer: Self-pay

## 2017-04-29 NOTE — Telephone Encounter (Signed)
Printed avs and calender for upcoming appointment. Per 10/25 los, also faxed ref. To [facility]

## 2017-04-29 NOTE — Telephone Encounter (Signed)
Spoke with patient concerning her upcoming appointment for a injection.per 10/25 sch message

## 2017-04-30 ENCOUNTER — Other Ambulatory Visit (HOSPITAL_BASED_OUTPATIENT_CLINIC_OR_DEPARTMENT_OTHER): Payer: BLUE CROSS/BLUE SHIELD

## 2017-04-30 ENCOUNTER — Other Ambulatory Visit: Payer: Self-pay | Admitting: Nurse Practitioner

## 2017-04-30 ENCOUNTER — Ambulatory Visit (HOSPITAL_BASED_OUTPATIENT_CLINIC_OR_DEPARTMENT_OTHER): Payer: BLUE CROSS/BLUE SHIELD

## 2017-04-30 VITALS — BP 123/66 | HR 69 | Temp 98.5°F | Resp 18

## 2017-04-30 DIAGNOSIS — C787 Secondary malignant neoplasm of liver and intrahepatic bile duct: Secondary | ICD-10-CM

## 2017-04-30 DIAGNOSIS — C221 Intrahepatic bile duct carcinoma: Secondary | ICD-10-CM | POA: Diagnosis not present

## 2017-04-30 DIAGNOSIS — Z5111 Encounter for antineoplastic chemotherapy: Secondary | ICD-10-CM | POA: Diagnosis not present

## 2017-04-30 DIAGNOSIS — Z5189 Encounter for other specified aftercare: Secondary | ICD-10-CM

## 2017-04-30 LAB — COMPREHENSIVE METABOLIC PANEL
ALBUMIN: 3.6 g/dL (ref 3.5–5.0)
ALK PHOS: 123 U/L (ref 40–150)
ALT: 51 U/L (ref 0–55)
ANION GAP: 7 meq/L (ref 3–11)
AST: 37 U/L — AB (ref 5–34)
BUN: 19.1 mg/dL (ref 7.0–26.0)
CALCIUM: 9 mg/dL (ref 8.4–10.4)
CHLORIDE: 108 meq/L (ref 98–109)
CO2: 25 mEq/L (ref 22–29)
Creatinine: 0.7 mg/dL (ref 0.6–1.1)
Glucose: 84 mg/dl (ref 70–140)
POTASSIUM: 4.2 meq/L (ref 3.5–5.1)
Sodium: 140 mEq/L (ref 136–145)
Total Bilirubin: 0.48 mg/dL (ref 0.20–1.20)
Total Protein: 6.9 g/dL (ref 6.4–8.3)

## 2017-04-30 LAB — CBC WITH DIFFERENTIAL/PLATELET
BASO%: 0.8 % (ref 0.0–2.0)
BASOS ABS: 0 10*3/uL (ref 0.0–0.1)
EOS ABS: 0.1 10*3/uL (ref 0.0–0.5)
EOS%: 1.3 % (ref 0.0–7.0)
HEMATOCRIT: 33.9 % — AB (ref 34.8–46.6)
HEMOGLOBIN: 11.1 g/dL — AB (ref 11.6–15.9)
LYMPH#: 1.8 10*3/uL (ref 0.9–3.3)
LYMPH%: 48.7 % (ref 14.0–49.7)
MCH: 30.3 pg (ref 25.1–34.0)
MCHC: 32.7 g/dL (ref 31.5–36.0)
MCV: 92.6 fL (ref 79.5–101.0)
MONO#: 0.3 10*3/uL (ref 0.1–0.9)
MONO%: 6.9 % (ref 0.0–14.0)
NEUT#: 1.6 10*3/uL (ref 1.5–6.5)
NEUT%: 42.3 % (ref 38.4–76.8)
Platelets: 109 10*3/uL — ABNORMAL LOW (ref 145–400)
RBC: 3.66 10*6/uL — ABNORMAL LOW (ref 3.70–5.45)
RDW: 16.6 % — AB (ref 11.2–14.5)
WBC: 3.8 10*3/uL — ABNORMAL LOW (ref 3.9–10.3)

## 2017-04-30 MED ORDER — PEGFILGRASTIM 6 MG/0.6ML ~~LOC~~ PSKT
6.0000 mg | PREFILLED_SYRINGE | Freq: Once | SUBCUTANEOUS | Status: AC
Start: 2017-04-30 — End: 2017-04-30
  Administered 2017-04-30: 6 mg via SUBCUTANEOUS
  Filled 2017-04-30: qty 0.6

## 2017-04-30 MED ORDER — SODIUM CHLORIDE 0.9 % IV SOLN
Freq: Once | INTRAVENOUS | Status: AC
  Administered 2017-04-30: 10:00:00 via INTRAVENOUS

## 2017-04-30 MED ORDER — HEPARIN SOD (PORK) LOCK FLUSH 100 UNIT/ML IV SOLN
500.0000 [IU] | Freq: Once | INTRAVENOUS | Status: AC | PRN
  Administered 2017-04-30: 500 [IU]
  Filled 2017-04-30: qty 5

## 2017-04-30 MED ORDER — POTASSIUM CHLORIDE 2 MEQ/ML IV SOLN
Freq: Once | INTRAVENOUS | Status: AC
  Administered 2017-04-30: 10:00:00 via INTRAVENOUS
  Filled 2017-04-30: qty 10

## 2017-04-30 MED ORDER — PALONOSETRON HCL INJECTION 0.25 MG/5ML
INTRAVENOUS | Status: AC
  Filled 2017-04-30: qty 5

## 2017-04-30 MED ORDER — SODIUM CHLORIDE 0.9% FLUSH
10.0000 mL | INTRAVENOUS | Status: DC | PRN
  Administered 2017-04-30: 10 mL
  Filled 2017-04-30: qty 10

## 2017-04-30 MED ORDER — SODIUM CHLORIDE 0.9 % IV SOLN
Freq: Once | INTRAVENOUS | Status: AC
  Administered 2017-04-30: 13:00:00 via INTRAVENOUS
  Filled 2017-04-30: qty 5

## 2017-04-30 MED ORDER — SODIUM CHLORIDE 0.9 % IV SOLN
1000.0000 mg/m2 | Freq: Once | INTRAVENOUS | Status: AC
  Administered 2017-04-30: 1710 mg via INTRAVENOUS
  Filled 2017-04-30: qty 44.97

## 2017-04-30 MED ORDER — SODIUM CHLORIDE 0.9 % IV SOLN
25.0000 mg/m2 | Freq: Once | INTRAVENOUS | Status: AC
  Administered 2017-04-30: 43 mg via INTRAVENOUS
  Filled 2017-04-30: qty 43

## 2017-04-30 MED ORDER — PALONOSETRON HCL INJECTION 0.25 MG/5ML
0.2500 mg | Freq: Once | INTRAVENOUS | Status: AC
  Administered 2017-04-30: 0.25 mg via INTRAVENOUS

## 2017-04-30 NOTE — Patient Instructions (Signed)
[facility] Discharge Instructions for Patients Receiving Chemotherapy  Today you received the following chemotherapy agents Gemzar and Cisplatin  To help prevent nausea and vomiting after your treatment, we encourage you to take your nausea medication as directed   If you develop nausea and vomiting that is not controlled by your nausea medication, call the clinic.   BELOW ARE SYMPTOMS THAT SHOULD BE REPORTED IMMEDIATELY:  *FEVER GREATER THAN 100.5 F  *CHILLS WITH OR WITHOUT FEVER  NAUSEA AND VOMITING THAT IS NOT CONTROLLED WITH YOUR NAUSEA MEDICATION  *UNUSUAL SHORTNESS OF BREATH  *UNUSUAL BRUISING OR BLEEDING  TENDERNESS IN MOUTH AND THROAT WITH OR WITHOUT PRESENCE OF ULCERS  *URINARY PROBLEMS  *BOWEL PROBLEMS  UNUSUAL RASH Items with * indicate a potential emergency and should be followed up as soon as possible.  Feel free to call the clinic should you have any questions or concerns. The clinic phone number is [phone].  Please show the CHEMO ALERT CARD at check-in to the Emergency Department and triage nurse.   

## 2017-05-01 ENCOUNTER — Ambulatory Visit: Payer: BLUE CROSS/BLUE SHIELD

## 2017-05-01 LAB — CANCER ANTIGEN 19-9: CA 19-9: 45 U/mL — ABNORMAL HIGH (ref 0–35)

## 2017-05-04 NOTE — Progress Notes (Signed)
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 Widespread hepatic metastasis, without abdominal primary identified. 2. Right twelfth rib metastasis. 3. Soft tissue fullness within the cecum, not entirely imaged. This could represent prominent stool or less likely a colon primary. Consider dedicated chest and pelvic CT or PET to evaluate for primary. 4. Suspicious right cardiophrenic angle adenopathy. Indeterminate/equivocal portal caval node. 5. Left adrenal adenoma. These results will be called to the ordering clinician or representative by the Radiologist Assistant, and communication documented in the PACS or zVision Dashboard.      12/24/2016 Procedure    Upper Endoscopy Nodular distal gastritis, biospied.      12/24/2016 Procedure    Colonoscopy  - The entire examined colon is normal on direct and retroflexion views. - No polyps or cancers.      12/24/2016 Pathology Results     Diagnosis Stomach, biopsy, distal - REACTIVE GASTROPATHY - NO MALIGNANCY IDENTIFIED      12/29/2016 Imaging    CT CAP w contrast  IMPRESSION: 1. No definite CT findings to suggest primary source of the apparent hepatic metastases. Gallbladder is  contracted and there is some wall irregularity in the body of the gallbladder but no gross gallbladder mass lesion is evident. No biliary dilatation. Previous MRI raise the question of the cecal lesion, but there is no evidence for gross cecal mass by CT today. There is some minimal fullness in the distal stomach, but this is a fairly typical appearance on CT and given the smooth appearance of the distal gastric wall and symmetric tissue fullness, neoplasm is felt to be not likely. 2. Hepatic metastases as seen on recent abdominal MRI. 3. Borderline to mild gastrohepatic, hepatoduodenal ligament, and retroperitoneal lymphadenopathy.  4. PET-CT may prove helpful to further evaluate.      01/01/2017 Imaging    US Biopsy  IMPRESSION: Status post ultrasound-guided biopsy of right liver tumor. Tissue specimen sent to pathology for complete histopathologic analysis.      01/01/2017 Procedure    Ultrasound-guided liver biopsy      01/01/2017 Pathology Results    Liver biopsy showed adenocarcinoma, consistent with cholangiocarcinoma.       01/01/2017 Initial Diagnosis    Cholangiocarcinoma metastatic to liver ([place])      01/27/2017 Imaging    NM PET scan IMPRESSION: Dominant geographic mass in the right hepatic lobe is heterogeneous but hypermetabolic with maximum SUV of 7.5. Likely metastatic hypermetabolic foci are present primarily in the right hepatic lobe but also involving the medial and lateral segments of the left hepatic lobe. Small lesion medially in the right twelfth rib subcostal space with very low metabolic activity with maximum SUV of 1.9, previously seen on the prior MRI of 12/17/2016. I am skeptical that this is a metastatic lesion. This  could well be a schwannoma or small vascular malformation. This does appear increased in size compared to 6/20 12/2012, and merits surveillance. Mild splenomegaly but without focal splenic hypermetabolic activity. 2.0 by 1.6 cm  hypodense right thyroid mass is mildly hypermetabolic. A significant minority of such lesions can turn out to represent thyroid cancer. Thyroid sonography is recommended for further characterization. Other imaging findings of potential clinical significance: Suspected cholelithiasis. Aortic Atherosclerosis (ICD10-I70.0).      02/12/2017 -  Chemotherapy    first line chemo cisplatin and gemcitabine. chemotherapy q week x 2 weeks with 1 week off starting 02/12/17      05/05/2017 Imaging    CT CAP W Contrast 05/05/17  IMPRESSION: 1. Large infiltrative primary tumor centered in the anterior right liver lobe is mildly decreased in size. Numerous small hepatic metastases are stable in size and demonstrate decreased capsular enhancement, suggesting treatment effect. 2. No new or progressive metastatic disease in the chest, abdomen or pelvis. 3. Stable indeterminate small soft tissue mass inferior to the medial right twelfth rib, which was non hypermetabolic on 22/08/5425 PET-CT, unlikely to be a metastasis. Schwannoma or small vascular malformation was suggested on 01/27/2017 PET-CT report. 4. Stable 1.6 cm right thyroid lobe nodule, which was hypermetabolic on 12/27/7626 PET-CT . Correlation with thyroid ultrasound may be performed as clinically warranted. 5. Chronic findings include: Cholelithiasis. Stable left adrenal adenoma. Chronic calcified thrombus in the left external iliac and left common femoral veins.       HISTORY OF PRESENTING ILLNESS (01/12/17):  Cheryl [name] 61 y.o. female is here because of newly diagnosed Cholangiocarcinoma. She was referred by her gastroenterologist Dr. Ardis Hughs. She presents to my clinic with her husband today.  She has had vague, intermittent abdominal pain, and a total of 10 pound weight loss since December. She was originally everted by her primary care physician, and lab test showed elevated liver function tests. She underwent abdominal MRI with and  without contrast on 12/17/2016, which showed wide spread hepatic metastasis, without abdominal primary identified. Soft tissue fullness within the cecum, indeterminate. She was referred to Dr. Ardis Hughs, and had negative colonoscopy and endoscopy on 12/24/2016. She underwent ultrasound guided liver biopsy on 01/01/2017, pathology showed adenocarcinoma, consistent with glandular carcinoma.   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   She reports some pain that started worsened days after her biopsy. She has nausea commonly. She is eating well. She reports fatigue and constipation and she takes [place] to help. Her bowel movements are about every 3 days. She denies chest pain, cough or SOB. She does mammograms normally with her most recent one being in June 2017.  She denies any history of cancer.   CURRENT THERAPY: first line chemo cisplatin and gemcitabine. chemotherapy q week x 2 weeks with 1 week off starting 02/12/17, gemcitabine dose reduction from 05/14/2017 due to severe thrombus cytopenia  INTERVAL HISTORY:   Cheryl [name] returns to the clinic today for follow up and following cycle 5 cisplatin and gemcitabine. She presents to the clinic today noting she has been doing well and has no pain or nausea. She was mildly fatigued for a day and the recovered. She has no neuropathy or bleeding anywhere. She takes Warfarin since 2010 for her lower extremity DVT. She plans to get her INR checked by her PCP.   Her daughter wants her to move to [place], [place] and she provided paperwork to send medical records to her new doctor. She plan  to move the end of November.  She feels overall better with Lexapro and she her constipation has improved.     MEDICAL HISTORY:  Past Medical History:  Diagnosis Date  . History of blood clots   . Liver mass   . PONV (postoperative nausea and vomiting)    nausea   SURGICAL HISTORY: Past Surgical History:  Procedure Laterality Date  . ABDOMINAL  HYSTERECTOMY     complete  . CESAREAN SECTION     x 1  . COLONOSCOPY WITH PROPOFOL N/A 12/24/2016   Procedure: COLONOSCOPY WITH PROPOFOL;  Surgeon: Milus Banister, MD;  Location: [facility];  Service: Endoscopy;  Laterality: N/A;  . ESOPHAGOGASTRODUODENOSCOPY (EGD) WITH PROPOFOL N/A 12/24/2016   Procedure: ESOPHAGOGASTRODUODENOSCOPY (EGD) WITH PROPOFOL;  Surgeon: Milus Banister, MD;  Location: [facility];  Service: Endoscopy;  Laterality: N/A;  . IR FLUORO GUIDE PORT INSERTION RIGHT  03/09/2017  . IR US GUIDE VASC ACCESS RIGHT  03/09/2017   SOCIAL HISTORY: Social History   Social History  . Marital status: Married    Spouse name: N/A  . Number of children: 1  . Years of education: N/A   Occupational History  . Not on file.   Social History Main Topics  . Smoking status: Former Smoker    Packs/day: 1.00    Years: 15.00    Types: Cigarettes  . Smokeless tobacco: Never Used     Comment: quit 2010  . Alcohol use No  . Drug use: No  . Sexual activity: Not on file   Other Topics Concern  . Not on file   Social History Narrative  . No narrative on file   FAMILY HISTORY: Family History  Problem Relation Age of Onset  . Stomach cancer Mother   . Emphysema Father    ALLERGIES:  is allergic to dilaudid [hydromorphone hcl]; sulfa antibiotics; and tramadol.  MEDICATIONS:  Current Outpatient Prescriptions  Medication Sig Dispense Refill  . acetaminophen (TYLENOL) 500 MG tablet Take 1,000 mg by mouth every 6 (six) hours as needed (for pain.).    Marland Kitchen escitalopram (LEXAPRO) 10 MG tablet Take 1 tablet (10 mg total) by mouth daily. 30 tablet 2  . lidocaine-prilocaine (EMLA) cream Apply 1 application topically as needed. 30 g 2  . ondansetron (ZOFRAN) 8 MG tablet Take 1 tablet (8 mg total) by mouth 2 (two) times daily as needed. Start on the third day after chemotherapy. 30 tablet 1  . prochlorperazine (COMPAZINE) 10 MG tablet Take 1 tablet (10 mg total) by mouth every 6 (six)  hours as needed (Nausea or vomiting). 30 tablet 1  . traMADol (ULTRAM) 50 MG tablet Take 1 tablet (50 mg total) by mouth every 6 (six) hours as needed. 30 tablet 1  . warfarin (COUMADIN) 4 MG tablet Take 4 mg by mouth daily at 6 PM.      No current facility-administered medications for this visit.     REVIEW OF SYSTEMS:  Constitutional: Denies fevers, chills or abnormal night sweats (+) fatigue Eyes: Denies blurriness of vision, double vision or watery eyes Ears, nose, mouth, throat, and face: Denies mucositis or sore throat Respiratory: Denies cough, dyspnea or wheezes Cardiovascular: Denies palpitation, chest discomfort or lower extremity swelling Gastrointestinal:  Denies heartburn or change in bowel habits, (+) nausea, controlled Skin: Denies abnormal skin rashes Lymphatics: Denies new lymphadenopathy or easy bruising Neurological:Denies numbness, tingling or new weaknesses Behavioral/Psych: (+) depression, controlled MSK: Denies arthralgias.  All other systems were reviewed with the patient  and are negative.  PHYSICAL EXAMINATION:  ECOG PERFORMANCE STATUS: 1 - Symptomatic but completely ambulatory Vitals:   05/07/17 0926  BP: 101/66  Pulse: 78  Resp: 18  Temp: (!) 97.5 F (36.4 C)  TempSrc: Oral  SpO2: 100%  Weight: 134 lb 12.8 oz (61.1 kg)  Height: 5' 8.5" (1.74 m)    GENERAL:alert, no distress and comfortable SKIN: skin color, texture, turgor are normal, no rashes or significant lesions EYES: normal, conjunctiva are pink and non-injected, sclera clear OROPHARYNX:no exudate, no erythema and lips, buccal mucosa, and tongue normal  NECK: supple, thyroid normal size, non-tender, without nodularity LYMPH:  no palpable lymphadenopathy in the cervical, axillary or inguinal LUNGS: clear to auscultation and percussion with normal breathing effort HEART: regular rate & rhythm and no murmurs and no lower extremity edema ABDOMEN:abdomen soft, non-tender and normal bowel sounds.  Enlarged liver to 4 cm and tender below the ribcage. Musculoskeletal:no cyanosis of digits and no clubbing  PSYCH: alert & oriented x 3 with fluent speech NEURO: no focal motor/sensory deficits  LABORATORY DATA:  I have reviewed the data as listed CBC Latest Ref Rng & Units 05/07/2017 04/30/2017 04/23/2017  WBC 3.9 - 10.3 10e3/uL 9.8 3.8(L) 4.5  Hemoglobin 11.6 - 15.9 g/dL 10.7(L) 11.1(L) 11.4(L)  Hematocrit 34.8 - 46.6 % 32.0(L) 33.9(L) 34.6(L)  Platelets 145 - 400 10e3/uL 24(L) 109(L) 221   CMP Latest Ref Rng & Units 05/07/2017 04/30/2017 04/23/2017  Glucose 70 - 140 mg/dl 93 84 86  BUN 7.0 - 26.0 mg/dL 16.5 19.1 14.1  Creatinine 0.6 - 1.1 mg/dL 0.7 0.7 0.7  Sodium 136 - 145 mEq/L 139 140 140  Potassium 3.5 - 5.1 mEq/L 4.1 4.2 4.1  Chloride 101 - 111 mmol/L - - -  CO2 22 - 29 mEq/L 24 25 24   Calcium 8.4 - 10.4 mg/dL 9.2 9.0 9.0  Total Protein 6.4 - 8.3 g/dL 6.9 6.9 6.9  Total Bilirubin 0.20 - 1.20 mg/dL 0.47 0.48 0.43  Alkaline Phos 40 - 150 U/L 179(H) 123 117  AST 5 - 34 U/L 27 37(H) 22  ALT 0 - 55 U/L 58(H) 51 15    CA 19.9:   01/22/17:  44. 02/12/17: 46 02/19/17: 44 03/05/17: 42 03/12/17: 42 03/26/17: 36 04/02/17: 40 04/23/17: 45 04/30/17: 45 05/07/17: PENDING     PATHOLOGY: Diagnosis 12/24/2016 Stomach, biopsy, distal - REACTIVE GASTROPATHY - NO MALIGNANCY IDENTIFIED  Diagnosis 01/01/2017 Liver, needle/core biopsy ADENOCARCINOMA, CONSISTENT WITH CHOLANGIOCARCINOMA.  RADIOGRAPHIC STUDIES: I have personally reviewed the radiological images as listed and agreed with the findings in the report. Ct Chest W Contrast  Result Date: 05/05/2017 CLINICAL DATA:  Intrahepatic cholangiocarcinoma of the right liver lobe with intrahepatic metastases, diagnosed 01/01/2017, presenting for restaging with ongoing chemotherapy. EXAM: CT CHEST, ABDOMEN, AND PELVIS WITH CONTRAST TECHNIQUE: Multidetector CT imaging of the chest, abdomen and pelvis was performed following the standard  protocol during bolus administration of intravenous contrast. CONTRAST:  158mL ISOVUE-300 IOPAMIDOL (ISOVUE-300) INJECTION 61% COMPARISON:  01/27/2017 PET-CT. 12/29/2016 CT chest, abdomen and pelvis. FINDINGS: CT CHEST FINDINGS Cardiovascular: Normal heart size. Stable trace anterior pericardial effusion/thickening. Right internal jugular MediPort terminates at the cavoatrial junction. Great vessels are normal in course and caliber. No central pulmonary emboli. Mediastinum/Nodes: Stable heterogeneous 1.6 cm right thyroid lobe nodule (series 2/image 4). Unremarkable esophagus. No pathologically enlarged axillary, mediastinal or hilar lymph nodes. Top-normal left supraclavicular nodes measuring up to 0.6 cm (series 2/ image 6), decreased from 0.9 cm on 12/29/2016. Lungs/Pleura: No pneumothorax. No  pleural effusion. Stable 3 mm solid subpleural right middle lobe pulmonary nodule (series 4/ image 100), more likely benign. Stable calcified 2 mm anterior left lower lobe granuloma. No acute consolidative airspace disease, lung masses or new significant pulmonary nodules. Musculoskeletal: No aggressive appearing focal osseous lesions. Mild thoracic spondylosis. CT ABDOMEN PELVIS FINDINGS Hepatobiliary: Dominant heterogeneous infiltrative liver mass centered in the anterior right liver lobe measures 11.9 x 5.3 cm (series 2/image 68), previously 12.7 x 5.5 cm on 12/29/2016 CT using similar measurement technique, mildly decreased. Numerous (greater than 10) hypodense liver masses scattered throughout the right greater the left liver lobes are not convincingly changed in size and appear to demonstrate decreased capsular enhancement. Representative lesions include a segment 6 right liver lobe 1.0 cm mass (series 2/ image 66), previously 1.0 cm, stable. Segment 8 right liver lobe 1.6 cm mass (series 2/ image 60), 1 previously 1.6 cm, stable. Liver dome 1.4 cm mass (series 2/ image 54), previously 1.4 cm, stable. No definite new  liver masses. Cholelithiasis, with no definite gallbladder wall thickening or pericholecystic fluid. No biliary ductal dilatation. Pancreas: Normal, with no mass or duct dilation. Spleen: Stable top normal size spleen.  No splenic mass. Adrenals/Urinary Tract: Stable 1.2 cm left adrenal nodule, characterized as a benign adenoma on 12/17/2016 MRI no new adrenal nodules. No hydronephrosis. Stable small right renal cysts, largest 1.2 cm in the upper right kidney, as characterized on 12/17/2016 MRI abdomen study. No new renal lesions. Normal bladder. Stomach/Bowel: Grossly normal stomach. Normal caliber small bowel with no small bowel wall thickening. Normal appendix. Normal large bowel with no diverticulosis, large bowel wall thickening or pericolonic fat stranding. Oral contrast transits to the rectum. Vascular/Lymphatic: Normal caliber abdominal aorta. Patent portal, splenic, hepatic and renal veins. Scattered calcifications are noted in the left external iliac and left common femoral vein, unchanged, compatible with chronic thrombosis. No pathologically enlarged lymph nodes in the abdomen or pelvis. Reproductive: Status post hysterectomy, with no abnormal findings at the vaginal cuff. No adnexal mass. Other: No pneumoperitoneum, ascites or focal fluid collection. Musculoskeletal: No aggressive appearing focal osseous lesions. Stable 1.3 cm mass inferior to the medial right twelfth rib (series 2/ image 54). Minimal lumbar spondylosis. IMPRESSION: 1. Large infiltrative primary tumor centered in the anterior right liver lobe is mildly decreased in size. Numerous small hepatic metastases are stable in size and demonstrate decreased capsular enhancement, suggesting treatment effect. 2. No new or progressive metastatic disease in the chest, abdomen or pelvis. 3. Stable indeterminate small soft tissue mass inferior to the medial right twelfth rib, which was non hypermetabolic on 56/21/3086 PET-CT, unlikely to be a  metastasis. Schwannoma or small vascular malformation was suggested on 01/27/2017 PET-CT report. 4. Stable 1.6 cm right thyroid lobe nodule, which was hypermetabolic on 57/84/6962 PET-CT . Correlation with thyroid ultrasound may be performed as clinically warranted. 5. Chronic findings include: Cholelithiasis. Stable left adrenal adenoma. Chronic calcified thrombus in the left external iliac and left common femoral veins. Electronically Signed   By: Ilona Sorrel M.D.   On: 05/05/2017 12:19   Ct Abdomen Pelvis W Contrast  Result Date: 05/05/2017 CLINICAL DATA:  Intrahepatic cholangiocarcinoma of the right liver lobe with intrahepatic metastases, diagnosed 01/01/2017, presenting for restaging with ongoing chemotherapy. EXAM: CT CHEST, ABDOMEN, AND PELVIS WITH CONTRAST TECHNIQUE: Multidetector CT imaging of the chest, abdomen and pelvis was performed following the standard protocol during bolus administration of intravenous contrast. CONTRAST:  183mL ISOVUE-300 IOPAMIDOL (ISOVUE-300) INJECTION 61% COMPARISON:  01/27/2017 PET-CT. 12/29/2016  CT chest, abdomen and pelvis. FINDINGS: CT CHEST FINDINGS Cardiovascular: Normal heart size. Stable trace anterior pericardial effusion/thickening. Right internal jugular MediPort terminates at the cavoatrial junction. Great vessels are normal in course and caliber. No central pulmonary emboli. Mediastinum/Nodes: Stable heterogeneous 1.6 cm right thyroid lobe nodule (series 2/image 4). Unremarkable esophagus. No pathologically enlarged axillary, mediastinal or hilar lymph nodes. Top-normal left supraclavicular nodes measuring up to 0.6 cm (series 2/ image 6), decreased from 0.9 cm on 12/29/2016. Lungs/Pleura: No pneumothorax. No pleural effusion. Stable 3 mm solid subpleural right middle lobe pulmonary nodule (series 4/ image 100), more likely benign. Stable calcified 2 mm anterior left lower lobe granuloma. No acute consolidative airspace disease, lung masses or new significant  pulmonary nodules. Musculoskeletal: No aggressive appearing focal osseous lesions. Mild thoracic spondylosis. CT ABDOMEN PELVIS FINDINGS Hepatobiliary: Dominant heterogeneous infiltrative liver mass centered in the anterior right liver lobe measures 11.9 x 5.3 cm (series 2/image 68), previously 12.7 x 5.5 cm on 12/29/2016 CT using similar measurement technique, mildly decreased. Numerous (greater than 10) hypodense liver masses scattered throughout the right greater the left liver lobes are not convincingly changed in size and appear to demonstrate decreased capsular enhancement. Representative lesions include a segment 6 right liver lobe 1.0 cm mass (series 2/ image 66), previously 1.0 cm, stable. Segment 8 right liver lobe 1.6 cm mass (series 2/ image 60), 1 previously 1.6 cm, stable. Liver dome 1.4 cm mass (series 2/ image 54), previously 1.4 cm, stable. No definite new liver masses. Cholelithiasis, with no definite gallbladder wall thickening or pericholecystic fluid. No biliary ductal dilatation. Pancreas: Normal, with no mass or duct dilation. Spleen: Stable top normal size spleen.  No splenic mass. Adrenals/Urinary Tract: Stable 1.2 cm left adrenal nodule, characterized as a benign adenoma on 12/17/2016 MRI no new adrenal nodules. No hydronephrosis. Stable small right renal cysts, largest 1.2 cm in the upper right kidney, as characterized on 12/17/2016 MRI abdomen study. No new renal lesions. Normal bladder. Stomach/Bowel: Grossly normal stomach. Normal caliber small bowel with no small bowel wall thickening. Normal appendix. Normal large bowel with no diverticulosis, large bowel wall thickening or pericolonic fat stranding. Oral contrast transits to the rectum. Vascular/Lymphatic: Normal caliber abdominal aorta. Patent portal, splenic, hepatic and renal veins. Scattered calcifications are noted in the left external iliac and left common femoral vein, unchanged, compatible with chronic thrombosis. No  pathologically enlarged lymph nodes in the abdomen or pelvis. Reproductive: Status post hysterectomy, with no abnormal findings at the vaginal cuff. No adnexal mass. Other: No pneumoperitoneum, ascites or focal fluid collection. Musculoskeletal: No aggressive appearing focal osseous lesions. Stable 1.3 cm mass inferior to the medial right twelfth rib (series 2/ image 54). Minimal lumbar spondylosis. IMPRESSION: 1. Large infiltrative primary tumor centered in the anterior right liver lobe is mildly decreased in size. Numerous small hepatic metastases are stable in size and demonstrate decreased capsular enhancement, suggesting treatment effect. 2. No new or progressive metastatic disease in the chest, abdomen or pelvis. 3. Stable indeterminate small soft tissue mass inferior to the medial right twelfth rib, which was non hypermetabolic on 73/71/0626 PET-CT, unlikely to be a metastasis. Schwannoma or small vascular malformation was suggested on 01/27/2017 PET-CT report. 4. Stable 1.6 cm right thyroid lobe nodule, which was hypermetabolic on 94/85/4627 PET-CT . Correlation with thyroid ultrasound may be performed as clinically warranted. 5. Chronic findings include: Cholelithiasis. Stable left adrenal adenoma. Chronic calcified thrombus in the left external iliac and left common femoral veins. Electronically Signed  By: Ilona Sorrel M.D.   On: 05/05/2017 12:19     CT CAP W Contrast 05/05/17  IMPRESSION: 1. Large infiltrative primary tumor centered in the anterior right liver lobe is mildly decreased in size. Numerous small hepatic metastases are stable in size and demonstrate decreased capsular enhancement, suggesting treatment effect. 2. No new or progressive metastatic disease in the chest, abdomen or pelvis. 3. Stable indeterminate small soft tissue mass inferior to the medial right twelfth rib, which was non hypermetabolic on 78/29/5621 PET-CT, unlikely to be a metastasis. Schwannoma or small  vascular malformation was suggested on 01/27/2017 PET-CT report. 4. Stable 1.6 cm right thyroid lobe nodule, which was hypermetabolic on 30/86/5784 PET-CT . Correlation with thyroid ultrasound may be performed as clinically warranted. 5. Chronic findings include: Cholelithiasis. Stable left adrenal adenoma. Chronic calcified thrombus in the left external iliac and left common femoral veins.    ASSESSMENT & PLAN: Cheryl [name] is a  61 y.o. female, with past medical history of DVT on Coumadin, presented with intermittent abdominal pain, and 10 pound weight loss.  1. 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 -Her liver biopsy confirmed adenocarcinoma, consistent with cholangiocarcinoma. Her EGD and endoscopy was negative for primary tumor. CT chest, abdomen and pelvis was negative for other primary or metastasis. -Baseline CA19.9 was elevated at 44 on 01/22/17 -Her case was previously reviewed in our GI tumor Board, the T12 metastasis reported by MRI was not highly suspicious for bone metastasis, after we reviewed in the tumor board. -I reviewed her PET scan images with patient in person, unfortunately he has extensive liver metastasis, including metastatic lesion in the left lobe, no other distant metastasis. -I have discussed with Surgeon Dr. Barry Dienes who doesn't think that surgery resection is feasible due to left lobe liver metastasis.  -I recommend her to start his systemic chemotherapy to control her cancer. The goal of therapy is palliative, to prolong her life and reserve quality of life. 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  -she has started first line chemo cisplatin and gemcitabine on  02/12/17, tolerating well so far -Pt plans to move to [facility] [place] after Thanksgiving, I will send referral to Med Onc in [place].  -We reviewed her restaging CT from 05/05/17 shows the primary tumor has mildly decreased in size, evident of good response to chemotherapy.  -Labs reviewed, she is slightly anemic with Hg 10.7 and her ANC improved to 7.5 after onpro patch.  Her PLT are 24K, I discussed her low platelet count can cause bleeding. I suggest she stops Coumadin or blood thinner until her blood counts recover. Will repeat lab on Monday, 11/5. If needed we will do a platelet transfusion. I suggest she contact her PCP to hold her INR another week.  -Will reduce chemo dose starting with cycle 5 on 05/14/17 due to her low platelets   -f/u on 11/9    2. Constipation - I encouraged her to drink more water or try prune juice, uses stool softener and laxative as needed. -Improved and manageable   3. Right flank pain - She has been trying Tylenol once daily - I will give her a prescription of Tramadol. She should try to eat before taking to avoid nausea.  4. Nausea - I will  prescribe compazine for nausea.  - Patient still has a good appetite.  -Nausea is mild and manageable   5. Depression  -Was previously on Zoloft, stopped this after one month d/t it not helping her.  -I started her on 10mg  Lexapro daily  -Pt reports to doing much better on Lexapro, will continue  -Pt does not think she was on Lexapro for long enough but discontinued it on her own after 1 month. She still has some anxiety and depression, she will restart Lexapro if after 6-8 weeks if she does not notice improvement in her mood or has side effects, consider dosage or medication change. -She feels better with Lexapro, depression controlled   6. Mild anemia -Secondary to chemotherapy, overall very mild, stable, continue observation -Consider blood transfusion if hemoglobin less than 8 -today 05/07/17 Hg is 10.2, no need for  blood transfusion   7. Goal of care discussion  -We again discussed the incurable nature of her cancer, and the overall poor prognosis, especially if she does not have good response to chemotherapy or progress on chemo -The patient understands the goal of care is palliative. -I recommend DNR/DNI, she will think about it    PLAN -scan reviewed, partial response. Labs reviewed and her PLT are 24K, Hold Coumadin, risk of bleeding discussed  -Send referral to [place] [facility], she will relocate at the end of this month  -Lab, flush and chemo cisplatin and gemcitabine 11/9, with gemcitabine dose reduction  -Lab on 11/5 to see if her thrombus cytopenia improves,  platelet transfusion if plt<20K -F/u with me or lacie on 11/9     No orders of the defined types were placed in this encounter.  All questions were answered. The patient knows to call the clinic with any problems, questions or concerns. I spent 25 minutes counseling the patient face to face. The total time spent in the appointment was 30 minutes and more than 50% was on counseling.  This document serves as a record of services personally performed by Truitt Merle, MD. It was created on her behalf by Joslyn Devon, a trained medical scribe. The creation of this record is based on the scribe's personal observations and the provider's statements to them. This document has been checked and approved by the attending provider.     Truitt Merle, MD 05/07/2017

## 2017-05-05 ENCOUNTER — Ambulatory Visit (HOSPITAL_COMMUNITY)
Admission: RE | Admit: 2017-05-05 | Discharge: 2017-05-05 | Disposition: A | Discharge status: Home / Self Care | Payer: BLUE CROSS/BLUE SHIELD | Source: Ambulatory Visit | Attending: Hematology | Admitting: Hematology

## 2017-05-05 ENCOUNTER — Encounter (HOSPITAL_COMMUNITY): Payer: Self-pay

## 2017-05-05 DIAGNOSIS — C221 Intrahepatic bile duct carcinoma: Secondary | ICD-10-CM

## 2017-05-05 DIAGNOSIS — I82412 Acute embolism and thrombosis of left femoral vein: Secondary | ICD-10-CM | POA: Insufficient documentation

## 2017-05-05 DIAGNOSIS — D490 Neoplasm of unspecified behavior of digestive system: Secondary | ICD-10-CM | POA: Diagnosis not present

## 2017-05-05 DIAGNOSIS — M7989 Other specified soft tissue disorders: Secondary | ICD-10-CM | POA: Insufficient documentation

## 2017-05-05 DIAGNOSIS — C787 Secondary malignant neoplasm of liver and intrahepatic bile duct: Secondary | ICD-10-CM | POA: Diagnosis not present

## 2017-05-05 DIAGNOSIS — E041 Nontoxic single thyroid nodule: Secondary | ICD-10-CM | POA: Diagnosis not present

## 2017-05-05 DIAGNOSIS — K8020 Calculus of gallbladder without cholecystitis without obstruction: Secondary | ICD-10-CM | POA: Insufficient documentation

## 2017-05-05 DIAGNOSIS — I82522 Chronic embolism and thrombosis of left iliac vein: Secondary | ICD-10-CM | POA: Insufficient documentation

## 2017-05-05 DIAGNOSIS — D3502 Benign neoplasm of left adrenal gland: Secondary | ICD-10-CM | POA: Insufficient documentation

## 2017-05-05 MED ORDER — HEPARIN SOD (PORK) LOCK FLUSH 100 UNIT/ML IV SOLN
500.0000 [IU] | Freq: Once | INTRAVENOUS | Status: AC
  Administered 2017-05-05: 500 [IU] via INTRAVENOUS

## 2017-05-05 MED ORDER — IOPAMIDOL (ISOVUE-300) INJECTION 61%
INTRAVENOUS | Status: AC
  Administered 2017-05-05: 100 mL via INTRAVENOUS
  Filled 2017-05-05: qty 100

## 2017-05-05 MED ORDER — HEPARIN SOD (PORK) LOCK FLUSH 100 UNIT/ML IV SOLN
INTRAVENOUS | Status: AC
  Filled 2017-05-05: qty 5

## 2017-05-07 ENCOUNTER — Ambulatory Visit (HOSPITAL_BASED_OUTPATIENT_CLINIC_OR_DEPARTMENT_OTHER): Payer: BLUE CROSS/BLUE SHIELD | Admitting: Hematology

## 2017-05-07 ENCOUNTER — Encounter: Payer: Self-pay | Admitting: Hematology

## 2017-05-07 ENCOUNTER — Ambulatory Visit (HOSPITAL_BASED_OUTPATIENT_CLINIC_OR_DEPARTMENT_OTHER): Payer: BLUE CROSS/BLUE SHIELD

## 2017-05-07 ENCOUNTER — Telehealth: Payer: Self-pay

## 2017-05-07 ENCOUNTER — Other Ambulatory Visit (HOSPITAL_BASED_OUTPATIENT_CLINIC_OR_DEPARTMENT_OTHER): Payer: BLUE CROSS/BLUE SHIELD

## 2017-05-07 ENCOUNTER — Ambulatory Visit: Payer: BLUE CROSS/BLUE SHIELD

## 2017-05-07 VITALS — BP 101/66 | HR 78 | Temp 97.5°F | Resp 18 | Ht 68.5 in | Wt 134.8 lb

## 2017-05-07 DIAGNOSIS — Z7901 Long term (current) use of anticoagulants: Secondary | ICD-10-CM | POA: Diagnosis not present

## 2017-05-07 DIAGNOSIS — D6481 Anemia due to antineoplastic chemotherapy: Secondary | ICD-10-CM | POA: Diagnosis not present

## 2017-05-07 DIAGNOSIS — C787 Secondary malignant neoplasm of liver and intrahepatic bile duct: Secondary | ICD-10-CM | POA: Diagnosis not present

## 2017-05-07 DIAGNOSIS — Z95828 Presence of other vascular implants and grafts: Secondary | ICD-10-CM

## 2017-05-07 DIAGNOSIS — C221 Intrahepatic bile duct carcinoma: Secondary | ICD-10-CM

## 2017-05-07 DIAGNOSIS — K5900 Constipation, unspecified: Secondary | ICD-10-CM

## 2017-05-07 DIAGNOSIS — F329 Major depressive disorder, single episode, unspecified: Secondary | ICD-10-CM

## 2017-05-07 DIAGNOSIS — Z86718 Personal history of other venous thrombosis and embolism: Secondary | ICD-10-CM

## 2017-05-07 DIAGNOSIS — R110 Nausea: Secondary | ICD-10-CM | POA: Diagnosis not present

## 2017-05-07 DIAGNOSIS — R109 Unspecified abdominal pain: Secondary | ICD-10-CM | POA: Diagnosis not present

## 2017-05-07 LAB — CBC WITH DIFFERENTIAL/PLATELET
BASO%: 0.2 % (ref 0.0–2.0)
BASOS ABS: 0 10*3/uL (ref 0.0–0.1)
EOS ABS: 0 10*3/uL (ref 0.0–0.5)
EOS%: 0.4 % (ref 0.0–7.0)
HEMATOCRIT: 32 % — AB (ref 34.8–46.6)
HEMOGLOBIN: 10.7 g/dL — AB (ref 11.6–15.9)
LYMPH%: 18.4 % (ref 14.0–49.7)
MCH: 31 pg (ref 25.1–34.0)
MCHC: 33.4 g/dL (ref 31.5–36.0)
MCV: 92.8 fL (ref 79.5–101.0)
MONO#: 0.4 10*3/uL (ref 0.1–0.9)
MONO%: 4.1 % (ref 0.0–14.0)
NEUT%: 76.9 % — AB (ref 38.4–76.8)
NEUTROS ABS: 7.5 10*3/uL — AB (ref 1.5–6.5)
PLATELETS: 24 10*3/uL — AB (ref 145–400)
RBC: 3.45 10*6/uL — ABNORMAL LOW (ref 3.70–5.45)
RDW: 16.7 % — ABNORMAL HIGH (ref 11.2–14.5)
WBC: 9.8 10*3/uL (ref 3.9–10.3)
lymph#: 1.8 10*3/uL (ref 0.9–3.3)

## 2017-05-07 LAB — COMPREHENSIVE METABOLIC PANEL
ALBUMIN: 3.5 g/dL (ref 3.5–5.0)
ALT: 58 U/L — AB (ref 0–55)
ANION GAP: 8 meq/L (ref 3–11)
AST: 27 U/L (ref 5–34)
Alkaline Phosphatase: 179 U/L — ABNORMAL HIGH (ref 40–150)
BILIRUBIN TOTAL: 0.47 mg/dL (ref 0.20–1.20)
BUN: 16.5 mg/dL (ref 7.0–26.0)
CALCIUM: 9.2 mg/dL (ref 8.4–10.4)
CHLORIDE: 108 meq/L (ref 98–109)
CO2: 24 mEq/L (ref 22–29)
CREATININE: 0.7 mg/dL (ref 0.6–1.1)
EGFR: >60 mL/min/{1.73_m2} (ref >60)
Glucose: 93 mg/dl (ref 70–140)
Potassium: 4.1 mEq/L (ref 3.5–5.1)
Sodium: 139 mEq/L (ref 136–145)
TOTAL PROTEIN: 6.9 g/dL (ref 6.4–8.3)

## 2017-05-07 MED ORDER — HEPARIN SOD (PORK) LOCK FLUSH 100 UNIT/ML IV SOLN
500.0000 [IU] | Freq: Once | INTRAVENOUS | Status: AC | PRN
  Administered 2017-05-07: 500 [IU] via INTRAVENOUS
  Filled 2017-05-07: qty 5

## 2017-05-07 MED ORDER — SODIUM CHLORIDE 0.9% FLUSH
10.0000 mL | INTRAVENOUS | Status: DC | PRN
  Administered 2017-05-07: 10 mL via INTRAVENOUS
  Filled 2017-05-07: qty 10

## 2017-05-07 NOTE — Telephone Encounter (Signed)
Scheduled patient for upcoming appointment. Waiting for to schedule Md appointmet per approval slots. Per 11/2 los

## 2017-05-07 NOTE — Addendum Note (Signed)
Addended by: Jesse Fall on: 05/07/2017 05:31 PM   Modules accepted: Orders

## 2017-05-08 LAB — CANCER ANTIGEN 19-9: CAN 19-9: 46 U/mL — AB (ref 0–35)

## 2017-05-10 ENCOUNTER — Other Ambulatory Visit (HOSPITAL_BASED_OUTPATIENT_CLINIC_OR_DEPARTMENT_OTHER): Payer: BLUE CROSS/BLUE SHIELD

## 2017-05-10 ENCOUNTER — Other Ambulatory Visit: Payer: BLUE CROSS/BLUE SHIELD

## 2017-05-10 DIAGNOSIS — C221 Intrahepatic bile duct carcinoma: Secondary | ICD-10-CM | POA: Diagnosis not present

## 2017-05-10 DIAGNOSIS — C787 Secondary malignant neoplasm of liver and intrahepatic bile duct: Secondary | ICD-10-CM

## 2017-05-10 LAB — COMPREHENSIVE METABOLIC PANEL
ALT: 40 U/L (ref 0–55)
AST: 25 U/L (ref 5–34)
Albumin: 3.8 g/dL (ref 3.5–5.0)
Alkaline Phosphatase: 172 U/L — ABNORMAL HIGH (ref 40–150)
Anion Gap: 7 mEq/L (ref 3–11)
BUN: 15.9 mg/dL (ref 7.0–26.0)
CHLORIDE: 105 meq/L (ref 98–109)
CO2: 26 meq/L (ref 22–29)
CREATININE: 0.7 mg/dL (ref 0.6–1.1)
Calcium: 9.3 mg/dL (ref 8.4–10.4)
EGFR: >60 mL/min/{1.73_m2} (ref >60)
GLUCOSE: 78 mg/dL (ref 70–140)
POTASSIUM: 4.1 meq/L (ref 3.5–5.1)
SODIUM: 138 meq/L (ref 136–145)
Total Bilirubin: 0.51 mg/dL (ref 0.20–1.20)
Total Protein: 7.3 g/dL (ref 6.4–8.3)

## 2017-05-10 LAB — CBC WITH DIFFERENTIAL/PLATELET
BASO%: 0.2 % (ref 0.0–2.0)
BASOS ABS: 0 10*3/uL (ref 0.0–0.1)
EOS ABS: 0.1 10*3/uL (ref 0.0–0.5)
EOS%: 0.7 % (ref 0.0–7.0)
HCT: 33.5 % — ABNORMAL LOW (ref 34.8–46.6)
HGB: 11 g/dL — ABNORMAL LOW (ref 11.6–15.9)
LYMPH%: 20.8 % (ref 14.0–49.7)
MCH: 30.9 pg (ref 25.1–34.0)
MCHC: 32.8 g/dL (ref 31.5–36.0)
MCV: 94.1 fL (ref 79.5–101.0)
MONO#: 0.6 10*3/uL (ref 0.1–0.9)
MONO%: 5.7 % (ref 0.0–14.0)
NEUT%: 72.6 % (ref 38.4–76.8)
NEUTROS ABS: 7.8 10*3/uL — AB (ref 1.5–6.5)
PLATELETS: 38 10*3/uL — AB (ref 145–400)
RBC: 3.56 10*6/uL — AB (ref 3.70–5.45)
RDW: 16.7 % — ABNORMAL HIGH (ref 11.2–14.5)
WBC: 10.7 10*3/uL — AB (ref 3.9–10.3)
lymph#: 2.2 10*3/uL (ref 0.9–3.3)

## 2017-05-11 LAB — CANCER ANTIGEN 19-9: CAN 19-9: 42 U/mL — AB (ref 0–35)

## 2017-05-12 ENCOUNTER — Telehealth: Payer: Self-pay | Admitting: Hematology

## 2017-05-12 NOTE — Telephone Encounter (Signed)
FAXED RECORDS TO DR Nicole Kindred [phone]

## 2017-05-13 NOTE — Progress Notes (Signed)
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 Widespread hepatic metastasis, without abdominal primary identified. 2. Right twelfth rib metastasis. 3. Soft tissue fullness within the cecum, not entirely imaged. This could represent prominent stool or less likely a colon primary. Consider dedicated chest and pelvic CT or PET to evaluate for primary. 4. Suspicious right cardiophrenic angle adenopathy. Indeterminate/equivocal portal caval node. 5. Left adrenal adenoma. These results will be called to the ordering clinician or representative by the Radiologist Assistant, and communication documented in the PACS or zVision Dashboard.      12/24/2016 Procedure    Upper Endoscopy Nodular distal gastritis, biospied.      12/24/2016 Procedure    Colonoscopy  - The entire examined colon is normal on direct and retroflexion views. - No polyps or cancers.      12/24/2016 Pathology Results     Diagnosis Stomach, biopsy, distal - REACTIVE GASTROPATHY - NO MALIGNANCY IDENTIFIED      12/29/2016 Imaging    CT CAP w contrast  IMPRESSION: 1. No definite CT findings to suggest primary source of the apparent hepatic metastases. Gallbladder is  contracted and there is some wall irregularity in the body of the gallbladder but no gross gallbladder mass lesion is evident. No biliary dilatation. Previous MRI raise the question of the cecal lesion, but there is no evidence for gross cecal mass by CT today. There is some minimal fullness in the distal stomach, but this is a fairly typical appearance on CT and given the smooth appearance of the distal gastric wall and symmetric tissue fullness, neoplasm is felt to be not likely. 2. Hepatic metastases as seen on recent abdominal MRI. 3. Borderline to mild gastrohepatic, hepatoduodenal ligament, and retroperitoneal lymphadenopathy.  4. PET-CT may prove helpful to further evaluate.      01/01/2017 Imaging    US Biopsy  IMPRESSION: Status post ultrasound-guided biopsy of right liver tumor. Tissue specimen sent to pathology for complete histopathologic analysis.      01/01/2017 Procedure    Ultrasound-guided liver biopsy      01/01/2017 Pathology Results    Liver biopsy showed adenocarcinoma, consistent with cholangiocarcinoma.       01/01/2017 Initial Diagnosis    Cholangiocarcinoma metastatic to liver ([place])      01/27/2017 Imaging    NM PET scan IMPRESSION: Dominant geographic mass in the right hepatic lobe is heterogeneous but hypermetabolic with maximum SUV of 7.5. Likely metastatic hypermetabolic foci are present primarily in the right hepatic lobe but also involving the medial and lateral segments of the left hepatic lobe. Small lesion medially in the right twelfth rib subcostal space with very low metabolic activity with maximum SUV of 1.9, previously seen on the prior MRI of 12/17/2016. I am skeptical that this is a metastatic lesion. This  could well be a schwannoma or small vascular malformation. This does appear increased in size compared to 6/20 12/2012, and merits surveillance. Mild splenomegaly but without focal splenic hypermetabolic activity. 2.0 by 1.6 cm  hypodense right thyroid mass is mildly hypermetabolic. A significant minority of such lesions can turn out to represent thyroid cancer. Thyroid sonography is recommended for further characterization. Other imaging findings of potential clinical significance: Suspected cholelithiasis. Aortic Atherosclerosis (ICD10-I70.0).      02/12/2017 -  Chemotherapy    first line chemo cisplatin and gemcitabine. chemotherapy q week x 2 weeks with 1 week off starting 02/12/17      05/05/2017 Imaging    CT CAP W Contrast 05/05/17  IMPRESSION: 1. Large infiltrative primary tumor centered in the anterior right liver lobe is mildly decreased in size. Numerous small hepatic metastases are stable in size and demonstrate decreased capsular enhancement, suggesting treatment effect. 2. No new or progressive metastatic disease in the chest, abdomen or pelvis. 3. Stable indeterminate small soft tissue mass inferior to the medial right twelfth rib, which was non hypermetabolic on 98/92/1194 PET-CT, unlikely to be a metastasis. Schwannoma or small vascular malformation was suggested on 01/27/2017 PET-CT report. 4. Stable 1.6 cm right thyroid lobe nodule, which was hypermetabolic on 17/40/8144 PET-CT . Correlation with thyroid ultrasound may be performed as clinically warranted. 5. Chronic findings include: Cholelithiasis. Stable left adrenal adenoma. Chronic calcified thrombus in the left external iliac and left common femoral veins.       HISTORY OF PRESENTING ILLNESS (01/12/17):  Cheryl [name] 61 y.o. female is here because of newly diagnosed Cholangiocarcinoma. She was referred by her gastroenterologist Dr. Ardis Hughs. She presents to my clinic with her husband today.  She has had vague, intermittent abdominal pain, and a total of 10 pound weight loss since December. She was originally everted by her primary care physician, and lab test showed elevated liver function tests. She underwent abdominal MRI with and  without contrast on 12/17/2016, which showed wide spread hepatic metastasis, without abdominal primary identified. Soft tissue fullness within the cecum, indeterminate. She was referred to Dr. Ardis Hughs, and had negative colonoscopy and endoscopy on 12/24/2016. She underwent ultrasound guided liver biopsy on 01/01/2017, pathology showed adenocarcinoma, consistent with glandular carcinoma.   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   She reports some pain that started worsened days after her biopsy. She has nausea commonly. She is eating well. She reports fatigue and constipation and she takes [place] to help. Her bowel movements are about every 3 days. She denies chest pain, cough or SOB. She does mammograms normally with her most recent one being in June 2017.  She denies any history of cancer.   CURRENT THERAPY: first line chemo cisplatin and gemcitabine. chemotherapy q week x 2 weeks with 1 week off starting 02/12/17, gemcitabine dose reduction from 05/07/2017 due to severe thrombocytopenia    INTERVAL HISTORY:   Cheryl [name] returns to the clinic today for follow up. She presents in the infusion room. She is feeling better. She denies any bleeding and is ready to proceed with treatment.      MEDICAL HISTORY:  Past Medical History:  Diagnosis Date  . History of blood clots   . Liver mass   . PONV (postoperative nausea and vomiting)    nausea   SURGICAL HISTORY: Past Surgical History:  Procedure Laterality Date  . ABDOMINAL HYSTERECTOMY     complete  . CESAREAN SECTION  x 1  . IR FLUORO GUIDE PORT INSERTION RIGHT  03/09/2017  . IR US GUIDE VASC ACCESS RIGHT  03/09/2017   SOCIAL HISTORY: Social History   Socioeconomic History  . Marital status: Married    Spouse name: Not on file  . Number of children: 1  . Years of education: Not on file  . Highest education level: Not on file  Social Needs  . Financial resource strain: Not on file  . Food insecurity -  worry: Not on file  . Food insecurity - inability: Not on file  . Transportation needs - medical: Not on file  . Transportation needs - non-medical: Not on file  Occupational History  . Not on file  Tobacco Use  . Smoking status: Former Smoker    Packs/day: 1.00    Years: 15.00    Pack years: 15.00    Types: Cigarettes  . Smokeless tobacco: Never Used  . Tobacco comment: quit 2010  Substance and Sexual Activity  . Alcohol use: No  . Drug use: No  . Sexual activity: Not on file  Other Topics Concern  . Not on file  Social History Narrative  . Not on file   FAMILY HISTORY: Family History  Problem Relation Age of Onset  . Stomach cancer Mother   . Emphysema Father    ALLERGIES:  is allergic to dilaudid [hydromorphone hcl]; sulfa antibiotics; and tramadol.  MEDICATIONS:  Current Outpatient Medications  Medication Sig Dispense Refill  . acetaminophen (TYLENOL) 500 MG tablet Take 1,000 mg by mouth every 6 (six) hours as needed (for pain.).    Marland Kitchen escitalopram (LEXAPRO) 10 MG tablet Take 1 tablet (10 mg total) by mouth daily. 30 tablet 2  . lidocaine-prilocaine (EMLA) cream Apply 1 application topically as needed. 30 g 2  . ondansetron (ZOFRAN) 8 MG tablet Take 1 tablet (8 mg total) by mouth 2 (two) times daily as needed. Start on the third day after chemotherapy. 30 tablet 1  . prochlorperazine (COMPAZINE) 10 MG tablet Take 1 tablet (10 mg total) by mouth every 6 (six) hours as needed (Nausea or vomiting). 30 tablet 1  . traMADol (ULTRAM) 50 MG tablet Take 1 tablet (50 mg total) by mouth every 6 (six) hours as needed. 30 tablet 1  . warfarin (COUMADIN) 4 MG tablet Take 4 mg by mouth daily at 6 PM.      No current facility-administered medications for this visit.     REVIEW OF SYSTEMS:  Constitutional: Denies fevers, chills or abnormal night sweats (+) fatigue Eyes: Denies blurriness of vision, double vision or watery eyes Ears, nose, mouth, throat, and face: Denies mucositis  or sore throat Respiratory: Denies cough, dyspnea or wheezes Cardiovascular: Denies palpitation, chest discomfort or lower extremity swelling Gastrointestinal:  Denies heartburn or change in bowel habits, (+) nausea, controlled Skin: Denies abnormal skin rashes Lymphatics: Denies new lymphadenopathy or easy bruising Neurological:Denies numbness, tingling or new weaknesses Behavioral/Psych: (+) depression, controlled MSK: Denies arthralgias.  All other systems were reviewed with the patient and are negative.  PHYSICAL EXAMINATION:  ECOG PERFORMANCE STATUS: 1 - Symptomatic but completely ambulatory Blood pressure 118/60, heart rate 76, respiratory rate 18, temperature 36.8, pulse ox 100% on room air GENERAL:alert, no distress and comfortable SKIN: skin color, texture, turgor are normal, no rashes or significant lesions EYES: normal, conjunctiva are pink and non-injected, sclera clear OROPHARYNX:no exudate, no erythema and lips, buccal mucosa, and tongue normal  NECK: supple, thyroid normal size, non-tender, without nodularity LYMPH:  no palpable lymphadenopathy in the cervical, axillary or inguinal LUNGS: clear to auscultation and percussion with normal breathing effort HEART: regular rate & rhythm and no murmurs and no lower extremity edema ABDOMEN:abdomen soft, non-tender and normal bowel sounds. Enlarged liver to 4 cm and tender below the ribcage. Musculoskeletal:no cyanosis of digits and no clubbing  PSYCH: alert & oriented x 3 with fluent speech NEURO: no focal motor/sensory deficits  LABORATORY DATA:  I have reviewed the data as listed CBC Latest Ref Rng & Units 05/14/2017 05/10/2017 05/07/2017  WBC 3.9 - 10.3 10e3/uL 7.4 10.7(H) 9.8  Hemoglobin 11.6 - 15.9 g/dL 10.6(L) 11.0(L) 10.7(L)  Hematocrit 34.8 - 46.6 % 31.9(L) 33.5(L) 32.0(L)  Platelets 145 - 400 10e3/uL 203 38(L) 24(L)   CMP Latest Ref Rng & Units 05/14/2017 05/10/2017 05/07/2017  Glucose 70 - 140 mg/dl 83 78 93  BUN 7.0  - 26.0 mg/dL 14.1 15.9 16.5  Creatinine 0.6 - 1.1 mg/dL 0.7 0.7 0.7  Sodium 136 - 145 mEq/L 140 138 139  Potassium 3.5 - 5.1 mEq/L 4.2 4.1 4.1  Chloride 101 - 111 mmol/L - - -  CO2 22 - 29 mEq/L 25 26 24   Calcium 8.4 - 10.4 mg/dL 9.1 9.3 9.2  Total Protein 6.4 - 8.3 g/dL 6.8 7.3 6.9  Total Bilirubin 0.20 - 1.20 mg/dL 0.47 0.51 0.47  Alkaline Phos 40 - 150 U/L 144 172(H) 179(H)  AST 5 - 34 U/L 23 25 27   ALT 0 - 55 U/L 24 40 58(H)    CA 19.9:   01/22/17:  44. 02/12/17: 46 02/19/17: 44 03/05/17: 42 03/12/17: 42 03/26/17: 36 04/02/17: 40 04/23/17: 45 04/30/17: 45 05/07/17: 46 05/10/17: 42    PATHOLOGY: Diagnosis 12/24/2016 Stomach, biopsy, distal - REACTIVE GASTROPATHY - NO MALIGNANCY IDENTIFIED  Diagnosis 01/01/2017 Liver, needle/core biopsy ADENOCARCINOMA, CONSISTENT WITH CHOLANGIOCARCINOMA.  RADIOGRAPHIC STUDIES: I have personally reviewed the radiological images as listed and agreed with the findings in the report. Ct Chest W Contrast  Result Date: 05/05/2017 CLINICAL DATA:  Intrahepatic cholangiocarcinoma of the right liver lobe with intrahepatic metastases, diagnosed 01/01/2017, presenting for restaging with ongoing chemotherapy. EXAM: CT CHEST, ABDOMEN, AND PELVIS WITH CONTRAST TECHNIQUE: Multidetector CT imaging of the chest, abdomen and pelvis was performed following the standard protocol during bolus administration of intravenous contrast. CONTRAST:  173mL ISOVUE-300 IOPAMIDOL (ISOVUE-300) INJECTION 61% COMPARISON:  01/27/2017 PET-CT. 12/29/2016 CT chest, abdomen and pelvis. FINDINGS: CT CHEST FINDINGS Cardiovascular: Normal heart size. Stable trace anterior pericardial effusion/thickening. Right internal jugular MediPort terminates at the cavoatrial junction. Great vessels are normal in course and caliber. No central pulmonary emboli. Mediastinum/Nodes: Stable heterogeneous 1.6 cm right thyroid lobe nodule (series 2/image 4). Unremarkable esophagus. No pathologically enlarged  axillary, mediastinal or hilar lymph nodes. Top-normal left supraclavicular nodes measuring up to 0.6 cm (series 2/ image 6), decreased from 0.9 cm on 12/29/2016. Lungs/Pleura: No pneumothorax. No pleural effusion. Stable 3 mm solid subpleural right middle lobe pulmonary nodule (series 4/ image 100), more likely benign. Stable calcified 2 mm anterior left lower lobe granuloma. No acute consolidative airspace disease, lung masses or new significant pulmonary nodules. Musculoskeletal: No aggressive appearing focal osseous lesions. Mild thoracic spondylosis. CT ABDOMEN PELVIS FINDINGS Hepatobiliary: Dominant heterogeneous infiltrative liver mass centered in the anterior right liver lobe measures 11.9 x 5.3 cm (series 2/image 68), previously 12.7 x 5.5 cm on 12/29/2016 CT using similar measurement technique, mildly decreased. Numerous (greater than 10) hypodense liver masses scattered throughout the right greater the left liver lobes are not  convincingly changed in size and appear to demonstrate decreased capsular enhancement. Representative lesions include a segment 6 right liver lobe 1.0 cm mass (series 2/ image 66), previously 1.0 cm, stable. Segment 8 right liver lobe 1.6 cm mass (series 2/ image 60), 1 previously 1.6 cm, stable. Liver dome 1.4 cm mass (series 2/ image 54), previously 1.4 cm, stable. No definite new liver masses. Cholelithiasis, with no definite gallbladder wall thickening or pericholecystic fluid. No biliary ductal dilatation. Pancreas: Normal, with no mass or duct dilation. Spleen: Stable top normal size spleen.  No splenic mass. Adrenals/Urinary Tract: Stable 1.2 cm left adrenal nodule, characterized as a benign adenoma on 12/17/2016 MRI no new adrenal nodules. No hydronephrosis. Stable small right renal cysts, largest 1.2 cm in the upper right kidney, as characterized on 12/17/2016 MRI abdomen study. No new renal lesions. Normal bladder. Stomach/Bowel: Grossly normal stomach. Normal caliber  small bowel with no small bowel wall thickening. Normal appendix. Normal large bowel with no diverticulosis, large bowel wall thickening or pericolonic fat stranding. Oral contrast transits to the rectum. Vascular/Lymphatic: Normal caliber abdominal aorta. Patent portal, splenic, hepatic and renal veins. Scattered calcifications are noted in the left external iliac and left common femoral vein, unchanged, compatible with chronic thrombosis. No pathologically enlarged lymph nodes in the abdomen or pelvis. Reproductive: Status post hysterectomy, with no abnormal findings at the vaginal cuff. No adnexal mass. Other: No pneumoperitoneum, ascites or focal fluid collection. Musculoskeletal: No aggressive appearing focal osseous lesions. Stable 1.3 cm mass inferior to the medial right twelfth rib (series 2/ image 54). Minimal lumbar spondylosis. IMPRESSION: 1. Large infiltrative primary tumor centered in the anterior right liver lobe is mildly decreased in size. Numerous small hepatic metastases are stable in size and demonstrate decreased capsular enhancement, suggesting treatment effect. 2. No new or progressive metastatic disease in the chest, abdomen or pelvis. 3. Stable indeterminate small soft tissue mass inferior to the medial right twelfth rib, which was non hypermetabolic on 13/24/4010 PET-CT, unlikely to be a metastasis. Schwannoma or small vascular malformation was suggested on 01/27/2017 PET-CT report. 4. Stable 1.6 cm right thyroid lobe nodule, which was hypermetabolic on 27/25/3664 PET-CT . Correlation with thyroid ultrasound may be performed as clinically warranted. 5. Chronic findings include: Cholelithiasis. Stable left adrenal adenoma. Chronic calcified thrombus in the left external iliac and left common femoral veins. Electronically Signed   By: Ilona Sorrel M.D.   On: 05/05/2017 12:19   Ct Abdomen Pelvis W Contrast  Result Date: 05/05/2017 CLINICAL DATA:  Intrahepatic cholangiocarcinoma of the  right liver lobe with intrahepatic metastases, diagnosed 01/01/2017, presenting for restaging with ongoing chemotherapy. EXAM: CT CHEST, ABDOMEN, AND PELVIS WITH CONTRAST TECHNIQUE: Multidetector CT imaging of the chest, abdomen and pelvis was performed following the standard protocol during bolus administration of intravenous contrast. CONTRAST:  190mL ISOVUE-300 IOPAMIDOL (ISOVUE-300) INJECTION 61% COMPARISON:  01/27/2017 PET-CT. 12/29/2016 CT chest, abdomen and pelvis. FINDINGS: CT CHEST FINDINGS Cardiovascular: Normal heart size. Stable trace anterior pericardial effusion/thickening. Right internal jugular MediPort terminates at the cavoatrial junction. Great vessels are normal in course and caliber. No central pulmonary emboli. Mediastinum/Nodes: Stable heterogeneous 1.6 cm right thyroid lobe nodule (series 2/image 4). Unremarkable esophagus. No pathologically enlarged axillary, mediastinal or hilar lymph nodes. Top-normal left supraclavicular nodes measuring up to 0.6 cm (series 2/ image 6), decreased from 0.9 cm on 12/29/2016. Lungs/Pleura: No pneumothorax. No pleural effusion. Stable 3 mm solid subpleural right middle lobe pulmonary nodule (series 4/ image 100), more likely benign. Stable calcified 2  mm anterior left lower lobe granuloma. No acute consolidative airspace disease, lung masses or new significant pulmonary nodules. Musculoskeletal: No aggressive appearing focal osseous lesions. Mild thoracic spondylosis. CT ABDOMEN PELVIS FINDINGS Hepatobiliary: Dominant heterogeneous infiltrative liver mass centered in the anterior right liver lobe measures 11.9 x 5.3 cm (series 2/image 68), previously 12.7 x 5.5 cm on 12/29/2016 CT using similar measurement technique, mildly decreased. Numerous (greater than 10) hypodense liver masses scattered throughout the right greater the left liver lobes are not convincingly changed in size and appear to demonstrate decreased capsular enhancement. Representative lesions  include a segment 6 right liver lobe 1.0 cm mass (series 2/ image 66), previously 1.0 cm, stable. Segment 8 right liver lobe 1.6 cm mass (series 2/ image 60), 1 previously 1.6 cm, stable. Liver dome 1.4 cm mass (series 2/ image 54), previously 1.4 cm, stable. No definite new liver masses. Cholelithiasis, with no definite gallbladder wall thickening or pericholecystic fluid. No biliary ductal dilatation. Pancreas: Normal, with no mass or duct dilation. Spleen: Stable top normal size spleen.  No splenic mass. Adrenals/Urinary Tract: Stable 1.2 cm left adrenal nodule, characterized as a benign adenoma on 12/17/2016 MRI no new adrenal nodules. No hydronephrosis. Stable small right renal cysts, largest 1.2 cm in the upper right kidney, as characterized on 12/17/2016 MRI abdomen study. No new renal lesions. Normal bladder. Stomach/Bowel: Grossly normal stomach. Normal caliber small bowel with no small bowel wall thickening. Normal appendix. Normal large bowel with no diverticulosis, large bowel wall thickening or pericolonic fat stranding. Oral contrast transits to the rectum. Vascular/Lymphatic: Normal caliber abdominal aorta. Patent portal, splenic, hepatic and renal veins. Scattered calcifications are noted in the left external iliac and left common femoral vein, unchanged, compatible with chronic thrombosis. No pathologically enlarged lymph nodes in the abdomen or pelvis. Reproductive: Status post hysterectomy, with no abnormal findings at the vaginal cuff. No adnexal mass. Other: No pneumoperitoneum, ascites or focal fluid collection. Musculoskeletal: No aggressive appearing focal osseous lesions. Stable 1.3 cm mass inferior to the medial right twelfth rib (series 2/ image 54). Minimal lumbar spondylosis. IMPRESSION: 1. Large infiltrative primary tumor centered in the anterior right liver lobe is mildly decreased in size. Numerous small hepatic metastases are stable in size and demonstrate decreased capsular  enhancement, suggesting treatment effect. 2. No new or progressive metastatic disease in the chest, abdomen or pelvis. 3. Stable indeterminate small soft tissue mass inferior to the medial right twelfth rib, which was non hypermetabolic on 78/93/8101 PET-CT, unlikely to be a metastasis. Schwannoma or small vascular malformation was suggested on 01/27/2017 PET-CT report. 4. Stable 1.6 cm right thyroid lobe nodule, which was hypermetabolic on 75/04/2584 PET-CT . Correlation with thyroid ultrasound may be performed as clinically warranted. 5. Chronic findings include: Cholelithiasis. Stable left adrenal adenoma. Chronic calcified thrombus in the left external iliac and left common femoral veins. Electronically Signed   By: Ilona Sorrel M.D.   On: 05/05/2017 12:19     CT CAP W Contrast 05/05/17  IMPRESSION: 1. Large infiltrative primary tumor centered in the anterior right liver lobe is mildly decreased in size. Numerous small hepatic metastases are stable in size and demonstrate decreased capsular enhancement, suggesting treatment effect. 2. No new or progressive metastatic disease in the chest, abdomen or pelvis. 3. Stable indeterminate small soft tissue mass inferior to the medial right twelfth rib, which was non hypermetabolic on 27/78/2423 PET-CT, unlikely to be a metastasis. Schwannoma or small vascular malformation was suggested on 01/27/2017 PET-CT report. 4. Stable 1.6  cm right thyroid lobe nodule, which was hypermetabolic on 12/27/7626 PET-CT . Correlation with thyroid ultrasound may be performed as clinically warranted. 5. Chronic findings include: Cholelithiasis. Stable left adrenal adenoma. Chronic calcified thrombus in the left external iliac and left common femoral veins.    ASSESSMENT & PLAN: Cheryl [name] is a  61 y.o. female, with past medical history of DVT on Coumadin, presented with intermittent abdominal pain, and 10 pound weight loss.  1. Intrahepatic cholangiocarcinoma  in right lobe, with liver metastasis   -I previously reviewed her CT, MRI scan findings and liver biopsy result with patient and her husband in details. -Her liver biopsy confirmed adenocarcinoma, consistent with cholangiocarcinoma. Her EGD and endoscopy was negative for primary tumor. CT chest, abdomen and pelvis was negative for other primary or metastasis. -Baseline CA19.9 was elevated at 44 on 01/22/17 -Her case was previously reviewed in our GI tumor Board, the T12 metastasis reported by MRI was not highly suspicious for bone metastasis, after we reviewed in the tumor board. -I reviewed her PET scan images with patient in person, unfortunately he has extensive liver metastasis, including metastatic lesion in the left lobe, no other distant metastasis. -I have discussed with Surgeon Dr. Barry Dienes who doesn't think that surgery resection is feasible due to left lobe liver metastasis.  -I recommend her to start systemic chemotherapy to control her cancer. The goal of therapy is palliative, to prolong her life and reserve quality of life. -she has started first line chemo cisplatin and gemcitabine on 02/12/17, tolerating well so far -Pt plans to move to [place] after Thanksgiving, I previously sent referral to Med Onc in [place].  -We reviewed her restaging CT from 05/05/17 shows the primary tumor has mildly decreased in size, evident of good response to chemotherapy. -She developed severe thrombocytopenia after last cycle chemo, recovered now -I will reduce her Gemcitabine dose 30% starting today 05/14/17 due to her low platelets  -She can restart her Coumadin today (05/14/17). -F/u on 05/21/17    2. Constipation - I encouraged her to drink more water or try prune juice, uses stool softener and laxative as needed. -Improved and manageable   3. Right flank pain - She has been trying Tylenol once daily - I will give her a prescription of Tramadol. She should try to eat before taking to avoid  nausea.  4. Nausea - I will prescribe compazine for nausea.  - Patient still has a good appetite.  -Nausea is mild and manageable   5. Depression  -Was previously on Zoloft, stopped this after one month d/t it not helping her.  -I started her on 10mg  Lexapro daily  -Pt reports to doing much better on Lexapro, will continue  -Pt does not think she was on Lexapro for long enough but discontinued it on her own after 1 month. She still has some anxiety and depression, she will restart Lexapro if after 6-8 weeks if she does not notice improvement in her mood or has side effects, consider dosage or medication change. -She feels better with Lexapro, depression controlled   6. Mild anemia -Secondary to chemotherapy, overall very mild, stable, continue observation -Consider blood transfusion if hemoglobin less than 8 -today 05/14/17 Hg is 10.6, no need for blood transfusion   7.  Severe thrombocytopenia, secondary to chemotherapy -Resolved now -I will reduce her gemcitabine chemotherapy  8. Goal of care discussion  -We again discussed the incurable nature of her cancer, and the overall poor prognosis, especially if she does  not have good response to chemotherapy or progress on chemo -The patient understands the goal of care is palliative. -I recommend DNR/DNI, she will think about it    PLAN -Labs reviewed, PLT adequate to proceed with C5D1 Cisplatin and dose reduced Gemcitabine today - restart Coumadin today, she check PR/INR at her PCP office  -Lab, flush, and C5D8 Cisplatin/Gemcitabine on 05/21/17 -I will see her back in 3 weeks before cycle 6, she will likely relocate after cycle 6     No orders of the defined types were placed in this encounter.  All questions were answered. The patient knows to call the clinic with any problems, questions or concerns.  I spent 25 minutes counseling the patient face to face. The total time spent in the appointment was 30 minutes and more than 50%  was on counseling.   This document serves as a record of services personally performed by Truitt Merle, MD. It was created on her behalf by Joslyn Devon, a trained medical scribe. The creation of this record is based on the scribe's personal observations and the provider's statements to them.    I have reviewed the above documentation for accuracy and completeness, and I agree with the above.    Truitt Merle, MD 05/14/2017

## 2017-05-14 ENCOUNTER — Ambulatory Visit (HOSPITAL_BASED_OUTPATIENT_CLINIC_OR_DEPARTMENT_OTHER): Payer: BLUE CROSS/BLUE SHIELD

## 2017-05-14 ENCOUNTER — Ambulatory Visit (HOSPITAL_BASED_OUTPATIENT_CLINIC_OR_DEPARTMENT_OTHER): Payer: BLUE CROSS/BLUE SHIELD | Admitting: Hematology

## 2017-05-14 ENCOUNTER — Other Ambulatory Visit (HOSPITAL_BASED_OUTPATIENT_CLINIC_OR_DEPARTMENT_OTHER): Payer: BLUE CROSS/BLUE SHIELD

## 2017-05-14 ENCOUNTER — Ambulatory Visit: Payer: BLUE CROSS/BLUE SHIELD

## 2017-05-14 VITALS — BP 118/60 | HR 76 | Temp 98.2°F | Resp 18

## 2017-05-14 DIAGNOSIS — K5900 Constipation, unspecified: Secondary | ICD-10-CM | POA: Diagnosis not present

## 2017-05-14 DIAGNOSIS — Z5111 Encounter for antineoplastic chemotherapy: Secondary | ICD-10-CM

## 2017-05-14 DIAGNOSIS — F32A Depression, unspecified: Secondary | ICD-10-CM

## 2017-05-14 DIAGNOSIS — C787 Secondary malignant neoplasm of liver and intrahepatic bile duct: Secondary | ICD-10-CM

## 2017-05-14 DIAGNOSIS — Z95828 Presence of other vascular implants and grafts: Secondary | ICD-10-CM

## 2017-05-14 DIAGNOSIS — F329 Major depressive disorder, single episode, unspecified: Secondary | ICD-10-CM | POA: Diagnosis not present

## 2017-05-14 DIAGNOSIS — C221 Intrahepatic bile duct carcinoma: Secondary | ICD-10-CM

## 2017-05-14 DIAGNOSIS — R110 Nausea: Secondary | ICD-10-CM

## 2017-05-14 LAB — CBC WITH DIFFERENTIAL/PLATELET
BASO%: 0.6 % (ref 0.0–2.0)
BASOS ABS: 0 10*3/uL (ref 0.0–0.1)
EOS%: 1.9 % (ref 0.0–7.0)
Eosinophils Absolute: 0.1 10*3/uL (ref 0.0–0.5)
HEMATOCRIT: 31.9 % — AB (ref 34.8–46.6)
HGB: 10.6 g/dL — ABNORMAL LOW (ref 11.6–15.9)
LYMPH#: 1.9 10*3/uL (ref 0.9–3.3)
LYMPH%: 26.2 % (ref 14.0–49.7)
MCH: 31.4 pg (ref 25.1–34.0)
MCHC: 33.3 g/dL (ref 31.5–36.0)
MCV: 94.1 fL (ref 79.5–101.0)
MONO#: 0.6 10*3/uL (ref 0.1–0.9)
MONO%: 7.5 % (ref 0.0–14.0)
NEUT#: 4.7 10*3/uL (ref 1.5–6.5)
NEUT%: 63.8 % (ref 38.4–76.8)
PLATELETS: 203 10*3/uL (ref 145–400)
RBC: 3.39 10*6/uL — ABNORMAL LOW (ref 3.70–5.45)
RDW: 19.9 % — ABNORMAL HIGH (ref 11.2–14.5)
WBC: 7.4 10*3/uL (ref 3.9–10.3)

## 2017-05-14 LAB — COMPREHENSIVE METABOLIC PANEL
ALT: 24 U/L (ref 0–55)
AST: 23 U/L (ref 5–34)
Albumin: 3.5 g/dL (ref 3.5–5.0)
Alkaline Phosphatase: 144 U/L (ref 40–150)
Anion Gap: 7 mEq/L (ref 3–11)
BILIRUBIN TOTAL: 0.47 mg/dL (ref 0.20–1.20)
BUN: 14.1 mg/dL (ref 7.0–26.0)
CO2: 25 meq/L (ref 22–29)
Calcium: 9.1 mg/dL (ref 8.4–10.4)
Chloride: 108 mEq/L (ref 98–109)
Creatinine: 0.7 mg/dL (ref 0.6–1.1)
GLUCOSE: 83 mg/dL (ref 70–140)
Potassium: 4.2 mEq/L (ref 3.5–5.1)
SODIUM: 140 meq/L (ref 136–145)
Total Protein: 6.8 g/dL (ref 6.4–8.3)

## 2017-05-14 MED ORDER — SODIUM CHLORIDE 0.9% FLUSH
10.0000 mL | INTRAVENOUS | Status: DC | PRN
  Administered 2017-05-14: 10 mL
  Filled 2017-05-14: qty 10

## 2017-05-14 MED ORDER — SODIUM CHLORIDE 0.9 % IV SOLN
Freq: Once | INTRAVENOUS | Status: AC
  Administered 2017-05-14: 12:00:00 via INTRAVENOUS
  Filled 2017-05-14: qty 5

## 2017-05-14 MED ORDER — POTASSIUM CHLORIDE 2 MEQ/ML IV SOLN
Freq: Once | INTRAVENOUS | Status: AC
  Administered 2017-05-14: 10:00:00 via INTRAVENOUS
  Filled 2017-05-14: qty 10

## 2017-05-14 MED ORDER — SODIUM CHLORIDE 0.9% FLUSH
10.0000 mL | INTRAVENOUS | Status: DC | PRN
  Administered 2017-05-14: 10 mL via INTRAVENOUS
  Filled 2017-05-14: qty 10

## 2017-05-14 MED ORDER — PALONOSETRON HCL INJECTION 0.25 MG/5ML
INTRAVENOUS | Status: AC
Start: 2017-05-14 — End: ?
  Filled 2017-05-14: qty 5

## 2017-05-14 MED ORDER — PALONOSETRON HCL INJECTION 0.25 MG/5ML
0.2500 mg | Freq: Once | INTRAVENOUS | Status: AC
  Administered 2017-05-14: 0.25 mg via INTRAVENOUS

## 2017-05-14 MED ORDER — CISPLATIN CHEMO INJECTION 100MG/100ML
25.0000 mg/m2 | Freq: Once | INTRAVENOUS | Status: AC
  Administered 2017-05-14: 43 mg via INTRAVENOUS
  Filled 2017-05-14: qty 43

## 2017-05-14 MED ORDER — SODIUM CHLORIDE 0.9 % IV SOLN
Freq: Once | INTRAVENOUS | Status: AC
  Administered 2017-05-14: 12:00:00 via INTRAVENOUS

## 2017-05-14 MED ORDER — HEPARIN SOD (PORK) LOCK FLUSH 100 UNIT/ML IV SOLN
500.0000 [IU] | Freq: Once | INTRAVENOUS | Status: AC | PRN
  Administered 2017-05-14: 500 [IU]
  Filled 2017-05-14: qty 5

## 2017-05-14 MED ORDER — SODIUM CHLORIDE 0.9 % IV SOLN
700.0000 mg/m2 | Freq: Once | INTRAVENOUS | Status: AC
  Administered 2017-05-14: 1216 mg via INTRAVENOUS
  Filled 2017-05-14: qty 31.98

## 2017-05-14 NOTE — Patient Instructions (Signed)
[facility] Discharge Instructions for Patients Receiving Chemotherapy  Today you received the following chemotherapy agents Gemzar and Cisplatin  To help prevent nausea and vomiting after your treatment, we encourage you to take your nausea medication as directed   If you develop nausea and vomiting that is not controlled by your nausea medication, call the clinic.   BELOW ARE SYMPTOMS THAT SHOULD BE REPORTED IMMEDIATELY:  *FEVER GREATER THAN 100.5 F  *CHILLS WITH OR WITHOUT FEVER  NAUSEA AND VOMITING THAT IS NOT CONTROLLED WITH YOUR NAUSEA MEDICATION  *UNUSUAL SHORTNESS OF BREATH  *UNUSUAL BRUISING OR BLEEDING  TENDERNESS IN MOUTH AND THROAT WITH OR WITHOUT PRESENCE OF ULCERS  *URINARY PROBLEMS  *BOWEL PROBLEMS  UNUSUAL RASH Items with * indicate a potential emergency and should be followed up as soon as possible.  Feel free to call the clinic should you have any questions or concerns. The clinic phone number is [phone].  Please show the CHEMO ALERT CARD at check-in to the Emergency Department and triage nurse.   

## 2017-05-15 ENCOUNTER — Encounter: Payer: Self-pay | Admitting: Hematology

## 2017-05-15 LAB — CANCER ANTIGEN 19-9: CAN 19-9: 42 U/mL — AB (ref 0–35)

## 2017-05-18 DIAGNOSIS — Z86718 Personal history of other venous thrombosis and embolism: Secondary | ICD-10-CM | POA: Diagnosis not present

## 2017-05-18 DIAGNOSIS — Z7901 Long term (current) use of anticoagulants: Secondary | ICD-10-CM | POA: Diagnosis not present

## 2017-05-21 ENCOUNTER — Ambulatory Visit (HOSPITAL_BASED_OUTPATIENT_CLINIC_OR_DEPARTMENT_OTHER): Payer: BLUE CROSS/BLUE SHIELD

## 2017-05-21 ENCOUNTER — Ambulatory Visit: Payer: BLUE CROSS/BLUE SHIELD

## 2017-05-21 ENCOUNTER — Ambulatory Visit (HOSPITAL_BASED_OUTPATIENT_CLINIC_OR_DEPARTMENT_OTHER): Payer: BLUE CROSS/BLUE SHIELD | Admitting: Nurse Practitioner

## 2017-05-21 ENCOUNTER — Other Ambulatory Visit (HOSPITAL_BASED_OUTPATIENT_CLINIC_OR_DEPARTMENT_OTHER): Payer: BLUE CROSS/BLUE SHIELD

## 2017-05-21 ENCOUNTER — Encounter: Payer: Self-pay | Admitting: Nurse Practitioner

## 2017-05-21 VITALS — BP 136/76 | HR 67 | Temp 97.8°F | Resp 19 | Ht 68.5 in | Wt 137.9 lb

## 2017-05-21 DIAGNOSIS — K5900 Constipation, unspecified: Secondary | ICD-10-CM | POA: Diagnosis not present

## 2017-05-21 DIAGNOSIS — C221 Intrahepatic bile duct carcinoma: Secondary | ICD-10-CM

## 2017-05-21 DIAGNOSIS — D630 Anemia in neoplastic disease: Secondary | ICD-10-CM | POA: Diagnosis not present

## 2017-05-21 DIAGNOSIS — Z5111 Encounter for antineoplastic chemotherapy: Secondary | ICD-10-CM

## 2017-05-21 DIAGNOSIS — F329 Major depressive disorder, single episode, unspecified: Secondary | ICD-10-CM | POA: Diagnosis not present

## 2017-05-21 DIAGNOSIS — C787 Secondary malignant neoplasm of liver and intrahepatic bile duct: Secondary | ICD-10-CM

## 2017-05-21 DIAGNOSIS — Z5189 Encounter for other specified aftercare: Secondary | ICD-10-CM

## 2017-05-21 DIAGNOSIS — F32A Depression, unspecified: Secondary | ICD-10-CM

## 2017-05-21 LAB — COMPREHENSIVE METABOLIC PANEL
ALBUMIN: 3.7 g/dL (ref 3.5–5.0)
ALT: 58 U/L — AB (ref 0–55)
ANION GAP: 7 meq/L (ref 3–11)
AST: 30 U/L (ref 5–34)
Alkaline Phosphatase: 124 U/L (ref 40–150)
BILIRUBIN TOTAL: 0.47 mg/dL (ref 0.20–1.20)
BUN: 18.5 mg/dL (ref 7.0–26.0)
CALCIUM: 9.1 mg/dL (ref 8.4–10.4)
CO2: 25 meq/L (ref 22–29)
CREATININE: 0.7 mg/dL (ref 0.6–1.1)
Chloride: 107 mEq/L (ref 98–109)
EGFR: >60 mL/min/{1.73_m2} (ref >60)
Glucose: 75 mg/dl (ref 70–140)
Potassium: 4.4 mEq/L (ref 3.5–5.1)
Sodium: 139 mEq/L (ref 136–145)
TOTAL PROTEIN: 6.9 g/dL (ref 6.4–8.3)

## 2017-05-21 LAB — CBC WITH DIFFERENTIAL/PLATELET
BASO%: 0.6 % (ref 0.0–2.0)
BASOS ABS: 0 10*3/uL (ref 0.0–0.1)
EOS%: 1 % (ref 0.0–7.0)
Eosinophils Absolute: 0.1 10*3/uL (ref 0.0–0.5)
HCT: 31 % — ABNORMAL LOW (ref 34.8–46.6)
HGB: 10.4 g/dL — ABNORMAL LOW (ref 11.6–15.9)
LYMPH%: 38.8 % (ref 14.0–49.7)
MCH: 31.5 pg (ref 25.1–34.0)
MCHC: 33.5 g/dL (ref 31.5–36.0)
MCV: 93.9 fL (ref 79.5–101.0)
MONO#: 0.3 10*3/uL (ref 0.1–0.9)
MONO%: 5.2 % (ref 0.0–14.0)
NEUT#: 2.9 10*3/uL (ref 1.5–6.5)
NEUT%: 54.4 % (ref 38.4–76.8)
NRBC: 0 % (ref 0–0)
Platelets: 180 10*3/uL (ref 145–400)
RBC: 3.3 10*6/uL — AB (ref 3.70–5.45)
RDW: 16.9 % — AB (ref 11.2–14.5)
WBC: 5.2 10*3/uL (ref 3.9–10.3)
lymph#: 2 10*3/uL (ref 0.9–3.3)

## 2017-05-21 MED ORDER — GEMCITABINE HCL CHEMO INJECTION 1 GM/26.3ML
700.0000 mg/m2 | Freq: Once | INTRAVENOUS | Status: AC
  Administered 2017-05-21: 1216 mg via INTRAVENOUS
  Filled 2017-05-21: qty 31.98

## 2017-05-21 MED ORDER — PALONOSETRON HCL INJECTION 0.25 MG/5ML
0.2500 mg | Freq: Once | INTRAVENOUS | Status: AC
  Administered 2017-05-21: 0.25 mg via INTRAVENOUS

## 2017-05-21 MED ORDER — SODIUM CHLORIDE 0.9 % IV SOLN
25.0000 mg/m2 | Freq: Once | INTRAVENOUS | Status: AC
  Administered 2017-05-21: 43 mg via INTRAVENOUS
  Filled 2017-05-21: qty 43

## 2017-05-21 MED ORDER — PALONOSETRON HCL INJECTION 0.25 MG/5ML
INTRAVENOUS | Status: AC
Start: 2017-05-21 — End: ?
  Filled 2017-05-21: qty 5

## 2017-05-21 MED ORDER — SODIUM CHLORIDE 0.9 % IV SOLN
Freq: Once | INTRAVENOUS | Status: AC
  Administered 2017-05-21: 14:00:00 via INTRAVENOUS

## 2017-05-21 MED ORDER — DEXTROSE-NACL 5-0.45 % IV SOLN
Freq: Once | INTRAVENOUS | Status: AC
  Administered 2017-05-21: 12:00:00 via INTRAVENOUS
  Filled 2017-05-21: qty 10

## 2017-05-21 MED ORDER — HEPARIN SOD (PORK) LOCK FLUSH 100 UNIT/ML IV SOLN
500.0000 [IU] | Freq: Once | INTRAVENOUS | Status: AC | PRN
  Administered 2017-05-21: 500 [IU]
  Filled 2017-05-21: qty 5

## 2017-05-21 MED ORDER — SODIUM CHLORIDE 0.9 % IV SOLN
Freq: Once | INTRAVENOUS | Status: AC
  Administered 2017-05-21: 14:00:00 via INTRAVENOUS
  Filled 2017-05-21: qty 5

## 2017-05-21 MED ORDER — SODIUM CHLORIDE 0.9% FLUSH
10.0000 mL | INTRAVENOUS | Status: DC | PRN
  Administered 2017-05-21: 10 mL
  Filled 2017-05-21: qty 10

## 2017-05-21 MED ORDER — PEGFILGRASTIM 6 MG/0.6ML ~~LOC~~ PSKT
6.0000 mg | PREFILLED_SYRINGE | Freq: Once | SUBCUTANEOUS | Status: AC
  Administered 2017-05-21: 6 mg via SUBCUTANEOUS
  Filled 2017-05-21: qty 0.6

## 2017-05-21 NOTE — Progress Notes (Signed)
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 Widespread hepatic metastasis, without abdominal primary identified. 2. Right twelfth rib metastasis. 3. Soft tissue fullness within the cecum, not entirely imaged. This could represent prominent stool or less likely a colon primary. Consider dedicated chest and pelvic CT or PET to evaluate for primary. 4. Suspicious right cardiophrenic angle adenopathy. Indeterminate/equivocal portal caval node. 5. Left adrenal adenoma. These results will be called to the ordering clinician or representative by the Radiologist Assistant, and communication documented in the PACS or zVision Dashboard.      12/24/2016 Procedure    Upper Endoscopy Nodular distal gastritis, biospied.      12/24/2016 Procedure    Colonoscopy  - The entire examined colon is normal on direct and retroflexion views. - No polyps or cancers.      12/24/2016 Pathology Results     Diagnosis Stomach, biopsy, distal - REACTIVE GASTROPATHY - NO MALIGNANCY IDENTIFIED      12/29/2016 Imaging    CT CAP w contrast  IMPRESSION: 1. No definite CT findings to suggest primary source of the apparent hepatic metastases. Gallbladder is contracted and there is some wall irregularity in the  body of the gallbladder but no gross gallbladder mass lesion is evident. No biliary dilatation. Previous MRI raise the question of the cecal lesion, but there is no evidence for gross cecal mass by CT today. There is some minimal fullness in the distal stomach, but this is a fairly typical appearance on CT and given the smooth appearance of the distal gastric wall and symmetric tissue fullness, neoplasm is felt to be not likely. 2. Hepatic metastases as seen on recent abdominal MRI. 3. Borderline to mild gastrohepatic, hepatoduodenal ligament, and retroperitoneal lymphadenopathy.  4. PET-CT may prove helpful to further evaluate.      01/01/2017 Imaging    US Biopsy  IMPRESSION: Status post ultrasound-guided biopsy of right liver tumor. Tissue specimen sent to pathology for complete histopathologic analysis.      01/01/2017 Procedure    Ultrasound-guided liver biopsy      01/01/2017 Pathology Results    Liver biopsy showed adenocarcinoma, consistent with cholangiocarcinoma.       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 Likely metastatic hypermetabolic foci are present primarily in the right hepatic lobe but also involving the medial and lateral segments of the left hepatic lobe. Small lesion medially in the right twelfth rib subcostal space with very low metabolic activity with maximum SUV of 1.9, previously seen on the prior MRI of 12/17/2016. I am skeptical that this is a metastatic lesion. This could well be a schwannoma or small vascular malformation. This  does appear increased in size compared to 6/20 12/2012, and merits surveillance. Mild splenomegaly but without focal splenic hypermetabolic activity. 2.0 by 1.6 cm hypodense right thyroid mass is mildly hypermetabolic. A  significant minority of such lesions can turn out to represent thyroid cancer. Thyroid sonography is recommended for further characterization. Other imaging findings of potential clinical significance: Suspected cholelithiasis. Aortic Atherosclerosis (ICD10-I70.0).      02/12/2017 -  Chemotherapy    first line chemo cisplatin and gemcitabine. chemotherapy q week x 2 weeks with 1 week off starting 02/12/17      05/05/2017 Imaging    CT CAP W Contrast 05/05/17  IMPRESSION: 1. Large infiltrative primary tumor centered in the anterior right liver lobe is mildly decreased in size. Numerous small hepatic metastases are stable in size and demonstrate decreased capsular enhancement, suggesting treatment effect. 2. No new or progressive metastatic disease in the chest, abdomen or pelvis. 3. Stable indeterminate small soft tissue mass inferior to the medial right twelfth rib, which was non hypermetabolic on 10/93/2355 PET-CT, unlikely to be a metastasis. Schwannoma or small vascular malformation was suggested on 01/27/2017 PET-CT report. 4. Stable 1.6 cm right thyroid lobe nodule, which was hypermetabolic on 73/22/0254 PET-CT . Correlation with thyroid ultrasound may be performed as clinically warranted. 5. Chronic findings include: Cholelithiasis. Stable left adrenal adenoma. Chronic calcified thrombus in the left external iliac and left common femoral veins.        CURRENT THERAPY: first line chemo cisplatin and gemcitabine. chemotherapy q week x 2 weeks with 1 week off starting 02/12/17, gemcitabine dose reduction from 05/14/2017 due to severe thrombocytopenia   INTERVAL HISTORY: Ms. Faust returns for follow-up as scheduled prior to cycle 5-day 8 chemotherapy.  She is tolerating cisplatin and Gemzar well without significant side effects.  Mild constipation controlled with laxative as needed, last BM 05/20/2017.  She has mild fatigue but able to carry on normal activities.  Denies bone  pain with Neulasta.  She will be moving to [place] in December.  REVIEW OF SYSTEMS:   Constitutional: Denies fevers, chills or abnormal weight loss (+) good appetite (+) mild fatigue Eyes: Denies blurriness of vision Ears, nose, mouth, throat, and face: Denies mucositis or sore throat Respiratory: Denies cough, dyspnea or wheezes Cardiovascular: Denies palpitation, chest discomfort (+) lower extremity swelling, h/o DVT on coumadin Gastrointestinal:  Denies nausea, vomiting, diarrhea, heartburn, abdominal pain, or change in bowel habits (+) constipation, managed well with MiraLAX as needed; last BM 05/20/2017 Skin: Denies abnormal skin rashes Lymphatics: Denies new lymphadenopathy or easy bruising Neurological:Denies numbness, tingling or new weaknesses Behavioral/Psych: Mood is stable, no new changes (+) continues on Lexapro All other systems were reviewed with the patient and are negative.  MEDICAL HISTORY:  Past Medical History:  Diagnosis Date  . History of blood clots   . Liver mass   . PONV (postoperative nausea and vomiting)    nausea    SURGICAL HISTORY: Past Surgical History:  Procedure Laterality Date  . ABDOMINAL HYSTERECTOMY     complete  . CESAREAN SECTION     x 1  . COLONOSCOPY WITH PROPOFOL N/A 12/24/2016   Performed by Milus Banister, MD at [place]  . ESOPHAGOGASTRODUODENOSCOPY (EGD) WITH PROPOFOL N/A 12/24/2016   Performed by Milus Banister, MD at [place]  . IR FLUORO GUIDE PORT INSERTION RIGHT  03/09/2017  . IR US GUIDE VASC ACCESS RIGHT  03/09/2017    I have reviewed the social history and family history with the  patient and they are unchanged from previous note.  ALLERGIES:  is allergic to dilaudid [hydromorphone hcl]; sulfa antibiotics; and tramadol.  MEDICATIONS:  Current Outpatient Medications  Medication Sig Dispense Refill  . acetaminophen (TYLENOL) 500 MG tablet Take 1,000 mg by mouth every 6 (six) hours as needed (for pain.).    Marland Kitchen  escitalopram (LEXAPRO) 10 MG tablet Take 1 tablet (10 mg total) by mouth daily. 30 tablet 2  . lidocaine-prilocaine (EMLA) cream Apply 1 application topically as needed. 30 g 2  . warfarin (COUMADIN) 4 MG tablet Take 4 mg by mouth daily at 6 PM.     . ondansetron (ZOFRAN) 8 MG tablet Take 1 tablet (8 mg total) by mouth 2 (two) times daily as needed. Start on the third day after chemotherapy. (Patient not taking: Reported on 05/21/2017) 30 tablet 1  . prochlorperazine (COMPAZINE) 10 MG tablet Take 1 tablet (10 mg total) by mouth every 6 (six) hours as needed (Nausea or vomiting). (Patient not taking: Reported on 05/21/2017) 30 tablet 1  . traMADol (ULTRAM) 50 MG tablet Take 1 tablet (50 mg total) by mouth every 6 (six) hours as needed. (Patient not taking: Reported on 05/21/2017) 30 tablet 1   No current facility-administered medications for this visit.     PHYSICAL EXAMINATION: ECOG PERFORMANCE STATUS: 1 - Symptomatic but completely ambulatory  Vitals:   05/21/17 1019  BP: 136/76  Pulse: 67  Resp: 19  Temp: 97.8 F (36.6 C)  SpO2: 100%   Filed Weights   05/21/17 1019  Weight: 137 lb 14.4 oz (62.6 kg)    GENERAL:alert, no distress and comfortable SKIN: skin color, texture, turgor are normal, no rashes or significant lesions EYES: normal, Conjunctiva are pink and non-injected, sclera clear OROPHARYNX:no exudate, no erythema and lips, buccal mucosa, and tongue normal  NECK: supple, thyroid normal size, non-tender, without nodularity LYMPH:  no palpable lymphadenopathy in the cervical, axillary or inguinal LUNGS: clear to auscultation and percussion with normal breathing effort HEART: regular rate & rhythm and no murmurs (+) trace left LE edema ABDOMEN:abdomen soft, non-tender and normal bowel sounds; no palpable hepatomegaly or masses Musculoskeletal:no cyanosis of digits and no clubbing  NEURO: alert & oriented x 3 with fluent speech, no focal motor/sensory deficits  LABORATORY  DATA:  I have reviewed the data as listed CBC Latest Ref Rng & Units 05/21/2017 05/14/2017 05/10/2017  WBC 3.9 - 10.3 10e3/uL 5.2 7.4 10.7(H)  Hemoglobin 11.6 - 15.9 g/dL 10.4(L) 10.6(L) 11.0(L)  Hematocrit 34.8 - 46.6 % 31.0(L) 31.9(L) 33.5(L)  Platelets 145 - 400 10e3/uL 180 203 38(L)     CMP Latest Ref Rng & Units 05/21/2017 05/14/2017 05/10/2017  Glucose 70 - 140 mg/dl 75 83 78  BUN 7.0 - 26.0 mg/dL 18.5 14.1 15.9  Creatinine 0.6 - 1.1 mg/dL 0.7 0.7 0.7  Sodium 136 - 145 mEq/L 139 140 138  Potassium 3.5 - 5.1 mEq/L 4.4 4.2 4.1  Chloride 101 - 111 mmol/L - - -  CO2 22 - 29 mEq/L 25 25 26   Calcium 8.4 - 10.4 mg/dL 9.1 9.1 9.3  Total Protein 6.4 - 8.3 g/dL 6.9 6.8 7.3  Total Bilirubin 0.20 - 1.20 mg/dL 0.47 0.47 0.51  Alkaline Phos 40 - 150 U/L 124 144 172(H)  AST 5 - 34 U/L 30 23 25   ALT 0 - 55 U/L 58(H) 24 40   CA 19.9:   01/22/17:  44. 02/12/17: 46 02/19/17: 44 03/05/17: 42 03/12/17: 42 03/26/17: 36 04/02/17: 40 04/23/17: 45  04/30/17: 45 05/07/17: 46 05/10/17: 42 05/14/17: 42 05/21/17: Pending  RADIOGRAPHIC STUDIES: I have personally reviewed the radiological images as listed and agreed with the findings in the report. No results found.   ASSESSMENT & PLAN: 61 y.o.female, with past medical history of DVT on Coumadin, presented with intermittent abdominal pain, and 10 pound weight loss.  1. 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 Constipation 3. Right flank pain 4. Nausea 5. Depression 6. Mild anemia 7. Goals of care discussion 8. Thrombocytopenia  Ms. Schmid appears stable today, tolerating chemo well overall.  Gemcitabine was reduced to 700 mg/m2 with cycle 5 day 1 due to thrombocytopenia, platelets have recovered today to 180k.  She held Coumadin while thrombocytopenic but has resumed therapy 4 mg on Monday, Wednesday, Friday and 6 mg on Tuesday, Thursday, Saturday, Sunday.  Hemoglobin stable at 10.4, she is mildly anemic though does  not need transfusion at this time.  CA 19-9 is stable; vital signs and weight stable, physical exam unremarkable.  She will continue treatment, will proceed with cycle 5 day 8 cisplatin/gemcitabine with Onpro.  She plans to move to [place] in December, Dr. Burr Medico has previously sent referral to med onc.  We will continue to schedule her here for follow-up and treatment until she moves.  She will return in 2 weeks for follow-up and cycle 6.   Plan: Labs reviewed, proceed with cycle 5-day 8 cisplatin/gemcitabine with dose reduction and Onpro Return in 2 weeks for follow-up prior to cycle 6 Follow-up CA 19-9  All questions were answered. The patient knows to call the clinic with any problems, questions or concerns. No barriers to learning was detected.    Alla Feeling, NP 05/21/17

## 2017-05-21 NOTE — Patient Instructions (Signed)
[facility] Discharge Instructions for Patients Receiving Chemotherapy  Today you received the following chemotherapy agents Cisplatin and Gemzar  To help prevent nausea and vomiting after your treatment, we encourage you to take your nausea medication as directed.    If you develop nausea and vomiting that is not controlled by your nausea medication, call the clinic.   BELOW ARE SYMPTOMS THAT SHOULD BE REPORTED IMMEDIATELY:  *FEVER GREATER THAN 100.5 F  *CHILLS WITH OR WITHOUT FEVER  NAUSEA AND VOMITING THAT IS NOT CONTROLLED WITH YOUR NAUSEA MEDICATION  *UNUSUAL SHORTNESS OF BREATH  *UNUSUAL BRUISING OR BLEEDING  TENDERNESS IN MOUTH AND THROAT WITH OR WITHOUT PRESENCE OF ULCERS  *URINARY PROBLEMS  *BOWEL PROBLEMS  UNUSUAL RASH Items with * indicate a potential emergency and should be followed up as soon as possible.  Feel free to call the clinic should you have any questions or concerns. The clinic phone number is [phone].  Please show the CHEMO ALERT CARD at check-in to the Emergency Department and triage nurse.   

## 2017-05-22 LAB — CANCER ANTIGEN 19-9: CA 19-9: 43 U/mL — ABNORMAL HIGH (ref 0–35)

## 2017-05-28 ENCOUNTER — Telehealth: Payer: Self-pay | Admitting: Hematology

## 2017-05-28 NOTE — Telephone Encounter (Signed)
Spoke to patient regarding upcoming November appointments. °

## 2017-06-01 ENCOUNTER — Telehealth: Payer: Self-pay | Admitting: Hematology

## 2017-06-01 ENCOUNTER — Ambulatory Visit: Payer: BLUE CROSS/BLUE SHIELD

## 2017-06-01 ENCOUNTER — Other Ambulatory Visit (HOSPITAL_BASED_OUTPATIENT_CLINIC_OR_DEPARTMENT_OTHER): Payer: BLUE CROSS/BLUE SHIELD

## 2017-06-01 ENCOUNTER — Ambulatory Visit (HOSPITAL_BASED_OUTPATIENT_CLINIC_OR_DEPARTMENT_OTHER): Payer: BLUE CROSS/BLUE SHIELD | Admitting: Hematology

## 2017-06-01 ENCOUNTER — Encounter: Payer: Self-pay | Admitting: Hematology

## 2017-06-01 VITALS — BP 135/79 | HR 71 | Temp 97.9°F | Resp 20 | Ht 68.5 in | Wt 138.3 lb

## 2017-06-01 DIAGNOSIS — Z95828 Presence of other vascular implants and grafts: Secondary | ICD-10-CM

## 2017-06-01 DIAGNOSIS — R109 Unspecified abdominal pain: Secondary | ICD-10-CM

## 2017-06-01 DIAGNOSIS — F329 Major depressive disorder, single episode, unspecified: Secondary | ICD-10-CM

## 2017-06-01 DIAGNOSIS — R110 Nausea: Secondary | ICD-10-CM

## 2017-06-01 DIAGNOSIS — C787 Secondary malignant neoplasm of liver and intrahepatic bile duct: Secondary | ICD-10-CM | POA: Diagnosis not present

## 2017-06-01 DIAGNOSIS — C221 Intrahepatic bile duct carcinoma: Secondary | ICD-10-CM

## 2017-06-01 DIAGNOSIS — F32A Depression, unspecified: Secondary | ICD-10-CM

## 2017-06-01 DIAGNOSIS — D6481 Anemia due to antineoplastic chemotherapy: Secondary | ICD-10-CM | POA: Diagnosis not present

## 2017-06-01 DIAGNOSIS — D696 Thrombocytopenia, unspecified: Secondary | ICD-10-CM

## 2017-06-01 LAB — CBC WITH DIFFERENTIAL/PLATELET
BASO%: 0.2 % (ref 0.0–2.0)
Basophils Absolute: 0 10*3/uL (ref 0.0–0.1)
EOS ABS: 0.1 10*3/uL (ref 0.0–0.5)
EOS%: 0.8 % (ref 0.0–7.0)
HCT: 29.9 % — ABNORMAL LOW (ref 34.8–46.6)
HGB: 10 g/dL — ABNORMAL LOW (ref 11.6–15.9)
LYMPH%: 28.1 % (ref 14.0–49.7)
MCH: 32.3 pg (ref 25.1–34.0)
MCHC: 33.4 g/dL (ref 31.5–36.0)
MCV: 96.5 fL (ref 79.5–101.0)
MONO#: 0.6 10*3/uL (ref 0.1–0.9)
MONO%: 9.3 % (ref 0.0–14.0)
NEUT#: 4 10*3/uL (ref 1.5–6.5)
NEUT%: 61.6 % (ref 38.4–76.8)
PLATELETS: 52 10*3/uL — AB (ref 145–400)
RBC: 3.1 10*6/uL — AB (ref 3.70–5.45)
RDW: 17.8 % — ABNORMAL HIGH (ref 11.2–14.5)
WBC: 6.5 10*3/uL (ref 3.9–10.3)
lymph#: 1.8 10*3/uL (ref 0.9–3.3)

## 2017-06-01 LAB — COMPREHENSIVE METABOLIC PANEL
ALBUMIN: 3.7 g/dL (ref 3.5–5.0)
ALT: 30 U/L (ref 0–55)
ANION GAP: 7 meq/L (ref 3–11)
AST: 25 U/L (ref 5–34)
Alkaline Phosphatase: 123 U/L (ref 40–150)
BUN: 14.5 mg/dL (ref 7.0–26.0)
CALCIUM: 9 mg/dL (ref 8.4–10.4)
CHLORIDE: 109 meq/L (ref 98–109)
CO2: 24 mEq/L (ref 22–29)
CREATININE: 0.7 mg/dL (ref 0.6–1.1)
EGFR: >60 mL/min/{1.73_m2} (ref >60)
GLUCOSE: 80 mg/dL (ref 70–140)
POTASSIUM: 4 meq/L (ref 3.5–5.1)
Sodium: 140 mEq/L (ref 136–145)
Total Bilirubin: 0.36 mg/dL (ref 0.20–1.20)
Total Protein: 6.9 g/dL (ref 6.4–8.3)

## 2017-06-01 MED ORDER — HEPARIN SOD (PORK) LOCK FLUSH 100 UNIT/ML IV SOLN
500.0000 [IU] | Freq: Once | INTRAVENOUS | Status: AC | PRN
  Administered 2017-06-01: 500 [IU] via INTRAVENOUS
  Filled 2017-06-01: qty 5

## 2017-06-01 MED ORDER — SODIUM CHLORIDE 0.9% FLUSH
10.0000 mL | INTRAVENOUS | Status: DC | PRN
  Administered 2017-06-01: 10 mL via INTRAVENOUS
  Filled 2017-06-01: qty 10

## 2017-06-01 NOTE — Telephone Encounter (Signed)
Scheduled appt per 11/27 los - moved chemo to 10 am for 12/7 - Gave patient AVS and calender per los.

## 2017-06-01 NOTE — Addendum Note (Signed)
Addended by: Jesse Fall on: 06/01/2017 10:08 AM   Modules accepted: Orders

## 2017-06-01 NOTE — Progress Notes (Signed)
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 Widespread hepatic metastasis, without abdominal primary identified. 2. Right twelfth rib metastasis. 3. Soft tissue fullness within the cecum, not entirely imaged. This could represent prominent stool or less likely a colon primary. Consider dedicated chest and pelvic CT or PET to evaluate for primary. 4. Suspicious right cardiophrenic angle adenopathy. Indeterminate/equivocal portal caval node. 5. Left adrenal adenoma. These results will be called to the ordering clinician or representative by the Radiologist Assistant, and communication documented in the PACS or zVision Dashboard.      12/24/2016 Procedure    Upper Endoscopy Nodular distal gastritis, biospied.      12/24/2016 Procedure    Colonoscopy  - The entire examined colon is normal on direct and retroflexion views. - No polyps or cancers.      12/24/2016 Pathology Results     Diagnosis Stomach, biopsy, distal - REACTIVE GASTROPATHY - NO MALIGNANCY IDENTIFIED      12/29/2016 Imaging    CT CAP w contrast  IMPRESSION: 1. No definite CT findings to suggest primary source of the apparent hepatic metastases. Gallbladder is  contracted and there is some wall irregularity in the body of the gallbladder but no gross gallbladder mass lesion is evident. No biliary dilatation. Previous MRI raise the question of the cecal lesion, but there is no evidence for gross cecal mass by CT today. There is some minimal fullness in the distal stomach, but this is a fairly typical appearance on CT and given the smooth appearance of the distal gastric wall and symmetric tissue fullness, neoplasm is felt to be not likely. 2. Hepatic metastases as seen on recent abdominal MRI. 3. Borderline to mild gastrohepatic, hepatoduodenal ligament, and retroperitoneal lymphadenopathy.  4. PET-CT may prove helpful to further evaluate.      01/01/2017 Imaging    US Biopsy  IMPRESSION: Status post ultrasound-guided biopsy of right liver tumor. Tissue specimen sent to pathology for complete histopathologic analysis.      01/01/2017 Procedure    Ultrasound-guided liver biopsy      01/01/2017 Pathology Results    Liver biopsy showed adenocarcinoma, consistent with cholangiocarcinoma.       01/01/2017 Initial Diagnosis    Cholangiocarcinoma metastatic to liver ([place])      01/27/2017 Imaging    NM PET scan IMPRESSION: Dominant geographic mass in the right hepatic lobe is heterogeneous but hypermetabolic with maximum SUV of 7.5. Likely metastatic hypermetabolic foci are present primarily in the right hepatic lobe but also involving the medial and lateral segments of the left hepatic lobe. Small lesion medially in the right twelfth rib subcostal space with very low metabolic activity with maximum SUV of 1.9, previously seen on the prior MRI of 12/17/2016. I am skeptical that this is a metastatic lesion. This  could well be a schwannoma or small vascular malformation. This does appear increased in size compared to 6/20 12/2012, and merits surveillance. Mild splenomegaly but without focal splenic hypermetabolic activity. 2.0 by 1.6 cm  hypodense right thyroid mass is mildly hypermetabolic. A significant minority of such lesions can turn out to represent thyroid cancer. Thyroid sonography is recommended for further characterization. Other imaging findings of potential clinical significance: Suspected cholelithiasis. Aortic Atherosclerosis (ICD10-I70.0).      02/12/2017 -  Chemotherapy    first line chemo cisplatin and gemcitabine. chemotherapy q week x 2 weeks with 1 week off starting 02/12/17      05/05/2017 Imaging    CT CAP W Contrast 05/05/17  IMPRESSION: 1. Large infiltrative primary tumor centered in the anterior right liver lobe is mildly decreased in size. Numerous small hepatic metastases are stable in size and demonstrate decreased capsular enhancement, suggesting treatment effect. 2. No new or progressive metastatic disease in the chest, abdomen or pelvis. 3. Stable indeterminate small soft tissue mass inferior to the medial right twelfth rib, which was non hypermetabolic on 57/32/2025 PET-CT, unlikely to be a metastasis. Schwannoma or small vascular malformation was suggested on 01/27/2017 PET-CT report. 4. Stable 1.6 cm right thyroid lobe nodule, which was hypermetabolic on 42/70/6237 PET-CT . Correlation with thyroid ultrasound may be performed as clinically warranted. 5. Chronic findings include: Cholelithiasis. Stable left adrenal adenoma. Chronic calcified thrombus in the left external iliac and left common femoral veins.       HISTORY OF PRESENTING ILLNESS (01/12/17):  Cheryl [name] 61 y.o. female is here because of newly diagnosed Cholangiocarcinoma. She was referred by her gastroenterologist Dr. Ardis Hughs. She presents to my clinic with her husband today.  She has had vague, intermittent abdominal pain, and a total of 10 pound weight loss since December. She was originally everted by her primary care physician, and lab test showed elevated liver function tests. She underwent abdominal MRI with and  without contrast on 12/17/2016, which showed wide spread hepatic metastasis, without abdominal primary identified. Soft tissue fullness within the cecum, indeterminate. She was referred to Dr. Ardis Hughs, and had negative colonoscopy and endoscopy on 12/24/2016. She underwent ultrasound guided liver biopsy on 01/01/2017, pathology showed adenocarcinoma, consistent with glandular carcinoma.   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   She reports some pain that started worsened days after her biopsy. She has nausea commonly. She is eating well. She reports fatigue and constipation and she takes [place] to help. Her bowel movements are about every 3 days. She denies chest pain, cough or SOB. She does mammograms normally with her most recent one being in June 2017.  She denies any history of cancer.   CURRENT THERAPY: first line chemo cisplatin and gemcitabine. chemotherapy q week x 2 weeks with 1 week off starting 02/12/17, gemcitabine dose reduction to 700mg /m2 from 05/07/2017 due to severe thrombocytopenia, reduce gemcitabine to 600mg /m2 starting with cycle 9 on 12/7 due to thrombocytopenia.    INTERVAL HISTORY:   NELLA BOTSFORD returns to the clinic today for follow up. She presents to the clinic today noting she still feels well. She believes she will move in late December. She reports to drinking plenty of water and not losing weight. She had a blood clot in her leg in 2010 and has been on Coumadin since. She get her pt/INR checked every month and every 2 weeks if her blood is thin.     MEDICAL HISTORY:  Past Medical  History:  Diagnosis Date  . History of blood clots   . Liver mass   . PONV (postoperative nausea and vomiting)    nausea   SURGICAL HISTORY: Past Surgical History:  Procedure Laterality Date  . ABDOMINAL HYSTERECTOMY     complete  . CESAREAN SECTION     x 1  . COLONOSCOPY WITH PROPOFOL N/A 12/24/2016   Procedure: COLONOSCOPY WITH PROPOFOL;  Surgeon: Milus Banister, MD;  Location: [facility];  Service: Endoscopy;  Laterality: N/A;  . ESOPHAGOGASTRODUODENOSCOPY (EGD) WITH PROPOFOL N/A 12/24/2016   Procedure: ESOPHAGOGASTRODUODENOSCOPY (EGD) WITH PROPOFOL;  Surgeon: Milus Banister, MD;  Location: [facility];  Service: Endoscopy;  Laterality: N/A;  . IR FLUORO GUIDE PORT INSERTION RIGHT  03/09/2017  . IR US GUIDE VASC ACCESS RIGHT  03/09/2017   SOCIAL HISTORY: Social History   Socioeconomic History  . Marital status: Married    Spouse name: Not on file  . Number of children: 1  . Years of education: Not on file  . Highest education level: Not on file  Social Needs  . Financial resource strain: Not on file  . Food insecurity - worry: Not on file  . Food insecurity - inability: Not on file  . Transportation needs - medical: Not on file  . Transportation needs - non-medical: Not on file  Occupational History  . Not on file  Tobacco Use  . Smoking status: Former Smoker    Packs/day: 1.00    Years: 15.00    Pack years: 15.00    Types: Cigarettes  . Smokeless tobacco: Never Used  . Tobacco comment: quit 2010  Substance and Sexual Activity  . Alcohol use: No  . Drug use: No  . Sexual activity: Not on file  Other Topics Concern  . Not on file  Social History Narrative  . Not on file   FAMILY HISTORY: Family History  Problem Relation Age of Onset  . Stomach cancer Mother   . Emphysema Father    ALLERGIES:  is allergic to dilaudid [hydromorphone hcl]; sulfa antibiotics; and tramadol.  MEDICATIONS:  Current Outpatient Medications  Medication Sig Dispense Refill  . acetaminophen (TYLENOL) 500 MG tablet Take 1,000 mg by mouth every 6 (six) hours as needed (for pain.).    Marland Kitchen escitalopram (LEXAPRO) 10 MG tablet Take 1 tablet (10 mg total) by mouth daily. 30 tablet 2  . lidocaine-prilocaine (EMLA) cream Apply 1 application topically as needed. 30 g 2  . warfarin (COUMADIN) 4 MG tablet Take 4 mg by mouth daily at 6 PM.     .  ondansetron (ZOFRAN) 8 MG tablet Take 1 tablet (8 mg total) by mouth 2 (two) times daily as needed. Start on the third day after chemotherapy. (Patient not taking: Reported on 05/21/2017) 30 tablet 1  . prochlorperazine (COMPAZINE) 10 MG tablet Take 1 tablet (10 mg total) by mouth every 6 (six) hours as needed (Nausea or vomiting). (Patient not taking: Reported on 05/21/2017) 30 tablet 1  . traMADol (ULTRAM) 50 MG tablet Take 1 tablet (50 mg total) by mouth every 6 (six) hours as needed. (Patient not taking: Reported on 05/21/2017) 30 tablet 1   No current facility-administered medications for this visit.     REVIEW OF SYSTEMS:  Constitutional: Denies fevers, chills or abnormal night sweats  Eyes: Denies blurriness of vision, double vision or watery eyes Ears, nose, mouth, throat, and face: Denies mucositis or sore throat Respiratory: Denies cough, dyspnea or wheezes Cardiovascular: Denies palpitation, chest  discomfort or lower extremity swelling Gastrointestinal:  Denies heartburn or change in bowel habits, (+) nausea, controlled Skin: Denies abnormal skin rashes Lymphatics: Denies new lymphadenopathy or easy bruising Neurological:Denies numbness, tingling or new weaknesses Behavioral/Psych: (+) depression, controlled MSK: Denies arthralgias.  All other systems were reviewed with the patient and are negative.  PHYSICAL EXAMINATION:  ECOG PERFORMANCE STATUS: 1 - Symptomatic but completely ambulatory Vitals:   06/01/17 0912  BP: 135/79  Pulse: 71  Resp: 20  Temp: 97.9 F (36.6 C)  TempSrc: Oral  SpO2: 100%  Weight: 138 lb 4.8 oz (62.7 kg)  Height: 5' 8.5" (1.74 m)    GENERAL:alert, no distress and comfortable SKIN: skin color, texture, turgor are normal, no rashes or significant lesions EYES: normal, conjunctiva are pink and non-injected, sclera clear OROPHARYNX:no exudate, no erythema and lips, buccal mucosa, and tongue normal  NECK: supple, thyroid normal size, non-tender,  without nodularity LYMPH:  no palpable lymphadenopathy in the cervical, axillary or inguinal LUNGS: clear to auscultation and percussion with normal breathing effort HEART: regular rate & rhythm and no murmurs and no lower extremity edema ABDOMEN:abdomen soft, non-tender and normal bowel sounds. Enlarged liver to 4 cm and tender below the ribcage. Musculoskeletal:no cyanosis of digits and no clubbing  PSYCH: alert & oriented x 3 with fluent speech NEURO: no focal motor/sensory deficits  LABORATORY DATA:  I have reviewed the data as listed CBC Latest Ref Rng & Units 06/01/2017 05/21/2017 05/14/2017  WBC 3.9 - 10.3 10e3/uL 6.5 5.2 7.4  Hemoglobin 11.6 - 15.9 g/dL 10.0(L) 10.4(L) 10.6(L)  Hematocrit 34.8 - 46.6 % 29.9(L) 31.0(L) 31.9(L)  Platelets 145 - 400 10e3/uL 52(L) 180 203   CMP Latest Ref Rng & Units 06/01/2017 05/21/2017 05/14/2017  Glucose 70 - 140 mg/dl 80 75 83  BUN 7.0 - 26.0 mg/dL 14.5 18.5 14.1  Creatinine 0.6 - 1.1 mg/dL 0.7 0.7 0.7  Sodium 136 - 145 mEq/L 140 139 140  Potassium 3.5 - 5.1 mEq/L 4.0 4.4 4.2  Chloride 101 - 111 mmol/L - - -  CO2 22 - 29 mEq/L 24 25 25   Calcium 8.4 - 10.4 mg/dL 9.0 9.1 9.1  Total Protein 6.4 - 8.3 g/dL 6.9 6.9 6.8  Total Bilirubin 0.20 - 1.20 mg/dL 0.36 0.47 0.47  Alkaline Phos 40 - 150 U/L 123 124 144  AST 5 - 34 U/L 25 30 23   ALT 0 - 55 U/L 30 58(H) 24    CA 19.9:   01/22/17:  44. 02/12/17: 46 02/19/17: 44 03/05/17: 42 03/12/17: 42 03/26/17: 36 04/02/17: 40 04/23/17: 45 04/30/17: 45 05/07/17: 46 05/10/17: 42 05/21/17: 43 06/01/17: PENDING     PATHOLOGY: Diagnosis 12/24/2016 Stomach, biopsy, distal - REACTIVE GASTROPATHY - NO MALIGNANCY IDENTIFIED  Diagnosis 01/01/2017 Liver, needle/core biopsy ADENOCARCINOMA, CONSISTENT WITH CHOLANGIOCARCINOMA.  RADIOGRAPHIC STUDIES: I have personally reviewed the radiological images as listed and agreed with the findings in the report. Ct Chest W Contrast  Result Date:  05/05/2017 CLINICAL DATA:  Intrahepatic cholangiocarcinoma of the right liver lobe with intrahepatic metastases, diagnosed 01/01/2017, presenting for restaging with ongoing chemotherapy. EXAM: CT CHEST, ABDOMEN, AND PELVIS WITH CONTRAST TECHNIQUE: Multidetector CT imaging of the chest, abdomen and pelvis was performed following the standard protocol during bolus administration of intravenous contrast. CONTRAST:  118mL ISOVUE-300 IOPAMIDOL (ISOVUE-300) INJECTION 61% COMPARISON:  01/27/2017 PET-CT. 12/29/2016 CT chest, abdomen and pelvis. FINDINGS: CT CHEST FINDINGS Cardiovascular: Normal heart size. Stable trace anterior pericardial effusion/thickening. Right internal jugular MediPort terminates at the cavoatrial junction. Great vessels  are normal in course and caliber. No central pulmonary emboli. Mediastinum/Nodes: Stable heterogeneous 1.6 cm right thyroid lobe nodule (series 2/image 4). Unremarkable esophagus. No pathologically enlarged axillary, mediastinal or hilar lymph nodes. Top-normal left supraclavicular nodes measuring up to 0.6 cm (series 2/ image 6), decreased from 0.9 cm on 12/29/2016. Lungs/Pleura: No pneumothorax. No pleural effusion. Stable 3 mm solid subpleural right middle lobe pulmonary nodule (series 4/ image 100), more likely benign. Stable calcified 2 mm anterior left lower lobe granuloma. No acute consolidative airspace disease, lung masses or new significant pulmonary nodules. Musculoskeletal: No aggressive appearing focal osseous lesions. Mild thoracic spondylosis. CT ABDOMEN PELVIS FINDINGS Hepatobiliary: Dominant heterogeneous infiltrative liver mass centered in the anterior right liver lobe measures 11.9 x 5.3 cm (series 2/image 68), previously 12.7 x 5.5 cm on 12/29/2016 CT using similar measurement technique, mildly decreased. Numerous (greater than 10) hypodense liver masses scattered throughout the right greater the left liver lobes are not convincingly changed in size and appear to  demonstrate decreased capsular enhancement. Representative lesions include a segment 6 right liver lobe 1.0 cm mass (series 2/ image 66), previously 1.0 cm, stable. Segment 8 right liver lobe 1.6 cm mass (series 2/ image 60), 1 previously 1.6 cm, stable. Liver dome 1.4 cm mass (series 2/ image 54), previously 1.4 cm, stable. No definite new liver masses. Cholelithiasis, with no definite gallbladder wall thickening or pericholecystic fluid. No biliary ductal dilatation. Pancreas: Normal, with no mass or duct dilation. Spleen: Stable top normal size spleen.  No splenic mass. Adrenals/Urinary Tract: Stable 1.2 cm left adrenal nodule, characterized as a benign adenoma on 12/17/2016 MRI no new adrenal nodules. No hydronephrosis. Stable small right renal cysts, largest 1.2 cm in the upper right kidney, as characterized on 12/17/2016 MRI abdomen study. No new renal lesions. Normal bladder. Stomach/Bowel: Grossly normal stomach. Normal caliber small bowel with no small bowel wall thickening. Normal appendix. Normal large bowel with no diverticulosis, large bowel wall thickening or pericolonic fat stranding. Oral contrast transits to the rectum. Vascular/Lymphatic: Normal caliber abdominal aorta. Patent portal, splenic, hepatic and renal veins. Scattered calcifications are noted in the left external iliac and left common femoral vein, unchanged, compatible with chronic thrombosis. No pathologically enlarged lymph nodes in the abdomen or pelvis. Reproductive: Status post hysterectomy, with no abnormal findings at the vaginal cuff. No adnexal mass. Other: No pneumoperitoneum, ascites or focal fluid collection. Musculoskeletal: No aggressive appearing focal osseous lesions. Stable 1.3 cm mass inferior to the medial right twelfth rib (series 2/ image 54). Minimal lumbar spondylosis. IMPRESSION: 1. Large infiltrative primary tumor centered in the anterior right liver lobe is mildly decreased in size. Numerous small hepatic  metastases are stable in size and demonstrate decreased capsular enhancement, suggesting treatment effect. 2. No new or progressive metastatic disease in the chest, abdomen or pelvis. 3. Stable indeterminate small soft tissue mass inferior to the medial right twelfth rib, which was non hypermetabolic on 60/45/4098 PET-CT, unlikely to be a metastasis. Schwannoma or small vascular malformation was suggested on 01/27/2017 PET-CT report. 4. Stable 1.6 cm right thyroid lobe nodule, which was hypermetabolic on 11/91/4782 PET-CT . Correlation with thyroid ultrasound may be performed as clinically warranted. 5. Chronic findings include: Cholelithiasis. Stable left adrenal adenoma. Chronic calcified thrombus in the left external iliac and left common femoral veins. Electronically Signed   By: Ilona Sorrel M.D.   On: 05/05/2017 12:19   Ct Abdomen Pelvis W Contrast  Result Date: 05/05/2017 CLINICAL DATA:  Intrahepatic cholangiocarcinoma of the right  liver lobe with intrahepatic metastases, diagnosed 01/01/2017, presenting for restaging with ongoing chemotherapy. EXAM: CT CHEST, ABDOMEN, AND PELVIS WITH CONTRAST TECHNIQUE: Multidetector CT imaging of the chest, abdomen and pelvis was performed following the standard protocol during bolus administration of intravenous contrast. CONTRAST:  135mL ISOVUE-300 IOPAMIDOL (ISOVUE-300) INJECTION 61% COMPARISON:  01/27/2017 PET-CT. 12/29/2016 CT chest, abdomen and pelvis. FINDINGS: CT CHEST FINDINGS Cardiovascular: Normal heart size. Stable trace anterior pericardial effusion/thickening. Right internal jugular MediPort terminates at the cavoatrial junction. Great vessels are normal in course and caliber. No central pulmonary emboli. Mediastinum/Nodes: Stable heterogeneous 1.6 cm right thyroid lobe nodule (series 2/image 4). Unremarkable esophagus. No pathologically enlarged axillary, mediastinal or hilar lymph nodes. Top-normal left supraclavicular nodes measuring up to 0.6 cm  (series 2/ image 6), decreased from 0.9 cm on 12/29/2016. Lungs/Pleura: No pneumothorax. No pleural effusion. Stable 3 mm solid subpleural right middle lobe pulmonary nodule (series 4/ image 100), more likely benign. Stable calcified 2 mm anterior left lower lobe granuloma. No acute consolidative airspace disease, lung masses or new significant pulmonary nodules. Musculoskeletal: No aggressive appearing focal osseous lesions. Mild thoracic spondylosis. CT ABDOMEN PELVIS FINDINGS Hepatobiliary: Dominant heterogeneous infiltrative liver mass centered in the anterior right liver lobe measures 11.9 x 5.3 cm (series 2/image 68), previously 12.7 x 5.5 cm on 12/29/2016 CT using similar measurement technique, mildly decreased. Numerous (greater than 10) hypodense liver masses scattered throughout the right greater the left liver lobes are not convincingly changed in size and appear to demonstrate decreased capsular enhancement. Representative lesions include a segment 6 right liver lobe 1.0 cm mass (series 2/ image 66), previously 1.0 cm, stable. Segment 8 right liver lobe 1.6 cm mass (series 2/ image 60), 1 previously 1.6 cm, stable. Liver dome 1.4 cm mass (series 2/ image 54), previously 1.4 cm, stable. No definite new liver masses. Cholelithiasis, with no definite gallbladder wall thickening or pericholecystic fluid. No biliary ductal dilatation. Pancreas: Normal, with no mass or duct dilation. Spleen: Stable top normal size spleen.  No splenic mass. Adrenals/Urinary Tract: Stable 1.2 cm left adrenal nodule, characterized as a benign adenoma on 12/17/2016 MRI no new adrenal nodules. No hydronephrosis. Stable small right renal cysts, largest 1.2 cm in the upper right kidney, as characterized on 12/17/2016 MRI abdomen study. No new renal lesions. Normal bladder. Stomach/Bowel: Grossly normal stomach. Normal caliber small bowel with no small bowel wall thickening. Normal appendix. Normal large bowel with no diverticulosis,  large bowel wall thickening or pericolonic fat stranding. Oral contrast transits to the rectum. Vascular/Lymphatic: Normal caliber abdominal aorta. Patent portal, splenic, hepatic and renal veins. Scattered calcifications are noted in the left external iliac and left common femoral vein, unchanged, compatible with chronic thrombosis. No pathologically enlarged lymph nodes in the abdomen or pelvis. Reproductive: Status post hysterectomy, with no abnormal findings at the vaginal cuff. No adnexal mass. Other: No pneumoperitoneum, ascites or focal fluid collection. Musculoskeletal: No aggressive appearing focal osseous lesions. Stable 1.3 cm mass inferior to the medial right twelfth rib (series 2/ image 54). Minimal lumbar spondylosis. IMPRESSION: 1. Large infiltrative primary tumor centered in the anterior right liver lobe is mildly decreased in size. Numerous small hepatic metastases are stable in size and demonstrate decreased capsular enhancement, suggesting treatment effect. 2. No new or progressive metastatic disease in the chest, abdomen or pelvis. 3. Stable indeterminate small soft tissue mass inferior to the medial right twelfth rib, which was non hypermetabolic on 78/29/5621 PET-CT, unlikely to be a metastasis. Schwannoma or small vascular malformation was  suggested on 01/27/2017 PET-CT report. 4. Stable 1.6 cm right thyroid lobe nodule, which was hypermetabolic on 95/62/1308 PET-CT . Correlation with thyroid ultrasound may be performed as clinically warranted. 5. Chronic findings include: Cholelithiasis. Stable left adrenal adenoma. Chronic calcified thrombus in the left external iliac and left common femoral veins. Electronically Signed   By: Ilona Sorrel M.D.   On: 05/05/2017 12:19     CT CAP W Contrast 05/05/17  IMPRESSION: 1. Large infiltrative primary tumor centered in the anterior right liver lobe is mildly decreased in size. Numerous small hepatic metastases are stable in size and demonstrate  decreased capsular enhancement, suggesting treatment effect. 2. No new or progressive metastatic disease in the chest, abdomen or pelvis. 3. Stable indeterminate small soft tissue mass inferior to the medial right twelfth rib, which was non hypermetabolic on 65/78/4696 PET-CT, unlikely to be a metastasis. Schwannoma or small vascular malformation was suggested on 01/27/2017 PET-CT report. 4. Stable 1.6 cm right thyroid lobe nodule, which was hypermetabolic on 29/52/8413 PET-CT . Correlation with thyroid ultrasound may be performed as clinically warranted. 5. Chronic findings include: Cholelithiasis. Stable left adrenal adenoma. Chronic calcified thrombus in the left external iliac and left common femoral veins.    ASSESSMENT & PLAN: Cheryl [name] is a  61 y.o. female, with past medical history of DVT on Coumadin, presented with intermittent abdominal pain, and 10 pound weight loss.  1. 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 -Her liver biopsy confirmed adenocarcinoma, consistent with cholangiocarcinoma. Her EGD and endoscopy was negative for primary tumor. CT chest, abdomen and pelvis was negative for other primary or metastasis. -Baseline CA19.9 was elevated at 44 on 01/22/17 -Her case was previously reviewed in our GI tumor Board, the T12 metastasis reported by MRI was not highly suspicious for bone metastasis, after we reviewed in the tumor board. -I reviewed her PET scan images with patient in person, unfortunately he has extensive liver metastasis, including metastatic lesion in the left lobe, no other distant metastasis. -I have discussed with Surgeon Dr. Barry Dienes who doesn't think that surgery resection is feasible due to left lobe liver metastasis.  -I previously recommended her to start systemic chemotherapy to control her cancer. The goal of  therapy is palliative, to prolong her life and reserve quality of life. -she has started first line chemo cisplatin and gemcitabine on 02/12/17, tolerating well so far -Pt plans to move to [place] after Thanksgiving, I previously sent referral to Med Onc in [place].  -We previously reviewed her restaging CT from 05/05/17 shows the primary tumor has mildly decreased in size, partial response to chemotherapy. -Previously, I reduced her Gemcitabine dose to 700mg /m2 starting 05/14/17 due to her low platelets  -Labs reviewed, Her platelet counts has dropped to 52K even on reduced dose. I will hold her treatment today. I think by next treatment her counts will have recovered. I will again reduce her Gemcitabine to 600mg /m2.  -I encouraged her to watch for bleeding while her plt are this low. I will hold her coumadin until 12/1 and she will get her PT/INR checked next week.  -Postpone her treatment to next week 12/7, F/u on 12/14    2. Constipation - I previously encouraged her to drink more water or try prune juice, uses stool softener and laxative as needed. -Improved and manageable   3. Right flank pain - She  has been trying Tylenol once daily - Previously, I gave her a prescription of Tramadol. She should try to eat before taking to avoid nausea. -pain overall much improved   4. Nausea - I previously prescribed compazine for nausea.  - Patient still has a good appetite.  -Nausea is mild and manageable   5. Depression  -Was previously on Zoloft, stopped this after one month d/t it not helping her.  -I started her on 10mg  Lexapro daily  -Pt previously reported to doing much better on Lexapro, will continue  -Previously, pt does not think she was on Lexapro for long enough but discontinued it on her own after 1 month. She still has some anxiety and depression, she will restart Lexapro if after 6-8 weeks if she does not notice improvement in her mood or has side effects, consider dosage or  medication change. -She feels better with Lexapro, depression controlled   6. Mild anemia -Secondary to chemotherapy, overall very mild, stable, continue observation -Consider blood transfusion if hemoglobin less than 8 -Hg 10.0 today (06/01/17), no need for blood transfusion.   7.  Severe thrombocytopenia, secondary to chemotherapy -I will reduce her gemcitabine chemotherapy again   8. Goal of care discussion  -We again discussed the incurable nature of her cancer, and the overall poor prognosis, especially if she does not have good response to chemotherapy or progress on chemo -The patient understands the goal of care is palliative. -I previously recommended DNR/DNI, she will think about it    PLAN -Labs reviewed, plt are 52K today, hold chemo today, postpone to 12/7, reduce Gemcitabine to 600mg /m2 with cycle 9 -Hold Coumadin until 12/1 starting today, repeat PT/INR next week  -lab, flush, gemcitabine and cisplatin on 12/7 and 12/14 -f/u on 12/14    No orders of the defined types were placed in this encounter.  All questions were answered. The patient knows to call the clinic with any problems, questions or concerns.  I spent 15 minutes counseling the patient face to face. The total time spent in the appointment was 20 minutes and more than 50% was on counseling.   This document serves as a record of services personally performed by Truitt Merle, MD. It was created on her behalf by Joslyn Devon, a trained medical scribe. The creation of this record is based on the scribe's personal observations and the provider's statements to them.   I have reviewed the above documentation for accuracy and completeness, and I agree with the above.    Truitt Merle, MD 06/01/2017

## 2017-06-01 NOTE — Patient Instructions (Signed)
Implanted Port Home Guide An implanted port is a type of central line that is placed under the skin. Central lines are used to provide IV access when treatment or nutrition needs to be given through a person's veins. Implanted ports are used for long-term IV access. An implanted port may be placed because:  You need IV medicine that would be irritating to the small veins in your hands or arms.  You need long-term IV medicines, such as antibiotics.  You need IV nutrition for a long period.  You need frequent blood draws for lab tests.  You need dialysis.  Implanted ports are usually placed in the chest area, but they can also be placed in the upper arm, the abdomen, or the leg. An implanted port has two main parts:  Reservoir. The reservoir is round and will appear as a small, raised area under your skin. The reservoir is the part where a needle is inserted to give medicines or draw blood.  Catheter. The catheter is a thin, flexible tube that extends from the reservoir. The catheter is placed into a large vein. Medicine that is inserted into the reservoir goes into the catheter and then into the vein.  How will I care for my incision site? Do not get the incision site wet. Bathe or shower as directed by your health care provider. How is my port accessed? Special steps must be taken to access the port:  Before the port is accessed, a numbing cream can be placed on the skin. This helps numb the skin over the port site.  Your health care provider uses a sterile technique to access the port. ? Your health care provider must put on a mask and sterile gloves. ? The skin over your port is cleaned carefully with an antiseptic and allowed to dry. ? The port is gently pinched between sterile gloves, and a needle is inserted into the port.  Only "non-coring" port needles should be used to access the port. Once the port is accessed, a blood return should be checked. This helps ensure that the port  is in the vein and is not clogged.  If your port needs to remain accessed for a constant infusion, a clear (transparent) bandage will be placed over the needle site. The bandage and needle will need to be changed every week, or as directed by your health care provider.  Keep the bandage covering the needle clean and dry. Do not get it wet. Follow your health care provider's instructions on how to take a shower or bath while the port is accessed.  If your port does not need to stay accessed, no bandage is needed over the port.  What is flushing? Flushing helps keep the port from getting clogged. Follow your health care provider's instructions on how and when to flush the port. Ports are usually flushed with saline solution or a medicine called heparin. The need for flushing will depend on how the port is used.  If the port is used for intermittent medicines or blood draws, the port will need to be flushed: ? After medicines have been given. ? After blood has been drawn. ? As part of routine maintenance.  If a constant infusion is running, the port may not need to be flushed.  How long will my port stay implanted? The port can stay in for as long as your health care provider thinks it is needed. When it is time for the port to come out, surgery will be   done to remove it. The procedure is similar to the one performed when the port was put in. When should I seek immediate medical care? When you have an implanted port, you should seek immediate medical care if:  You notice a bad smell coming from the incision site.  You have swelling, redness, or drainage at the incision site.  You have more swelling or pain at the port site or the surrounding area.  You have a fever that is not controlled with medicine.  This information is not intended to replace advice given to you by your health care provider. Make sure you discuss any questions you have with your health care provider. Document  Released: 06/22/2005 Document Revised: 11/28/2015 Document Reviewed: 02/27/2013 Elsevier Interactive Patient Education  2017 [employer]vier Inc.  

## 2017-06-02 LAB — CANCER ANTIGEN 19-9: CAN 19-9: 38 U/mL — AB (ref 0–35)

## 2017-06-10 DIAGNOSIS — Z86718 Personal history of other venous thrombosis and embolism: Secondary | ICD-10-CM | POA: Diagnosis not present

## 2017-06-10 DIAGNOSIS — Z7901 Long term (current) use of anticoagulants: Secondary | ICD-10-CM | POA: Diagnosis not present

## 2017-06-11 ENCOUNTER — Ambulatory Visit (HOSPITAL_BASED_OUTPATIENT_CLINIC_OR_DEPARTMENT_OTHER): Payer: BLUE CROSS/BLUE SHIELD

## 2017-06-11 ENCOUNTER — Ambulatory Visit: Payer: BLUE CROSS/BLUE SHIELD

## 2017-06-11 ENCOUNTER — Ambulatory Visit: Payer: BLUE CROSS/BLUE SHIELD | Admitting: Hematology

## 2017-06-11 ENCOUNTER — Other Ambulatory Visit (HOSPITAL_BASED_OUTPATIENT_CLINIC_OR_DEPARTMENT_OTHER): Payer: BLUE CROSS/BLUE SHIELD

## 2017-06-11 VITALS — BP 141/88 | HR 70 | Temp 98.6°F | Resp 17 | Ht 68.0 in | Wt 139.8 lb

## 2017-06-11 DIAGNOSIS — C787 Secondary malignant neoplasm of liver and intrahepatic bile duct: Secondary | ICD-10-CM | POA: Diagnosis not present

## 2017-06-11 DIAGNOSIS — C221 Intrahepatic bile duct carcinoma: Secondary | ICD-10-CM

## 2017-06-11 DIAGNOSIS — Z5111 Encounter for antineoplastic chemotherapy: Secondary | ICD-10-CM | POA: Diagnosis not present

## 2017-06-11 DIAGNOSIS — Z95828 Presence of other vascular implants and grafts: Secondary | ICD-10-CM

## 2017-06-11 LAB — COMPREHENSIVE METABOLIC PANEL
ALBUMIN: 3.7 g/dL (ref 3.5–5.0)
ALT: 18 U/L (ref 0–55)
ANION GAP: 9 meq/L (ref 3–11)
AST: 25 U/L (ref 5–34)
Alkaline Phosphatase: 116 U/L (ref 40–150)
BILIRUBIN TOTAL: 0.39 mg/dL (ref 0.20–1.20)
BUN: 14.6 mg/dL (ref 7.0–26.0)
CHLORIDE: 109 meq/L (ref 98–109)
CO2: 23 meq/L (ref 22–29)
CREATININE: 0.7 mg/dL (ref 0.6–1.1)
Calcium: 8.8 mg/dL (ref 8.4–10.4)
Glucose: 75 mg/dl (ref 70–140)
Potassium: 4.3 mEq/L (ref 3.5–5.1)
SODIUM: 141 meq/L (ref 136–145)
Total Protein: 7 g/dL (ref 6.4–8.3)

## 2017-06-11 LAB — CBC WITH DIFFERENTIAL/PLATELET
BASO%: 0.6 % (ref 0.0–2.0)
BASOS ABS: 0 10*3/uL (ref 0.0–0.1)
EOS ABS: 0.2 10*3/uL (ref 0.0–0.5)
EOS%: 3 % (ref 0.0–7.0)
HCT: 33.5 % — ABNORMAL LOW (ref 34.8–46.6)
HGB: 10.9 g/dL — ABNORMAL LOW (ref 11.6–15.9)
LYMPH%: 33.3 % (ref 14.0–49.7)
MCH: 32.1 pg (ref 25.1–34.0)
MCHC: 32.5 g/dL (ref 31.5–36.0)
MCV: 98.5 fL (ref 79.5–101.0)
MONO#: 0.5 10*3/uL (ref 0.1–0.9)
MONO%: 9.4 % (ref 0.0–14.0)
NEUT#: 2.9 10*3/uL (ref 1.5–6.5)
NEUT%: 53.7 % (ref 38.4–76.8)
Platelets: 186 10*3/uL (ref 145–400)
RBC: 3.4 10*6/uL — AB (ref 3.70–5.45)
RDW: 17.2 % — ABNORMAL HIGH (ref 11.2–14.5)
WBC: 5.4 10*3/uL (ref 3.9–10.3)
lymph#: 1.8 10*3/uL (ref 0.9–3.3)

## 2017-06-11 MED ORDER — SODIUM CHLORIDE 0.9 % IV SOLN
Freq: Once | INTRAVENOUS | Status: AC
  Administered 2017-06-11: 10:00:00 via INTRAVENOUS

## 2017-06-11 MED ORDER — SODIUM CHLORIDE 0.9% FLUSH
10.0000 mL | INTRAVENOUS | Status: DC | PRN
  Administered 2017-06-11: 10 mL
  Filled 2017-06-11: qty 10

## 2017-06-11 MED ORDER — FOSAPREPITANT DIMEGLUMINE INJECTION 150 MG
Freq: Once | INTRAVENOUS | Status: AC
Start: 2017-06-11 — End: 2017-06-11
  Administered 2017-06-11: 13:00:00 via INTRAVENOUS
  Filled 2017-06-11: qty 5

## 2017-06-11 MED ORDER — SODIUM CHLORIDE 0.9 % IV SOLN
600.0000 mg/m2 | Freq: Once | INTRAVENOUS | Status: AC
  Administered 2017-06-11: 1026 mg via INTRAVENOUS
  Filled 2017-06-11: qty 26.98

## 2017-06-11 MED ORDER — POTASSIUM CHLORIDE 2 MEQ/ML IV SOLN
Freq: Once | INTRAVENOUS | Status: AC
  Administered 2017-06-11: 11:00:00 via INTRAVENOUS
  Filled 2017-06-11: qty 10

## 2017-06-11 MED ORDER — SODIUM CHLORIDE 0.9 % IV SOLN
25.0000 mg/m2 | Freq: Once | INTRAVENOUS | Status: AC
  Administered 2017-06-11: 43 mg via INTRAVENOUS
  Filled 2017-06-11: qty 43

## 2017-06-11 MED ORDER — PALONOSETRON HCL INJECTION 0.25 MG/5ML
INTRAVENOUS | Status: AC
  Filled 2017-06-11: qty 5

## 2017-06-11 MED ORDER — PALONOSETRON HCL INJECTION 0.25 MG/5ML
0.2500 mg | Freq: Once | INTRAVENOUS | Status: AC
  Administered 2017-06-11: 0.25 mg via INTRAVENOUS

## 2017-06-11 MED ORDER — SODIUM CHLORIDE 0.9% FLUSH
10.0000 mL | INTRAVENOUS | Status: DC | PRN
  Administered 2017-06-11: 10 mL via INTRAVENOUS
  Filled 2017-06-11: qty 10

## 2017-06-11 MED ORDER — HEPARIN SOD (PORK) LOCK FLUSH 100 UNIT/ML IV SOLN
500.0000 [IU] | Freq: Once | INTRAVENOUS | Status: AC | PRN
  Administered 2017-06-11: 500 [IU]
  Filled 2017-06-11: qty 5

## 2017-06-11 NOTE — Patient Instructions (Signed)
Implanted Port Home Guide An implanted port is a type of central line that is placed under the skin. Central lines are used to provide IV access when treatment or nutrition needs to be given through a person's veins. Implanted ports are used for long-term IV access. An implanted port may be placed because:  You need IV medicine that would be irritating to the small veins in your hands or arms.  You need long-term IV medicines, such as antibiotics.  You need IV nutrition for a long period.  You need frequent blood draws for lab tests.  You need dialysis.  Implanted ports are usually placed in the chest area, but they can also be placed in the upper arm, the abdomen, or the leg. An implanted port has two main parts:  Reservoir. The reservoir is round and will appear as a small, raised area under your skin. The reservoir is the part where a needle is inserted to give medicines or draw blood.  Catheter. The catheter is a thin, flexible tube that extends from the reservoir. The catheter is placed into a large vein. Medicine that is inserted into the reservoir goes into the catheter and then into the vein.  How will I care for my incision site? Do not get the incision site wet. Bathe or shower as directed by your health care provider. How is my port accessed? Special steps must be taken to access the port:  Before the port is accessed, a numbing cream can be placed on the skin. This helps numb the skin over the port site.  Your health care provider uses a sterile technique to access the port. ? Your health care provider must put on a mask and sterile gloves. ? The skin over your port is cleaned carefully with an antiseptic and allowed to dry. ? The port is gently pinched between sterile gloves, and a needle is inserted into the port.  Only "non-coring" port needles should be used to access the port. Once the port is accessed, a blood return should be checked. This helps ensure that the port  is in the vein and is not clogged.  If your port needs to remain accessed for a constant infusion, a clear (transparent) bandage will be placed over the needle site. The bandage and needle will need to be changed every week, or as directed by your health care provider.  Keep the bandage covering the needle clean and dry. Do not get it wet. Follow your health care provider's instructions on how to take a shower or bath while the port is accessed.  If your port does not need to stay accessed, no bandage is needed over the port.  What is flushing? Flushing helps keep the port from getting clogged. Follow your health care provider's instructions on how and when to flush the port. Ports are usually flushed with saline solution or a medicine called heparin. The need for flushing will depend on how the port is used.  If the port is used for intermittent medicines or blood draws, the port will need to be flushed: ? After medicines have been given. ? After blood has been drawn. ? As part of routine maintenance.  If a constant infusion is running, the port may not need to be flushed.  How long will my port stay implanted? The port can stay in for as long as your health care provider thinks it is needed. When it is time for the port to come out, surgery will be   done to remove it. The procedure is similar to the one performed when the port was put in. When should I seek immediate medical care? When you have an implanted port, you should seek immediate medical care if:  You notice a bad smell coming from the incision site.  You have swelling, redness, or drainage at the incision site.  You have more swelling or pain at the port site or the surrounding area.  You have a fever that is not controlled with medicine.  This information is not intended to replace advice given to you by your health care provider. Make sure you discuss any questions you have with your health care provider. Document  Released: 06/22/2005 Document Revised: 11/28/2015 Document Reviewed: 02/27/2013 Elsevier Interactive Patient Education  2017 [employer]vier Inc.  

## 2017-06-11 NOTE — Patient Instructions (Signed)
[facility] Discharge Instructions for Patients Receiving Chemotherapy  Today you received the following chemotherapy agents: Gemcitabine (Gemzar) and Cisplatin (Platinol).  To help prevent nausea and vomiting after your treatment, we encourage you to take your nausea medication as prescribed.  If you develop nausea and vomiting that is not controlled by your nausea medication, call the clinic.   BELOW ARE SYMPTOMS THAT SHOULD BE REPORTED IMMEDIATELY:  *FEVER GREATER THAN 100.5 F  *CHILLS WITH OR WITHOUT FEVER  NAUSEA AND VOMITING THAT IS NOT CONTROLLED WITH YOUR NAUSEA MEDICATION  *UNUSUAL SHORTNESS OF BREATH  *UNUSUAL BRUISING OR BLEEDING  TENDERNESS IN MOUTH AND THROAT WITH OR WITHOUT PRESENCE OF ULCERS  *URINARY PROBLEMS  *BOWEL PROBLEMS  UNUSUAL RASH Items with * indicate a potential emergency and should be followed up as soon as possible.  Feel free to call the clinic should you have any questions or concerns. The clinic phone number is [phone].  Please show the [facility] at check-in to the Emergency Department and triage nurse.

## 2017-06-12 LAB — CANCER ANTIGEN 19-9: CA 19-9: 41 U/mL — ABNORMAL HIGH (ref 0–35)

## 2017-06-17 NOTE — Progress Notes (Signed)
[name]  [phone] Fax:([phone]) [phone]  Clinic Follow up Note   Patient Care Team: Deland Pretty, MD as PCP - General (Internal Medicine) Stark Klein, MD as Consulting Physician (General Surgery) Milus Banister, MD as Attending Physician (Gastroenterology) Truitt Merle, MD as Consulting Physician (Hematology) 06/18/2017  CHIEF COMPLAINT: F/u cholangiocarcinoma metastatic to liver   SUMMARY OF ONCOLOGIC HISTORY: Oncology History   Cancer Staging Cholangiocarcinoma metastatic to liver [facility]) Staging form: Intrahepatic Bile Duct, AJCC 8th Edition - Clinical stage from 01/01/2017: Stage IV (cT2, cNX, cM1) - Signed by Truitt Merle, MD on 01/17/2017       Cholangiocarcinoma metastatic to liver ([place])   12/17/2016 Imaging    MR Abdomen MRCP w wo conteast  IMPRESSION: 1. Widespread hepatic metastasis, without abdominal primary identified. 2. Right twelfth rib metastasis. 3. Soft tissue fullness within the cecum, not entirely imaged. This could represent prominent stool or less likely a colon primary. Consider dedicated chest and pelvic CT or PET to evaluate for primary. 4. Suspicious right cardiophrenic angle adenopathy. Indeterminate/equivocal portal caval node. 5. Left adrenal adenoma. These results will be called to the ordering clinician or representative by the Radiologist Assistant, and communication documented in the PACS or zVision Dashboard.      12/24/2016 Procedure    Upper Endoscopy Nodular distal gastritis, biospied.      12/24/2016 Procedure    Colonoscopy  - The entire examined colon is normal on direct and retroflexion views. - No polyps or cancers.      12/24/2016 Pathology Results     Diagnosis Stomach, biopsy, distal - REACTIVE GASTROPATHY - NO MALIGNANCY IDENTIFIED      12/29/2016 Imaging    CT CAP w contrast  IMPRESSION: 1. No definite CT findings to suggest primary source of the apparent hepatic metastases.  Gallbladder is contracted and there is some wall irregularity in the body of the gallbladder but no gross gallbladder mass lesion is evident. No biliary dilatation. Previous MRI raise the question of the cecal lesion, but there is no evidence for gross cecal mass by CT today. There is some minimal fullness in the distal stomach, but this is a fairly typical appearance on CT and given the smooth appearance of the distal gastric wall and symmetric tissue fullness, neoplasm is felt to be not likely. 2. Hepatic metastases as seen on recent abdominal MRI. 3. Borderline to mild gastrohepatic, hepatoduodenal ligament, and retroperitoneal lymphadenopathy.  4. PET-CT may prove helpful to further evaluate.      01/01/2017 Imaging    US Biopsy  IMPRESSION: Status post ultrasound-guided biopsy of right liver tumor. Tissue specimen sent to pathology for complete histopathologic analysis.      01/01/2017 Procedure    Ultrasound-guided liver biopsy      01/01/2017 Pathology Results    Liver biopsy showed adenocarcinoma, consistent with cholangiocarcinoma.       01/01/2017 Initial Diagnosis    Cholangiocarcinoma metastatic to liver ([place])      01/27/2017 Imaging    NM PET scan IMPRESSION: Dominant geographic mass in the right hepatic lobe is heterogeneous but hypermetabolic with maximum SUV of 7.5. Likely metastatic hypermetabolic foci are present primarily in the right hepatic lobe but also involving the medial and lateral segments of the left hepatic lobe. Small lesion medially in the right twelfth rib subcostal space with very low metabolic activity with maximum SUV of 1.9, previously seen on the prior MRI of 12/17/2016. I am skeptical that this is a metastatic lesion. This could  well be a schwannoma or small vascular malformation. This does appear increased in size compared to 6/20 12/2012, and merits surveillance. Mild splenomegaly but without focal splenic hypermetabolic activity. 2.0 by  1.6 cm hypodense right thyroid mass is mildly hypermetabolic. A significant minority of such lesions can turn out to represent thyroid cancer. Thyroid sonography is recommended for further characterization. Other imaging findings of potential clinical significance: Suspected cholelithiasis. Aortic Atherosclerosis (ICD10-I70.0).      02/12/2017 -  Chemotherapy    first line chemo cisplatin and gemcitabine. chemotherapy q week x 2 weeks with 1 week off starting 02/12/17      05/05/2017 Imaging    CT CAP W Contrast 05/05/17  IMPRESSION: 1. Large infiltrative primary tumor centered in the anterior right liver lobe is mildly decreased in size. Numerous small hepatic metastases are stable in size and demonstrate decreased capsular enhancement, suggesting treatment effect. 2. No new or progressive metastatic disease in the chest, abdomen or pelvis. 3. Stable indeterminate small soft tissue mass inferior to the medial right twelfth rib, which was non hypermetabolic on 71/12/2692 PET-CT, unlikely to be a metastasis. Schwannoma or small vascular malformation was suggested on 01/27/2017 PET-CT report. 4. Stable 1.6 cm right thyroid lobe nodule, which was hypermetabolic on 85/46/2703 PET-CT . Correlation with thyroid ultrasound may be performed as clinically warranted. 5. Chronic findings include: Cholelithiasis. Stable left adrenal adenoma. Chronic calcified thrombus in the left external iliac and left common femoral veins.      CURRENT THERAPY: first line chemo cisplatin and gemcitabine. chemotherapy q week x 2 weeks with 1 week off starting 02/12/17, gemcitabine dose reduction to 700mg /m2 from 05/07/2017 due to severe thrombocytopenia, reduce gemcitabine to 600mg /m2 starting with cycle 9 on 12/7 due to thrombocytopenia.   INTERVAL HISTORY: Cheryl [name] returns for f/u as scheduled prior to cycle 6 day 8 cisplatin/gemzar. She is tolerating well overall, denies fatigue, decreased appetite, n/v/d,  fever, chills, numbness, tingling, abdominal pain, or bleeding. She has nonpainful hard stools, eats raisin bran cereal to stay regular. Notes a sore abdomen for few days after treatment but does characterize as painful, does not require medication or intervention for pain. She plans to move to [facility] early January 2019.   REVIEW OF SYSTEMS:   Constitutional: Denies fatigue, fevers, chills, decreased appetite, or abnormal weight loss Eyes: Denies blurriness of vision Ears, nose, mouth, throat, and face: Denies mucositis or sore throat Respiratory: Denies cough, dyspnea or wheezes Cardiovascular: Denies palpitation, chest discomfort or lower extremity swelling Gastrointestinal:  Denies nausea, vomiting, constipation, diarrhea, heartburn, blood in stool, or change in bowel habits (+) hard stool, not painful (+) occasional abdominal soreness few days after treatment  Skin: Denies abnormal skin rashes Lymphatics: Denies new lymphadenopathy or easy bruising Neurological:Denies numbness, tingling or new weaknesses Behavioral/Psych: Mood is stable, no new changes  All other systems were reviewed with the patient and are negative.  MEDICAL HISTORY:  Past Medical History:  Diagnosis Date  . History of blood clots   . Liver mass   . PONV (postoperative nausea and vomiting)    nausea    SURGICAL HISTORY: Past Surgical History:  Procedure Laterality Date  . ABDOMINAL HYSTERECTOMY     complete  . CESAREAN SECTION     x 1  . COLONOSCOPY WITH PROPOFOL N/A 12/24/2016   Procedure: COLONOSCOPY WITH PROPOFOL;  Surgeon: Milus Banister, MD;  Location: [facility];  Service: Endoscopy;  Laterality: N/A;  . ESOPHAGOGASTRODUODENOSCOPY (EGD) WITH PROPOFOL N/A 12/24/2016   Procedure: ESOPHAGOGASTRODUODENOSCOPY (  EGD) WITH PROPOFOL;  Surgeon: Milus Banister, MD;  Location: [facility];  Service: Endoscopy;  Laterality: N/A;  . IR FLUORO GUIDE PORT INSERTION RIGHT  03/09/2017  . IR US GUIDE VASC ACCESS  RIGHT  03/09/2017    I have reviewed the social history and family history with the patient and they are unchanged from previous note.  ALLERGIES:  is allergic to dilaudid [hydromorphone hcl]; sulfa antibiotics; and tramadol.  MEDICATIONS:  Current Outpatient Medications  Medication Sig Dispense Refill  . acetaminophen (TYLENOL) 500 MG tablet Take 1,000 mg by mouth every 6 (six) hours as needed (for pain.).    Marland Kitchen escitalopram (LEXAPRO) 10 MG tablet Take 1 tablet (10 mg total) by mouth daily. 30 tablet 2  . lidocaine-prilocaine (EMLA) cream Apply 1 application topically as needed. 30 g 2  . warfarin (COUMADIN) 4 MG tablet Take 4 mg by mouth daily at 6 PM.     . ondansetron (ZOFRAN) 8 MG tablet Take 1 tablet (8 mg total) by mouth 2 (two) times daily as needed. Start on the third day after chemotherapy. (Patient not taking: Reported on 06/18/2017) 30 tablet 1  . prochlorperazine (COMPAZINE) 10 MG tablet Take 1 tablet (10 mg total) by mouth every 6 (six) hours as needed (Nausea or vomiting). (Patient not taking: Reported on 06/18/2017) 30 tablet 1  . traMADol (ULTRAM) 50 MG tablet Take 1 tablet (50 mg total) by mouth every 6 (six) hours as needed. 30 tablet 1   No current facility-administered medications for this visit.    Facility-Administered Medications Ordered in Other Visits  Medication Dose Route Frequency Provider Last Rate Last Dose  . sodium chloride flush (NS) 0.9 % injection 10 mL  10 mL Intravenous PRN Truitt Merle, MD   10 mL at 06/01/17 1000    PHYSICAL EXAMINATION: ECOG PERFORMANCE STATUS: 1 - Symptomatic but completely ambulatory  Vitals:   06/18/17 1013  BP: 123/81  Pulse: 67  Resp: 20  Temp: 97.9 F (36.6 C)  SpO2: 99%   Filed Weights   06/18/17 1013  Weight: 137 lb 12.8 oz (62.5 kg)    GENERAL:alert, no distress and comfortable SKIN: skin color, texture, turgor are normal, no rashes or significant lesions EYES: normal, Conjunctiva are pink and non-injected,  sclera clear OROPHARYNX:no exudate, no erythema and lips, buccal mucosa, and tongue normal  NECK: supple, thyroid normal size, non-tender, without nodularity LYMPH:  no palpable cervical, supraclavicular, axillary, or inguinal lymphadenopathy LUNGS: clear to auscultation bilaterally with normal breathing effort HEART: regular rate & rhythm and no murmurs and no lower extremity edema ABDOMEN:abdomen soft, non-tender and normal bowel sounds. No palpable hepatomegaly  Musculoskeletal:no cyanosis of digits and no clubbing  NEURO: alert & oriented x 3 with fluent speech, no focal motor/sensory deficits PAC without erythema   LABORATORY DATA:  I have reviewed the data as listed CBC Latest Ref Rng & Units 06/18/2017 06/11/2017 06/01/2017  WBC 3.9 - 10.3 10e3/uL 3.8(L) 5.4 6.5  Hemoglobin 11.6 - 15.9 g/dL 10.9(L) 10.9(L) 10.0(L)  Hematocrit 34.8 - 46.6 % 33.2(L) 33.5(L) 29.9(L)  Platelets 145 - 400 10e3/uL 105(L) 186 52(L)    CMP Latest Ref Rng & Units 06/18/2017 06/11/2017 06/01/2017  Glucose 70 - 140 mg/dl 77 75 80  BUN 7.0 - 26.0 mg/dL 15.9 14.6 14.5  Creatinine 0.6 - 1.1 mg/dL 0.7 0.7 0.7  Sodium 136 - 145 mEq/L 141 141 140  Potassium 3.5 - 5.1 mEq/L 4.1 4.3 4.0  Chloride 101 - 111 mmol/L - - -  CO2 22 - 29 mEq/L 24 23 24   Calcium 8.4 - 10.4 mg/dL 9.1 8.8 9.0  Total Protein 6.4 - 8.3 g/dL 6.9 7.0 6.9  Total Bilirubin 0.20 - 1.20 mg/dL 0.48 0.39 0.36  Alkaline Phos 40 - 150 U/L 114 116 123  AST 5 - 34 U/L 29 25 25   ALT 0 - 55 U/L 42 18 30   CA 19.9:   01/22/17:  44. 02/12/17: 46 02/19/17: 44 03/05/17: 42 03/12/17: 42 03/26/17: 36 04/02/17: 40 04/23/17: 45 04/30/17: 45 05/07/17: 46 05/10/17: 42 05/21/17: 43 06/01/17: 38 06/11/17: 41  06/18/17 PENDING   RADIOGRAPHIC STUDIES: I have personally reviewed the radiological images as listed and agreed with the findings in the report. No results found.   ASSESSMENT & PLAN: 61 y.o. female, with past medical history of DVT on Coumadin,  presented with intermittent abdominal pain, and 10 pound weight loss.  1. 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 Constipation 3. Right flank pain 4. Nausea 5. Depression 6. Mild anemia 7. Severe thrombocytopenia, secondary to chemotherapy 8. Goals of care discussion  Ms. Ritacco appears stable today. S/p cycle 6 day 1 cisplatin/gemzar, due day 8 today. She is tolerating chemotherapy very well overall. For hard stool I recommend stool softener and increasing water intake. CMet WNL. CBC with mild anemia, Hgb 10.9 and stable; mild thrombocytopenia plt 105k. Gemcitabine was further dose reduced to 600 mg/m2 with cycle 6 day 1 on 06/11/17. Mild leukopenia, WBC 3.8 with normal ANC 1.8; she will get neulasta Onpro today. We reviewed fever precautions. Will continue chemotherapy at current dose. Last CT 05/05/17. CA 19-9 stable, pending today. She still plane to move to [facility] in 07/2017. Will schedule one more cycle prior to her move. She will return in 2 weeks for f/u and cycle 7 day 1.   PLAN -Begin stool softener PRN, increase water intake for hard stool -Labs reviewed, continue chemotherapy at current dose cisplatin 25 mg/m2; gemcitabine 600 mg/m2 - proceed with cycle 6 day 8 today -Return in 2 weeks for cycle 7 day 1  All questions were answered. The patient knows to call the clinic with any problems, questions or concerns. No barriers to learning was detected.    Cheryl Feeling, NP 06/18/17

## 2017-06-18 ENCOUNTER — Ambulatory Visit: Payer: BLUE CROSS/BLUE SHIELD

## 2017-06-18 ENCOUNTER — Telehealth: Payer: Self-pay | Admitting: Hematology

## 2017-06-18 ENCOUNTER — Other Ambulatory Visit (HOSPITAL_BASED_OUTPATIENT_CLINIC_OR_DEPARTMENT_OTHER): Payer: BLUE CROSS/BLUE SHIELD

## 2017-06-18 ENCOUNTER — Ambulatory Visit (HOSPITAL_BASED_OUTPATIENT_CLINIC_OR_DEPARTMENT_OTHER): Payer: BLUE CROSS/BLUE SHIELD | Admitting: Nurse Practitioner

## 2017-06-18 ENCOUNTER — Ambulatory Visit (HOSPITAL_BASED_OUTPATIENT_CLINIC_OR_DEPARTMENT_OTHER): Payer: BLUE CROSS/BLUE SHIELD

## 2017-06-18 ENCOUNTER — Encounter: Payer: Self-pay | Admitting: Nurse Practitioner

## 2017-06-18 VITALS — BP 123/81 | HR 67 | Temp 97.9°F | Resp 20 | Ht 68.0 in | Wt 137.8 lb

## 2017-06-18 DIAGNOSIS — D630 Anemia in neoplastic disease: Secondary | ICD-10-CM

## 2017-06-18 DIAGNOSIS — D696 Thrombocytopenia, unspecified: Secondary | ICD-10-CM | POA: Diagnosis not present

## 2017-06-18 DIAGNOSIS — C787 Secondary malignant neoplasm of liver and intrahepatic bile duct: Secondary | ICD-10-CM

## 2017-06-18 DIAGNOSIS — Z5111 Encounter for antineoplastic chemotherapy: Secondary | ICD-10-CM

## 2017-06-18 DIAGNOSIS — C221 Intrahepatic bile duct carcinoma: Secondary | ICD-10-CM

## 2017-06-18 DIAGNOSIS — D701 Agranulocytosis secondary to cancer chemotherapy: Secondary | ICD-10-CM | POA: Diagnosis not present

## 2017-06-18 DIAGNOSIS — Z5189 Encounter for other specified aftercare: Secondary | ICD-10-CM

## 2017-06-18 DIAGNOSIS — K5900 Constipation, unspecified: Secondary | ICD-10-CM

## 2017-06-18 DIAGNOSIS — Z95828 Presence of other vascular implants and grafts: Secondary | ICD-10-CM

## 2017-06-18 LAB — CBC WITH DIFFERENTIAL/PLATELET
BASO%: 1 % (ref 0.0–2.0)
Basophils Absolute: 0 10*3/uL (ref 0.0–0.1)
EOS%: 1.6 % (ref 0.0–7.0)
Eosinophils Absolute: 0.1 10*3/uL (ref 0.0–0.5)
HCT: 33.2 % — ABNORMAL LOW (ref 34.8–46.6)
HGB: 10.9 g/dL — ABNORMAL LOW (ref 11.6–15.9)
LYMPH%: 43.6 % (ref 14.0–49.7)
MCH: 31.7 pg (ref 25.1–34.0)
MCHC: 32.9 g/dL (ref 31.5–36.0)
MCV: 96.4 fL (ref 79.5–101.0)
MONO#: 0.2 10*3/uL (ref 0.1–0.9)
MONO%: 5.9 % (ref 0.0–14.0)
NEUT#: 1.8 10*3/uL (ref 1.5–6.5)
NEUT%: 47.9 % (ref 38.4–76.8)
Platelets: 105 10*3/uL — ABNORMAL LOW (ref 145–400)
RBC: 3.44 10*6/uL — ABNORMAL LOW (ref 3.70–5.45)
RDW: 17.6 % — ABNORMAL HIGH (ref 11.2–14.5)
WBC: 3.8 10*3/uL — ABNORMAL LOW (ref 3.9–10.3)
lymph#: 1.7 10*3/uL (ref 0.9–3.3)

## 2017-06-18 LAB — COMPREHENSIVE METABOLIC PANEL
ALBUMIN: 3.8 g/dL (ref 3.5–5.0)
ALK PHOS: 114 U/L (ref 40–150)
ALT: 42 U/L (ref 0–55)
ANION GAP: 9 meq/L (ref 3–11)
AST: 29 U/L (ref 5–34)
BILIRUBIN TOTAL: 0.48 mg/dL (ref 0.20–1.20)
BUN: 15.9 mg/dL (ref 7.0–26.0)
CALCIUM: 9.1 mg/dL (ref 8.4–10.4)
CO2: 24 meq/L (ref 22–29)
CREATININE: 0.7 mg/dL (ref 0.6–1.1)
Chloride: 107 mEq/L (ref 98–109)
EGFR: >60 mL/min/{1.73_m2} (ref >60)
Glucose: 77 mg/dl (ref 70–140)
Potassium: 4.1 mEq/L (ref 3.5–5.1)
Sodium: 141 mEq/L (ref 136–145)
TOTAL PROTEIN: 6.9 g/dL (ref 6.4–8.3)

## 2017-06-18 MED ORDER — GEMCITABINE HCL CHEMO INJECTION 1 GM/26.3ML
600.0000 mg/m2 | Freq: Once | INTRAVENOUS | Status: AC
  Administered 2017-06-18: 1026 mg via INTRAVENOUS
  Filled 2017-06-18: qty 26.98

## 2017-06-18 MED ORDER — POTASSIUM CHLORIDE 2 MEQ/ML IV SOLN
Freq: Once | INTRAVENOUS | Status: AC
  Administered 2017-06-18: 12:00:00 via INTRAVENOUS
  Filled 2017-06-18: qty 10

## 2017-06-18 MED ORDER — PALONOSETRON HCL INJECTION 0.25 MG/5ML
INTRAVENOUS | Status: AC
  Filled 2017-06-18: qty 5

## 2017-06-18 MED ORDER — SODIUM CHLORIDE 0.9% FLUSH
10.0000 mL | INTRAVENOUS | Status: DC | PRN
  Administered 2017-06-18: 10 mL via INTRAVENOUS
  Filled 2017-06-18: qty 10

## 2017-06-18 MED ORDER — PEGFILGRASTIM 6 MG/0.6ML ~~LOC~~ PSKT
PREFILLED_SYRINGE | SUBCUTANEOUS | Status: AC
  Filled 2017-06-18: qty 0.6

## 2017-06-18 MED ORDER — PALONOSETRON HCL INJECTION 0.25 MG/5ML
0.2500 mg | Freq: Once | INTRAVENOUS | Status: AC
  Administered 2017-06-18: 0.25 mg via INTRAVENOUS

## 2017-06-18 MED ORDER — SODIUM CHLORIDE 0.9 % IV SOLN
Freq: Once | INTRAVENOUS | Status: AC
  Administered 2017-06-18: 14:00:00 via INTRAVENOUS

## 2017-06-18 MED ORDER — CISPLATIN CHEMO INJECTION 100MG/100ML
25.0000 mg/m2 | Freq: Once | INTRAVENOUS | Status: AC
  Administered 2017-06-18: 43 mg via INTRAVENOUS
  Filled 2017-06-18: qty 43

## 2017-06-18 MED ORDER — SODIUM CHLORIDE 0.9% FLUSH
10.0000 mL | INTRAVENOUS | Status: DC | PRN
  Administered 2017-06-18: 10 mL
  Filled 2017-06-18: qty 10

## 2017-06-18 MED ORDER — PEGFILGRASTIM 6 MG/0.6ML ~~LOC~~ PSKT
6.0000 mg | PREFILLED_SYRINGE | Freq: Once | SUBCUTANEOUS | Status: AC
  Administered 2017-06-18: 6 mg via SUBCUTANEOUS

## 2017-06-18 MED ORDER — SODIUM CHLORIDE 0.9 % IV SOLN
Freq: Once | INTRAVENOUS | Status: AC
  Administered 2017-06-18: 14:00:00 via INTRAVENOUS
  Filled 2017-06-18: qty 5

## 2017-06-18 MED ORDER — HEPARIN SOD (PORK) LOCK FLUSH 100 UNIT/ML IV SOLN
500.0000 [IU] | Freq: Once | INTRAVENOUS | Status: AC | PRN
  Administered 2017-06-18: 500 [IU]
  Filled 2017-06-18: qty 5

## 2017-06-18 NOTE — Telephone Encounter (Signed)
Gave avs and calendar for December waiting for 1/4

## 2017-06-18 NOTE — Patient Instructions (Signed)
Implanted Port Home Guide An implanted port is a type of central line that is placed under the skin. Central lines are used to provide IV access when treatment or nutrition needs to be given through a person's veins. Implanted ports are used for long-term IV access. An implanted port may be placed because:  You need IV medicine that would be irritating to the small veins in your hands or arms.  You need long-term IV medicines, such as antibiotics.  You need IV nutrition for a long period.  You need frequent blood draws for lab tests.  You need dialysis.  Implanted ports are usually placed in the chest area, but they can also be placed in the upper arm, the abdomen, or the leg. An implanted port has two main parts:  Reservoir. The reservoir is round and will appear as a small, raised area under your skin. The reservoir is the part where a needle is inserted to give medicines or draw blood.  Catheter. The catheter is a thin, flexible tube that extends from the reservoir. The catheter is placed into a large vein. Medicine that is inserted into the reservoir goes into the catheter and then into the vein.  How will I care for my incision site? Do not get the incision site wet. Bathe or shower as directed by your health care provider. How is my port accessed? Special steps must be taken to access the port:  Before the port is accessed, a numbing cream can be placed on the skin. This helps numb the skin over the port site.  Your health care provider uses a sterile technique to access the port. ? Your health care provider must put on a mask and sterile gloves. ? The skin over your port is cleaned carefully with an antiseptic and allowed to dry. ? The port is gently pinched between sterile gloves, and a needle is inserted into the port.  Only "non-coring" port needles should be used to access the port. Once the port is accessed, a blood return should be checked. This helps ensure that the port  is in the vein and is not clogged.  If your port needs to remain accessed for a constant infusion, a clear (transparent) bandage will be placed over the needle site. The bandage and needle will need to be changed every week, or as directed by your health care provider.  Keep the bandage covering the needle clean and dry. Do not get it wet. Follow your health care provider's instructions on how to take a shower or bath while the port is accessed.  If your port does not need to stay accessed, no bandage is needed over the port.  What is flushing? Flushing helps keep the port from getting clogged. Follow your health care provider's instructions on how and when to flush the port. Ports are usually flushed with saline solution or a medicine called heparin. The need for flushing will depend on how the port is used.  If the port is used for intermittent medicines or blood draws, the port will need to be flushed: ? After medicines have been given. ? After blood has been drawn. ? As part of routine maintenance.  If a constant infusion is running, the port may not need to be flushed.  How long will my port stay implanted? The port can stay in for as long as your health care provider thinks it is needed. When it is time for the port to come out, surgery will be   done to remove it. The procedure is similar to the one performed when the port was put in. When should I seek immediate medical care? When you have an implanted port, you should seek immediate medical care if:  You notice a bad smell coming from the incision site.  You have swelling, redness, or drainage at the incision site.  You have more swelling or pain at the port site or the surrounding area.  You have a fever that is not controlled with medicine.  This information is not intended to replace advice given to you by your health care provider. Make sure you discuss any questions you have with your health care provider. Document  Released: 06/22/2005 Document Revised: 11/28/2015 Document Reviewed: 02/27/2013 Elsevier Interactive Patient Education  2017 [employer]vier Inc.  

## 2017-06-18 NOTE — Patient Instructions (Signed)
[facility] Discharge Instructions for Patients Receiving Chemotherapy  Today you received the following chemotherapy agents Gemzar and Cisplatin  To help prevent nausea and vomiting after your treatment, we encourage you to take your nausea medication as directed   If you develop nausea and vomiting that is not controlled by your nausea medication, call the clinic.   BELOW ARE SYMPTOMS THAT SHOULD BE REPORTED IMMEDIATELY:  *FEVER GREATER THAN 100.5 F  *CHILLS WITH OR WITHOUT FEVER  NAUSEA AND VOMITING THAT IS NOT CONTROLLED WITH YOUR NAUSEA MEDICATION  *UNUSUAL SHORTNESS OF BREATH  *UNUSUAL BRUISING OR BLEEDING  TENDERNESS IN MOUTH AND THROAT WITH OR WITHOUT PRESENCE OF ULCERS  *URINARY PROBLEMS  *BOWEL PROBLEMS  UNUSUAL RASH Items with * indicate a potential emergency and should be followed up as soon as possible.  Feel free to call the clinic should you have any questions or concerns. The clinic phone number is [phone].  Please show the CHEMO ALERT CARD at check-in to the Emergency Department and triage nurse.   

## 2017-06-19 LAB — CANCER ANTIGEN 19-9: CA 19-9: 40 U/mL — ABNORMAL HIGH (ref 0–35)

## 2017-06-23 ENCOUNTER — Telehealth: Payer: Self-pay | Admitting: Hematology

## 2017-06-23 NOTE — Telephone Encounter (Signed)
Called regarding schedule °

## 2017-06-24 DIAGNOSIS — Z86718 Personal history of other venous thrombosis and embolism: Secondary | ICD-10-CM | POA: Diagnosis not present

## 2017-06-24 DIAGNOSIS — Z7901 Long term (current) use of anticoagulants: Secondary | ICD-10-CM | POA: Diagnosis not present

## 2017-06-25 ENCOUNTER — Ambulatory Visit: Payer: BLUE CROSS/BLUE SHIELD

## 2017-06-25 ENCOUNTER — Other Ambulatory Visit: Payer: BLUE CROSS/BLUE SHIELD

## 2017-06-25 ENCOUNTER — Ambulatory Visit: Payer: BLUE CROSS/BLUE SHIELD | Admitting: Nurse Practitioner

## 2017-07-02 ENCOUNTER — Ambulatory Visit: Payer: BLUE CROSS/BLUE SHIELD

## 2017-07-02 ENCOUNTER — Other Ambulatory Visit (HOSPITAL_BASED_OUTPATIENT_CLINIC_OR_DEPARTMENT_OTHER): Payer: BLUE CROSS/BLUE SHIELD

## 2017-07-02 ENCOUNTER — Ambulatory Visit (HOSPITAL_BASED_OUTPATIENT_CLINIC_OR_DEPARTMENT_OTHER): Payer: BLUE CROSS/BLUE SHIELD

## 2017-07-02 ENCOUNTER — Ambulatory Visit (HOSPITAL_BASED_OUTPATIENT_CLINIC_OR_DEPARTMENT_OTHER): Payer: BLUE CROSS/BLUE SHIELD | Admitting: Hematology

## 2017-07-02 ENCOUNTER — Encounter: Payer: Self-pay | Admitting: Hematology

## 2017-07-02 VITALS — BP 136/53 | HR 70 | Temp 98.7°F | Resp 18 | Ht 68.0 in | Wt 139.0 lb

## 2017-07-02 DIAGNOSIS — C221 Intrahepatic bile duct carcinoma: Secondary | ICD-10-CM | POA: Diagnosis not present

## 2017-07-02 DIAGNOSIS — K5900 Constipation, unspecified: Secondary | ICD-10-CM | POA: Diagnosis not present

## 2017-07-02 DIAGNOSIS — C787 Secondary malignant neoplasm of liver and intrahepatic bile duct: Secondary | ICD-10-CM

## 2017-07-02 DIAGNOSIS — D6959 Other secondary thrombocytopenia: Secondary | ICD-10-CM | POA: Diagnosis not present

## 2017-07-02 DIAGNOSIS — F329 Major depressive disorder, single episode, unspecified: Secondary | ICD-10-CM

## 2017-07-02 DIAGNOSIS — Z95828 Presence of other vascular implants and grafts: Secondary | ICD-10-CM

## 2017-07-02 DIAGNOSIS — R109 Unspecified abdominal pain: Secondary | ICD-10-CM | POA: Diagnosis not present

## 2017-07-02 DIAGNOSIS — R110 Nausea: Secondary | ICD-10-CM

## 2017-07-02 DIAGNOSIS — T451X5A Adverse effect of antineoplastic and immunosuppressive drugs, initial encounter: Secondary | ICD-10-CM

## 2017-07-02 DIAGNOSIS — F32A Depression, unspecified: Secondary | ICD-10-CM

## 2017-07-02 LAB — CBC WITH DIFFERENTIAL/PLATELET
BASO%: 0.4 % (ref 0.0–2.0)
Basophils Absolute: 0 10*3/uL (ref 0.0–0.1)
EOS ABS: 0.1 10*3/uL (ref 0.0–0.5)
EOS%: 1.7 % (ref 0.0–7.0)
HCT: 35.6 % (ref 34.8–46.6)
HGB: 11.6 g/dL (ref 11.6–15.9)
LYMPH%: 24.5 % (ref 14.0–49.7)
MCH: 32.4 pg (ref 25.1–34.0)
MCHC: 32.6 g/dL (ref 31.5–36.0)
MCV: 99.4 fL (ref 79.5–101.0)
MONO#: 0.8 10*3/uL (ref 0.1–0.9)
MONO%: 8.9 % (ref 0.0–14.0)
NEUT#: 5.5 10*3/uL (ref 1.5–6.5)
NEUT%: 64.5 % (ref 38.4–76.8)
Platelets: 90 10*3/uL — ABNORMAL LOW (ref 145–400)
RBC: 3.58 10*6/uL — AB (ref 3.70–5.45)
RDW: 16.7 % — AB (ref 11.2–14.5)
WBC: 8.5 10*3/uL (ref 3.9–10.3)
lymph#: 2.1 10*3/uL (ref 0.9–3.3)

## 2017-07-02 LAB — COMPREHENSIVE METABOLIC PANEL
ALT: 23 U/L (ref 0–55)
AST: 24 U/L (ref 5–34)
Albumin: 3.9 g/dL (ref 3.5–5.0)
Alkaline Phosphatase: 149 U/L (ref 40–150)
Anion Gap: 7 mEq/L (ref 3–11)
BUN: 17.9 mg/dL (ref 7.0–26.0)
CALCIUM: 9.4 mg/dL (ref 8.4–10.4)
CHLORIDE: 106 meq/L (ref 98–109)
CO2: 28 mEq/L (ref 22–29)
Creatinine: 0.8 mg/dL (ref 0.6–1.1)
EGFR: >60 mL/min/{1.73_m2} (ref >60)
Glucose: 77 mg/dl (ref 70–140)
POTASSIUM: 4.3 meq/L (ref 3.5–5.1)
SODIUM: 141 meq/L (ref 136–145)
Total Bilirubin: 0.35 mg/dL (ref 0.20–1.20)
Total Protein: 7.1 g/dL (ref 6.4–8.3)

## 2017-07-02 MED ORDER — SODIUM CHLORIDE 0.9% FLUSH
10.0000 mL | INTRAVENOUS | Status: DC | PRN
  Administered 2017-07-02: 10 mL via INTRAVENOUS
  Filled 2017-07-02: qty 10

## 2017-07-02 MED ORDER — HEPARIN SOD (PORK) LOCK FLUSH 100 UNIT/ML IV SOLN
500.0000 [IU] | Freq: Once | INTRAVENOUS | Status: AC | PRN
  Administered 2017-07-02: 500 [IU] via INTRAVENOUS
  Filled 2017-07-02: qty 5

## 2017-07-02 MED ORDER — ALTEPLASE 2 MG IJ SOLR
2.0000 mg | Freq: Once | INTRAMUSCULAR | Status: DC | PRN
  Filled 2017-07-02: qty 2

## 2017-07-02 NOTE — Progress Notes (Signed)
Pt's Port-A-Cath accessed with no blood return noted. Site flushes well with no pain or swelling noted upon flushing with NS. Cath-Flo instilled per policy, site marked. Will monitor for blood return.

## 2017-07-02 NOTE — Progress Notes (Signed)
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 Widespread hepatic metastasis, without abdominal primary identified. 2. Right twelfth rib metastasis. 3. Soft tissue fullness within the cecum, not entirely imaged. This could represent prominent stool or less likely a colon primary. Consider dedicated chest and pelvic CT or PET to evaluate for primary. 4. Suspicious right cardiophrenic angle adenopathy. Indeterminate/equivocal portal caval node. 5. Left adrenal adenoma. These results will be called to the ordering clinician or representative by the Radiologist Assistant, and communication documented in the PACS or zVision Dashboard.      12/24/2016 Procedure    Upper Endoscopy Nodular distal gastritis, biospied.      12/24/2016 Procedure    Colonoscopy  - The entire examined colon is normal on direct and retroflexion views. - No polyps or cancers.      12/24/2016 Pathology Results     Diagnosis Stomach, biopsy, distal - REACTIVE GASTROPATHY - NO MALIGNANCY IDENTIFIED      12/29/2016 Imaging    CT CAP w contrast  IMPRESSION: 1. No definite CT findings to suggest primary source of the apparent hepatic metastases. Gallbladder is  contracted and there is some wall irregularity in the body of the gallbladder but no gross gallbladder mass lesion is evident. No biliary dilatation. Previous MRI raise the question of the cecal lesion, but there is no evidence for gross cecal mass by CT today. There is some minimal fullness in the distal stomach, but this is a fairly typical appearance on CT and given the smooth appearance of the distal gastric wall and symmetric tissue fullness, neoplasm is felt to be not likely. 2. Hepatic metastases as seen on recent abdominal MRI. 3. Borderline to mild gastrohepatic, hepatoduodenal ligament, and retroperitoneal lymphadenopathy.  4. PET-CT may prove helpful to further evaluate.      01/01/2017 Imaging    US Biopsy  IMPRESSION: Status post ultrasound-guided biopsy of right liver tumor. Tissue specimen sent to pathology for complete histopathologic analysis.      01/01/2017 Procedure    Ultrasound-guided liver biopsy      01/01/2017 Pathology Results    Liver biopsy showed adenocarcinoma, consistent with cholangiocarcinoma.       01/01/2017 Initial Diagnosis    Cholangiocarcinoma metastatic to liver ([place])      01/27/2017 Imaging    NM PET scan IMPRESSION: Dominant geographic mass in the right hepatic lobe is heterogeneous but hypermetabolic with maximum SUV of 7.5. Likely metastatic hypermetabolic foci are present primarily in the right hepatic lobe but also involving the medial and lateral segments of the left hepatic lobe. Small lesion medially in the right twelfth rib subcostal space with very low metabolic activity with maximum SUV of 1.9, previously seen on the prior MRI of 12/17/2016. I am skeptical that this is a metastatic lesion. This  could well be a schwannoma or small vascular malformation. This does appear increased in size compared to 6/20 12/2012, and merits surveillance. Mild splenomegaly but without focal splenic hypermetabolic activity. 2.0 by 1.6 cm  hypodense right thyroid mass is mildly hypermetabolic. A significant minority of such lesions can turn out to represent thyroid cancer. Thyroid sonography is recommended for further characterization. Other imaging findings of potential clinical significance: Suspected cholelithiasis. Aortic Atherosclerosis (ICD10-I70.0).      02/12/2017 -  Chemotherapy    first line chemo cisplatin and gemcitabine. chemotherapy q week x 2 weeks with 1 week off starting 02/12/17      05/05/2017 Imaging    CT CAP W Contrast 05/05/17  IMPRESSION: 1. Large infiltrative primary tumor centered in the anterior right liver lobe is mildly decreased in size. Numerous small hepatic metastases are stable in size and demonstrate decreased capsular enhancement, suggesting treatment effect. 2. No new or progressive metastatic disease in the chest, abdomen or pelvis. 3. Stable indeterminate small soft tissue mass inferior to the medial right twelfth rib, which was non hypermetabolic on 10/62/6948 PET-CT, unlikely to be a metastasis. Schwannoma or small vascular malformation was suggested on 01/27/2017 PET-CT report. 4. Stable 1.6 cm right thyroid lobe nodule, which was hypermetabolic on 54/62/7035 PET-CT . Correlation with thyroid ultrasound may be performed as clinically warranted. 5. Chronic findings include: Cholelithiasis. Stable left adrenal adenoma. Chronic calcified thrombus in the left external iliac and left common femoral veins.       HISTORY OF PRESENTING ILLNESS (01/12/17):  Cheryl [name] 61 y.o. female is here because of newly diagnosed Cholangiocarcinoma. She was referred by her gastroenterologist Dr. Ardis Hughs. She presents to my clinic with her husband today.  She has had vague, intermittent abdominal pain, and a total of 10 pound weight loss since December. She was originally everted by her primary care physician, and lab test showed elevated liver function tests. She underwent abdominal MRI with and  without contrast on 12/17/2016, which showed wide spread hepatic metastasis, without abdominal primary identified. Soft tissue fullness within the cecum, indeterminate. She was referred to Dr. Ardis Hughs, and had negative colonoscopy and endoscopy on 12/24/2016. She underwent ultrasound guided liver biopsy on 01/01/2017, pathology showed adenocarcinoma, consistent with glandular carcinoma.   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   She reports some pain that started worsened days after her biopsy. She has nausea commonly. She is eating well. She reports fatigue and constipation and she takes [place] to help. Her bowel movements are about every 3 days. She denies chest pain, cough or SOB. She does mammograms normally with her most recent one being in June 2017.  She denies any history of cancer.   CURRENT THERAPY: first line chemo cisplatin and gemcitabine. chemotherapy q week x 2 weeks with 1 week off starting 02/12/17, gemcitabine dose reduction to 700mg /m2 from 05/07/2017 due to severe thrombocytopenia, reduce gemcitabine to 600mg /m2 starting with cycle 9 on 12/7 due to thrombocytopenia.    INTERVAL HISTORY:   Cheryl [name] returns to the clinic today for follow up. She presents to the infusion room noting she overall feels well. Her abdominal pain has much improved. She has set consultation with her new oncologist and will see them in 07/19/16.    MEDICAL HISTORY:  Past Medical History:  Diagnosis Date  . History of blood clots   . Liver mass   . PONV (postoperative nausea and vomiting)    nausea   SURGICAL HISTORY: Past  Surgical History:  Procedure Laterality Date  . ABDOMINAL HYSTERECTOMY     complete  . CESAREAN SECTION     x 1  . COLONOSCOPY WITH PROPOFOL N/A 12/24/2016   Procedure: COLONOSCOPY WITH PROPOFOL;  Surgeon: Milus Banister, MD;  Location: [facility];  Service: Endoscopy;  Laterality: N/A;  . ESOPHAGOGASTRODUODENOSCOPY (EGD) WITH PROPOFOL N/A  12/24/2016   Procedure: ESOPHAGOGASTRODUODENOSCOPY (EGD) WITH PROPOFOL;  Surgeon: Milus Banister, MD;  Location: [facility];  Service: Endoscopy;  Laterality: N/A;  . IR FLUORO GUIDE PORT INSERTION RIGHT  03/09/2017  . IR US GUIDE VASC ACCESS RIGHT  03/09/2017   SOCIAL HISTORY: Social History   Socioeconomic History  . Marital status: Married    Spouse name: Not on file  . Number of children: 1  . Years of education: Not on file  . Highest education level: Not on file  Social Needs  . Financial resource strain: Not on file  . Food insecurity - worry: Not on file  . Food insecurity - inability: Not on file  . Transportation needs - medical: Not on file  . Transportation needs - non-medical: Not on file  Occupational History  . Not on file  Tobacco Use  . Smoking status: Former Smoker    Packs/day: 1.00    Years: 15.00    Pack years: 15.00    Types: Cigarettes  . Smokeless tobacco: Never Used  . Tobacco comment: quit 2010  Substance and Sexual Activity  . Alcohol use: No  . Drug use: No  . Sexual activity: Not on file  Other Topics Concern  . Not on file  Social History Narrative  . Not on file   FAMILY HISTORY: Family History  Problem Relation Age of Onset  . Stomach cancer Mother   . Emphysema Father    ALLERGIES:  is allergic to dilaudid [hydromorphone hcl]; sulfa antibiotics; and tramadol.  MEDICATIONS:  Current Outpatient Medications  Medication Sig Dispense Refill  . acetaminophen (TYLENOL) 500 MG tablet Take 1,000 mg by mouth every 6 (six) hours as needed (for pain.).    Marland Kitchen escitalopram (LEXAPRO) 10 MG tablet Take 1 tablet (10 mg total) by mouth daily. 30 tablet 2  . lidocaine-prilocaine (EMLA) cream Apply 1 application topically as needed. 30 g 2  . ondansetron (ZOFRAN) 8 MG tablet Take 1 tablet (8 mg total) by mouth 2 (two) times daily as needed. Start on the third day after chemotherapy. (Patient not taking: Reported on 06/18/2017) 30 tablet 1  .  prochlorperazine (COMPAZINE) 10 MG tablet Take 1 tablet (10 mg total) by mouth every 6 (six) hours as needed (Nausea or vomiting). (Patient not taking: Reported on 06/18/2017) 30 tablet 1  . traMADol (ULTRAM) 50 MG tablet Take 1 tablet (50 mg total) by mouth every 6 (six) hours as needed. 30 tablet 1  . warfarin (COUMADIN) 4 MG tablet Take 4 mg by mouth daily at 6 PM.      No current facility-administered medications for this visit.    Facility-Administered Medications Ordered in Other Visits  Medication Dose Route Frequency Provider Last Rate Last Dose  . sodium chloride flush (NS) 0.9 % injection 10 mL  10 mL Intravenous PRN Truitt Merle, MD   10 mL at 06/01/17 1000    REVIEW OF SYSTEMS:  Constitutional: Denies fevers, chills or abnormal night sweats  Eyes: Denies blurriness of vision, double vision or watery eyes Ears, nose, mouth, throat, and face: Denies mucositis or sore throat Respiratory: Denies cough, dyspnea  or wheezes Cardiovascular: Denies palpitation, chest discomfort or lower extremity swelling Gastrointestinal:  Denies heartburn or change in bowel habits, (+) nausea, controlled Skin: Denies abnormal skin rashes Lymphatics: Denies new lymphadenopathy or easy bruising Neurological:Denies numbness, tingling or new weaknesses Behavioral/Psych: (+) depression, controlled MSK: Denies arthralgias.  All other systems were reviewed with the patient and are negative.  PHYSICAL EXAMINATION:  ECOG PERFORMANCE STATUS: 1 - Symptomatic but completely ambulatory Weight 139 pounds, blood pressure 136/53, heart rate 70, respiratory rate 18, temperature 37.1 pulse ox 100% on room air GENERAL:alert, no distress and comfortable SKIN: skin color, texture, turgor are normal, no rashes or significant lesions EYES: normal, conjunctiva are pink and non-injected, sclera clear OROPHARYNX:no exudate, no erythema and lips, buccal mucosa, and tongue normal  NECK: supple, thyroid normal size, non-tender,  without nodularity LYMPH:  no palpable lymphadenopathy in the cervical, axillary or inguinal LUNGS: clear to auscultation and percussion with normal breathing effort HEART: regular rate & rhythm and no murmurs and no lower extremity edema ABDOMEN:abdomen soft, non-tender and normal bowel sounds. Enlarged liver to 4 cm and tender below the ribcage. Musculoskeletal:no cyanosis of digits and no clubbing  PSYCH: alert & oriented x 3 with fluent speech NEURO: no focal motor/sensory deficits  LABORATORY DATA:  I have reviewed the data as listed CBC Latest Ref Rng & Units 07/02/2017 06/18/2017 06/11/2017  WBC 3.9 - 10.3 10e3/uL 8.5 3.8(L) 5.4  Hemoglobin 11.6 - 15.9 g/dL 11.6 10.9(L) 10.9(L)  Hematocrit 34.8 - 46.6 % 35.6 33.2(L) 33.5(L)  Platelets 145 - 400 10e3/uL 90(L) 105(L) 186   CMP Latest Ref Rng & Units 07/02/2017 06/18/2017 06/11/2017  Glucose 70 - 140 mg/dl 77 77 75  BUN 7.0 - 26.0 mg/dL 17.9 15.9 14.6  Creatinine 0.6 - 1.1 mg/dL 0.8 0.7 0.7  Sodium 136 - 145 mEq/L 141 141 141  Potassium 3.5 - 5.1 mEq/L 4.3 4.1 4.3  Chloride 101 - 111 mmol/L - - -  CO2 22 - 29 mEq/L 28 24 23   Calcium 8.4 - 10.4 mg/dL 9.4 9.1 8.8  Total Protein 6.4 - 8.3 g/dL 7.1 6.9 7.0  Total Bilirubin 0.20 - 1.20 mg/dL 0.35 0.48 0.39  Alkaline Phos 40 - 150 U/L 149 114 116  AST 5 - 34 U/L 24 29 25   ALT 0 - 55 U/L 23 42 18    CA 19.9:   01/22/17:  44. 02/12/17: 46 02/19/17: 44 03/05/17: 42 03/12/17: 42 03/26/17: 36 04/02/17: 40 04/23/17: 45 04/30/17: 45 05/07/17: 46 05/10/17: 42 05/21/17: 43 06/01/17: 38 06/11/17 : 41 06/18/17: 40 07/02/17: PENDING     PATHOLOGY: Diagnosis 12/24/2016 Stomach, biopsy, distal - REACTIVE GASTROPATHY - NO MALIGNANCY IDENTIFIED  Diagnosis 01/01/2017 Liver, needle/core biopsy ADENOCARCINOMA, CONSISTENT WITH CHOLANGIOCARCINOMA.  RADIOGRAPHIC STUDIES: I have personally reviewed the radiological images as listed and agreed with the findings in the report. No results  found.   CT CAP W Contrast 05/05/17  IMPRESSION: 1. Large infiltrative primary tumor centered in the anterior right liver lobe is mildly decreased in size. Numerous small hepatic metastases are stable in size and demonstrate decreased capsular enhancement, suggesting treatment effect. 2. No new or progressive metastatic disease in the chest, abdomen or pelvis. 3. Stable indeterminate small soft tissue mass inferior to the medial right twelfth rib, which was non hypermetabolic on 33/29/5188 PET-CT, unlikely to be a metastasis. Schwannoma or small vascular malformation was suggested on 01/27/2017 PET-CT report. 4. Stable 1.6 cm right thyroid lobe nodule, which was hypermetabolic on 41/66/0630 PET-CT . Correlation  with thyroid ultrasound may be performed as clinically warranted. 5. Chronic findings include: Cholelithiasis. Stable left adrenal adenoma. Chronic calcified thrombus in the left external iliac and left common femoral veins.    ASSESSMENT & PLAN: Cheryl [name] is a  61 y.o. female, with past medical history of DVT on Coumadin, presented with intermittent abdominal pain, and 10 pound weight loss.  1. 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 -Her liver biopsy confirmed adenocarcinoma, consistent with cholangiocarcinoma. Her EGD and endoscopy was negative for primary tumor. CT chest, abdomen and pelvis was negative for other primary or metastasis. -Baseline CA19.9 was elevated at 44 on 01/22/17 -Her case was previously reviewed in our GI tumor Board, the T12 metastasis reported by MRI was not highly suspicious for bone metastasis, after we reviewed in the tumor board. -I reviewed her PET scan images with patient in person, unfortunately he has extensive liver metastasis, including metastatic lesion in the left lobe, no other distant  metastasis. -I have discussed with Surgeon Dr. Barry Dienes who doesn't think that surgery resection is feasible due to left lobe liver metastasis.  -I previously recommended her to start systemic chemotherapy to control her cancer. The goal of therapy is palliative, to prolong her life and reserve quality of life. -she has started first line chemo cisplatin and gemcitabine on 02/12/17, tolerating well so far -Pt plans to move [facility] in [facility], I previously sent referral to Med Onc in [place].  She has an appointment with her new oncologist on January 14. -We previously reviewed her restaging CT from 05/05/17 shows the primary tumor has mildly decreased in size, partial response to chemotherapy. -Previously, I reduced her Gemcitabine dose to 600mg /m2 starting 05/14/17 due to her low platelets  -labs reviewed and her plt are 90K. She will not receive chemo today, She will have lab in 1 week and if adequate will get treatment C7D1 on 1/5. Other labs WNL overall. -She is fine to stay on Coumadin unless she drops to 50K.  -repeat staging scan in 07/2017 at her new oncologist's office  -F/u in 2 weeks     2. Constipation - I previously encouraged her to drink more water or try prune juice, uses stool softener and laxative as needed. -Improved and manageable   3. Right flank pain - She has been trying Tylenol once daily - Previously, I gave her a prescription of Tramadol. She should try to eat before taking to avoid nausea. -pain overall much improved   4. Nausea - I previously prescribed compazine for nausea.  - Patient still has a good appetite.  -Nausea is mild and manageable   5. Depression  -Was previously on Zoloft, stopped this after one month d/t it not helping her.  -I started her on 10mg  Lexapro daily  -Pt previously reported to doing much better on Lexapro, will continue  -Previously, pt does not think she was on Lexapro for long enough but discontinued it on her own after 1  month. She still has some anxiety and depression, she will restart Lexapro if after 6-8 weeks if she does not notice improvement in her mood or has side effects, consider dosage or medication change. -She feels better with Lexapro, depression controlled   6. Mild anemia -Secondary to chemotherapy, overall very mild, stable, continue observation -Consider blood transfusion if hemoglobin less than 8 -Hg 10.0 today (  06/01/17), no need for blood transfusion.   7.  Severe thrombocytopenia, secondary to chemotherapy -I have reduced her gemcitabine chemotherapy dosage   8. Goal of care discussion  -We again discussed the incurable nature of her cancer, and the overall poor prognosis, especially if she does not have good response to chemotherapy or progress on chemo -The patient understands the goal of care is palliative. -I previously recommended DNR/DNI, she will think about it    PLAN  -Labs reviewed, plt at 90K, will hold chemo today  -She has appointment with her new oncologist in [place] on January 14, I recommended her to have restaging CT scan over there in January 2019  -She will return for lab and cycle 7-day 1 chemo in 1 week -F/u in 2 weeks with labs (her last treatment here)    No orders of the defined types were placed in this encounter.  All questions were answered. The patient knows to call the clinic with any problems, questions or concerns.  I spent 15 minutes counseling the patient face to face. The total time spent in the appointment was 20 minutes and more than 50% was on counseling.   This document serves as a record of services personally performed by Truitt Merle, MD. It was created on her behalf by Joslyn Devon, a trained medical scribe. The creation of this record is based on the scribe's personal observations and the provider's statements to them.    I have reviewed the above documentation for accuracy and completeness, and I agree with the above.      Truitt Merle, MD 07/02/2017

## 2017-07-02 NOTE — Patient Instructions (Signed)
Implanted Port Home Guide An implanted port is a type of central line that is placed under the skin. Central lines are used to provide IV access when treatment or nutrition needs to be given through a person's veins. Implanted ports are used for long-term IV access. An implanted port may be placed because:  You need IV medicine that would be irritating to the small veins in your hands or arms.  You need long-term IV medicines, such as antibiotics.  You need IV nutrition for a long period.  You need frequent blood draws for lab tests.  You need dialysis.  Implanted ports are usually placed in the chest area, but they can also be placed in the upper arm, the abdomen, or the leg. An implanted port has two main parts:  Reservoir. The reservoir is round and will appear as a small, raised area under your skin. The reservoir is the part where a needle is inserted to give medicines or draw blood.  Catheter. The catheter is a thin, flexible tube that extends from the reservoir. The catheter is placed into a large vein. Medicine that is inserted into the reservoir goes into the catheter and then into the vein.  How will I care for my incision site? Do not get the incision site wet. Bathe or shower as directed by your health care provider. How is my port accessed? Special steps must be taken to access the port:  Before the port is accessed, a numbing cream can be placed on the skin. This helps numb the skin over the port site.  Your health care provider uses a sterile technique to access the port. ? Your health care provider must put on a mask and sterile gloves. ? The skin over your port is cleaned carefully with an antiseptic and allowed to dry. ? The port is gently pinched between sterile gloves, and a needle is inserted into the port.  Only "non-coring" port needles should be used to access the port. Once the port is accessed, a blood return should be checked. This helps ensure that the port  is in the vein and is not clogged.  If your port needs to remain accessed for a constant infusion, a clear (transparent) bandage will be placed over the needle site. The bandage and needle will need to be changed every week, or as directed by your health care provider.  Keep the bandage covering the needle clean and dry. Do not get it wet. Follow your health care provider's instructions on how to take a shower or bath while the port is accessed.  If your port does not need to stay accessed, no bandage is needed over the port.  What is flushing? Flushing helps keep the port from getting clogged. Follow your health care provider's instructions on how and when to flush the port. Ports are usually flushed with saline solution or a medicine called heparin. The need for flushing will depend on how the port is used.  If the port is used for intermittent medicines or blood draws, the port will need to be flushed: ? After medicines have been given. ? After blood has been drawn. ? As part of routine maintenance.  If a constant infusion is running, the port may not need to be flushed.  How long will my port stay implanted? The port can stay in for as long as your health care provider thinks it is needed. When it is time for the port to come out, surgery will be   done to remove it. The procedure is similar to the one performed when the port was put in. When should I seek immediate medical care? When you have an implanted port, you should seek immediate medical care if:  You notice a bad smell coming from the incision site.  You have swelling, redness, or drainage at the incision site.  You have more swelling or pain at the port site or the surrounding area.  You have a fever that is not controlled with medicine.  This information is not intended to replace advice given to you by your health care provider. Make sure you discuss any questions you have with your health care provider. Document  Released: 06/22/2005 Document Revised: 11/28/2015 Document Reviewed: 02/27/2013 Elsevier Interactive Patient Education  2017 [employer]vier Inc.  

## 2017-07-02 NOTE — Progress Notes (Signed)
Pt's ptl count 90,000. Per Dr. Burr Medico, no treatment today; Pt to return in 1 week. Pt verbalized understanding.

## 2017-07-03 DIAGNOSIS — D6959 Other secondary thrombocytopenia: Secondary | ICD-10-CM | POA: Insufficient documentation

## 2017-07-03 DIAGNOSIS — T451X5A Adverse effect of antineoplastic and immunosuppressive drugs, initial encounter: Secondary | ICD-10-CM

## 2017-07-03 LAB — CANCER ANTIGEN 19-9: CAN 19-9: 44 U/mL — AB (ref 0–35)

## 2017-07-09 ENCOUNTER — Ambulatory Visit: Payer: BLUE CROSS/BLUE SHIELD

## 2017-07-09 ENCOUNTER — Other Ambulatory Visit (HOSPITAL_BASED_OUTPATIENT_CLINIC_OR_DEPARTMENT_OTHER): Payer: BLUE CROSS/BLUE SHIELD

## 2017-07-09 ENCOUNTER — Other Ambulatory Visit: Payer: Self-pay | Admitting: Hematology

## 2017-07-09 DIAGNOSIS — C787 Secondary malignant neoplasm of liver and intrahepatic bile duct: Secondary | ICD-10-CM | POA: Diagnosis not present

## 2017-07-09 DIAGNOSIS — C221 Intrahepatic bile duct carcinoma: Secondary | ICD-10-CM

## 2017-07-09 DIAGNOSIS — Z95828 Presence of other vascular implants and grafts: Secondary | ICD-10-CM

## 2017-07-09 LAB — CBC WITH DIFFERENTIAL/PLATELET
BASO%: 0.4 % (ref 0.0–2.0)
BASOS ABS: 0 10*3/uL (ref 0.0–0.1)
EOS ABS: 0.2 10*3/uL (ref 0.0–0.5)
EOS%: 2 % (ref 0.0–7.0)
HEMATOCRIT: 37.1 % (ref 34.8–46.6)
HGB: 12.5 g/dL (ref 11.6–15.9)
LYMPH%: 28.6 % (ref 14.0–49.7)
MCH: 33.2 pg (ref 25.1–34.0)
MCHC: 33.7 g/dL (ref 31.5–36.0)
MCV: 98.4 fL (ref 79.5–101.0)
MONO#: 0.7 10*3/uL (ref 0.1–0.9)
MONO%: 9.1 % (ref 0.0–14.0)
NEUT%: 59.9 % (ref 38.4–76.8)
NEUTROS ABS: 4.4 10*3/uL (ref 1.5–6.5)
NRBC: 0 % (ref 0–0)
PLATELETS: 130 10*3/uL — AB (ref 145–400)
RBC: 3.77 10*6/uL (ref 3.70–5.45)
RDW: 16.8 % — ABNORMAL HIGH (ref 11.2–14.5)
WBC: 7.3 10*3/uL (ref 3.9–10.3)
lymph#: 2.1 10*3/uL (ref 0.9–3.3)

## 2017-07-09 LAB — COMPREHENSIVE METABOLIC PANEL
ALT: 30 U/L (ref 0–55)
ANION GAP: 7 meq/L (ref 3–11)
AST: 30 U/L (ref 5–34)
Albumin: 3.9 g/dL (ref 3.5–5.0)
Alkaline Phosphatase: 144 U/L (ref 40–150)
BUN: 19.3 mg/dL (ref 7.0–26.0)
CALCIUM: 9.4 mg/dL (ref 8.4–10.4)
CHLORIDE: 107 meq/L (ref 98–109)
CO2: 28 meq/L (ref 22–29)
CREATININE: 0.8 mg/dL (ref 0.6–1.1)
EGFR: >60 mL/min/{1.73_m2} (ref >60)
Glucose: 71 mg/dl (ref 70–140)
POTASSIUM: 4.4 meq/L (ref 3.5–5.1)
Sodium: 141 mEq/L (ref 136–145)
Total Bilirubin: 0.44 mg/dL (ref 0.20–1.20)
Total Protein: 7.3 g/dL (ref 6.4–8.3)

## 2017-07-09 MED ORDER — SODIUM CHLORIDE 0.9% FLUSH
10.0000 mL | INTRAVENOUS | Status: DC | PRN
  Filled 2017-07-09: qty 10

## 2017-07-10 ENCOUNTER — Ambulatory Visit (HOSPITAL_BASED_OUTPATIENT_CLINIC_OR_DEPARTMENT_OTHER): Payer: BLUE CROSS/BLUE SHIELD

## 2017-07-10 VITALS — BP 126/85 | HR 75 | Temp 98.0°F | Resp 17

## 2017-07-10 DIAGNOSIS — C787 Secondary malignant neoplasm of liver and intrahepatic bile duct: Secondary | ICD-10-CM

## 2017-07-10 DIAGNOSIS — Z5111 Encounter for antineoplastic chemotherapy: Secondary | ICD-10-CM

## 2017-07-10 DIAGNOSIS — C221 Intrahepatic bile duct carcinoma: Secondary | ICD-10-CM

## 2017-07-10 MED ORDER — HEPARIN SOD (PORK) LOCK FLUSH 100 UNIT/ML IV SOLN
500.0000 [IU] | Freq: Once | INTRAVENOUS | Status: AC | PRN
  Administered 2017-07-10: 500 [IU]
  Filled 2017-07-10: qty 5

## 2017-07-10 MED ORDER — PALONOSETRON HCL INJECTION 0.25 MG/5ML
0.2500 mg | Freq: Once | INTRAVENOUS | Status: AC
  Administered 2017-07-10: 0.25 mg via INTRAVENOUS

## 2017-07-10 MED ORDER — SODIUM CHLORIDE 0.9 % IV SOLN
400.0000 mg/m2 | Freq: Once | INTRAVENOUS | Status: AC
  Administered 2017-07-10: 684 mg via INTRAVENOUS
  Filled 2017-07-10: qty 17.99

## 2017-07-10 MED ORDER — SODIUM CHLORIDE 0.9% FLUSH
10.0000 mL | INTRAVENOUS | Status: DC | PRN
  Administered 2017-07-10: 10 mL
  Filled 2017-07-10: qty 10

## 2017-07-10 MED ORDER — SODIUM CHLORIDE 0.9 % IV SOLN
25.0000 mg/m2 | Freq: Once | INTRAVENOUS | Status: AC
  Administered 2017-07-10: 43 mg via INTRAVENOUS
  Filled 2017-07-10: qty 43

## 2017-07-10 MED ORDER — SODIUM CHLORIDE 0.9 % IV SOLN
Freq: Once | INTRAVENOUS | Status: AC
  Administered 2017-07-10: 08:00:00 via INTRAVENOUS

## 2017-07-10 MED ORDER — POTASSIUM CHLORIDE 2 MEQ/ML IV SOLN
Freq: Once | INTRAVENOUS | Status: AC
  Administered 2017-07-10: 08:00:00 via INTRAVENOUS
  Filled 2017-07-10: qty 10

## 2017-07-10 MED ORDER — PALONOSETRON HCL INJECTION 0.25 MG/5ML
INTRAVENOUS | Status: AC
  Filled 2017-07-10: qty 5

## 2017-07-10 MED ORDER — SODIUM CHLORIDE 0.9 % IV SOLN
Freq: Once | INTRAVENOUS | Status: AC
  Administered 2017-07-10: 11:00:00 via INTRAVENOUS
  Filled 2017-07-10: qty 5

## 2017-07-10 NOTE — Patient Instructions (Signed)
[facility] Discharge Instructions for Patients Receiving Chemotherapy  Today you received the following chemotherapy agents Gemzar and Cisplatin  To help prevent nausea and vomiting after your treatment, we encourage you to take your nausea medication as directed   If you develop nausea and vomiting that is not controlled by your nausea medication, call the clinic.   BELOW ARE SYMPTOMS THAT SHOULD BE REPORTED IMMEDIATELY:  *FEVER GREATER THAN 100.5 F  *CHILLS WITH OR WITHOUT FEVER  NAUSEA AND VOMITING THAT IS NOT CONTROLLED WITH YOUR NAUSEA MEDICATION  *UNUSUAL SHORTNESS OF BREATH  *UNUSUAL BRUISING OR BLEEDING  TENDERNESS IN MOUTH AND THROAT WITH OR WITHOUT PRESENCE OF ULCERS  *URINARY PROBLEMS  *BOWEL PROBLEMS  UNUSUAL RASH Items with * indicate a potential emergency and should be followed up as soon as possible.  Feel free to call the clinic should you have any questions or concerns. The clinic phone number is [phone].  Please show the CHEMO ALERT CARD at check-in to the Emergency Department and triage nurse.   

## 2017-07-15 DIAGNOSIS — Z86718 Personal history of other venous thrombosis and embolism: Secondary | ICD-10-CM | POA: Diagnosis not present

## 2017-07-15 DIAGNOSIS — Z7901 Long term (current) use of anticoagulants: Secondary | ICD-10-CM | POA: Diagnosis not present

## 2017-07-19 DIAGNOSIS — D473 Essential (hemorrhagic) thrombocythemia: Secondary | ICD-10-CM | POA: Diagnosis not present

## 2017-07-19 DIAGNOSIS — C23 Malignant neoplasm of gallbladder: Secondary | ICD-10-CM | POA: Diagnosis not present

## 2017-07-19 DIAGNOSIS — C221 Intrahepatic bile duct carcinoma: Secondary | ICD-10-CM | POA: Diagnosis not present

## 2017-07-21 DIAGNOSIS — R911 Solitary pulmonary nodule: Secondary | ICD-10-CM | POA: Diagnosis not present

## 2017-07-21 DIAGNOSIS — C221 Intrahepatic bile duct carcinoma: Secondary | ICD-10-CM | POA: Diagnosis not present

## 2017-07-29 DIAGNOSIS — C23 Malignant neoplasm of gallbladder: Secondary | ICD-10-CM | POA: Diagnosis not present

## 2017-07-29 DIAGNOSIS — Z885 Allergy status to narcotic agent status: Secondary | ICD-10-CM | POA: Diagnosis not present

## 2017-07-29 DIAGNOSIS — Z800 Family history of malignant neoplasm of digestive organs: Secondary | ICD-10-CM | POA: Diagnosis not present

## 2017-07-29 DIAGNOSIS — Z87891 Personal history of nicotine dependence: Secondary | ICD-10-CM | POA: Diagnosis not present

## 2017-07-29 DIAGNOSIS — Z5111 Encounter for antineoplastic chemotherapy: Secondary | ICD-10-CM | POA: Diagnosis not present

## 2017-07-29 DIAGNOSIS — C221 Intrahepatic bile duct carcinoma: Secondary | ICD-10-CM | POA: Diagnosis not present

## 2017-07-29 DIAGNOSIS — Z882 Allergy status to sulfonamides status: Secondary | ICD-10-CM | POA: Diagnosis not present

## 2017-07-29 DIAGNOSIS — Z79899 Other long term (current) drug therapy: Secondary | ICD-10-CM | POA: Diagnosis not present

## 2017-08-05 DIAGNOSIS — Z882 Allergy status to sulfonamides status: Secondary | ICD-10-CM | POA: Diagnosis not present

## 2017-08-05 DIAGNOSIS — Z79899 Other long term (current) drug therapy: Secondary | ICD-10-CM | POA: Diagnosis not present

## 2017-08-05 DIAGNOSIS — Z885 Allergy status to narcotic agent status: Secondary | ICD-10-CM | POA: Diagnosis not present

## 2017-08-05 DIAGNOSIS — Z87891 Personal history of nicotine dependence: Secondary | ICD-10-CM | POA: Diagnosis not present

## 2017-08-05 DIAGNOSIS — Z5111 Encounter for antineoplastic chemotherapy: Secondary | ICD-10-CM | POA: Diagnosis not present

## 2017-08-05 DIAGNOSIS — Z800 Family history of malignant neoplasm of digestive organs: Secondary | ICD-10-CM | POA: Diagnosis not present

## 2017-08-05 DIAGNOSIS — C221 Intrahepatic bile duct carcinoma: Secondary | ICD-10-CM | POA: Diagnosis not present

## 2017-08-06 DIAGNOSIS — Z79899 Other long term (current) drug therapy: Secondary | ICD-10-CM | POA: Diagnosis not present

## 2017-08-06 DIAGNOSIS — Z7901 Long term (current) use of anticoagulants: Secondary | ICD-10-CM | POA: Diagnosis not present

## 2017-08-06 DIAGNOSIS — Z885 Allergy status to narcotic agent status: Secondary | ICD-10-CM | POA: Diagnosis not present

## 2017-08-06 DIAGNOSIS — C23 Malignant neoplasm of gallbladder: Secondary | ICD-10-CM | POA: Diagnosis not present

## 2017-08-06 DIAGNOSIS — D6481 Anemia due to antineoplastic chemotherapy: Secondary | ICD-10-CM | POA: Diagnosis not present

## 2017-08-06 DIAGNOSIS — Z882 Allergy status to sulfonamides status: Secondary | ICD-10-CM | POA: Diagnosis not present

## 2017-08-06 DIAGNOSIS — Z87891 Personal history of nicotine dependence: Secondary | ICD-10-CM | POA: Diagnosis not present

## 2017-08-06 DIAGNOSIS — Z800 Family history of malignant neoplasm of digestive organs: Secondary | ICD-10-CM | POA: Diagnosis not present

## 2017-08-06 DIAGNOSIS — Z5111 Encounter for antineoplastic chemotherapy: Secondary | ICD-10-CM | POA: Diagnosis not present

## 2017-08-06 DIAGNOSIS — C221 Intrahepatic bile duct carcinoma: Secondary | ICD-10-CM | POA: Diagnosis not present

## 2017-08-06 DIAGNOSIS — D696 Thrombocytopenia, unspecified: Secondary | ICD-10-CM | POA: Diagnosis not present

## 2017-08-19 DIAGNOSIS — C23 Malignant neoplasm of gallbladder: Secondary | ICD-10-CM | POA: Diagnosis not present

## 2017-08-19 DIAGNOSIS — Z7901 Long term (current) use of anticoagulants: Secondary | ICD-10-CM | POA: Diagnosis not present

## 2017-08-19 DIAGNOSIS — Z79899 Other long term (current) drug therapy: Secondary | ICD-10-CM | POA: Diagnosis not present

## 2017-08-19 DIAGNOSIS — D6481 Anemia due to antineoplastic chemotherapy: Secondary | ICD-10-CM | POA: Diagnosis not present

## 2017-08-19 DIAGNOSIS — Z87891 Personal history of nicotine dependence: Secondary | ICD-10-CM | POA: Diagnosis not present

## 2017-08-19 DIAGNOSIS — Z5111 Encounter for antineoplastic chemotherapy: Secondary | ICD-10-CM | POA: Diagnosis not present

## 2017-08-19 DIAGNOSIS — C221 Intrahepatic bile duct carcinoma: Secondary | ICD-10-CM | POA: Diagnosis not present

## 2017-08-19 DIAGNOSIS — Z885 Allergy status to narcotic agent status: Secondary | ICD-10-CM | POA: Diagnosis not present

## 2017-08-19 DIAGNOSIS — D696 Thrombocytopenia, unspecified: Secondary | ICD-10-CM | POA: Diagnosis not present

## 2017-08-19 DIAGNOSIS — Z882 Allergy status to sulfonamides status: Secondary | ICD-10-CM | POA: Diagnosis not present

## 2017-08-19 DIAGNOSIS — Z800 Family history of malignant neoplasm of digestive organs: Secondary | ICD-10-CM | POA: Diagnosis not present

## 2017-08-25 DIAGNOSIS — Z885 Allergy status to narcotic agent status: Secondary | ICD-10-CM | POA: Diagnosis not present

## 2017-08-25 DIAGNOSIS — D6481 Anemia due to antineoplastic chemotherapy: Secondary | ICD-10-CM | POA: Diagnosis not present

## 2017-08-25 DIAGNOSIS — Z5111 Encounter for antineoplastic chemotherapy: Secondary | ICD-10-CM | POA: Diagnosis not present

## 2017-08-25 DIAGNOSIS — C221 Intrahepatic bile duct carcinoma: Secondary | ICD-10-CM | POA: Diagnosis not present

## 2017-08-25 DIAGNOSIS — Z7901 Long term (current) use of anticoagulants: Secondary | ICD-10-CM | POA: Diagnosis not present

## 2017-08-25 DIAGNOSIS — Z882 Allergy status to sulfonamides status: Secondary | ICD-10-CM | POA: Diagnosis not present

## 2017-08-25 DIAGNOSIS — Z87891 Personal history of nicotine dependence: Secondary | ICD-10-CM | POA: Diagnosis not present

## 2017-08-25 DIAGNOSIS — C23 Malignant neoplasm of gallbladder: Secondary | ICD-10-CM | POA: Diagnosis not present

## 2017-08-25 DIAGNOSIS — Z800 Family history of malignant neoplasm of digestive organs: Secondary | ICD-10-CM | POA: Diagnosis not present

## 2017-08-25 DIAGNOSIS — D696 Thrombocytopenia, unspecified: Secondary | ICD-10-CM | POA: Diagnosis not present

## 2017-08-25 DIAGNOSIS — Z79899 Other long term (current) drug therapy: Secondary | ICD-10-CM | POA: Diagnosis not present

## 2017-08-26 DIAGNOSIS — Z87891 Personal history of nicotine dependence: Secondary | ICD-10-CM | POA: Diagnosis not present

## 2017-08-26 DIAGNOSIS — Z5111 Encounter for antineoplastic chemotherapy: Secondary | ICD-10-CM | POA: Diagnosis not present

## 2017-08-26 DIAGNOSIS — Z882 Allergy status to sulfonamides status: Secondary | ICD-10-CM | POA: Diagnosis not present

## 2017-08-26 DIAGNOSIS — Z79899 Other long term (current) drug therapy: Secondary | ICD-10-CM | POA: Diagnosis not present

## 2017-08-26 DIAGNOSIS — D696 Thrombocytopenia, unspecified: Secondary | ICD-10-CM | POA: Diagnosis not present

## 2017-08-26 DIAGNOSIS — C23 Malignant neoplasm of gallbladder: Secondary | ICD-10-CM | POA: Diagnosis not present

## 2017-08-26 DIAGNOSIS — D6481 Anemia due to antineoplastic chemotherapy: Secondary | ICD-10-CM | POA: Diagnosis not present

## 2017-08-26 DIAGNOSIS — Z885 Allergy status to narcotic agent status: Secondary | ICD-10-CM | POA: Diagnosis not present

## 2017-08-26 DIAGNOSIS — Z7901 Long term (current) use of anticoagulants: Secondary | ICD-10-CM | POA: Diagnosis not present

## 2017-08-26 DIAGNOSIS — Z800 Family history of malignant neoplasm of digestive organs: Secondary | ICD-10-CM | POA: Diagnosis not present

## 2017-08-26 DIAGNOSIS — C221 Intrahepatic bile duct carcinoma: Secondary | ICD-10-CM | POA: Diagnosis not present

## 2017-09-09 DIAGNOSIS — D696 Thrombocytopenia, unspecified: Secondary | ICD-10-CM | POA: Diagnosis not present

## 2017-09-09 DIAGNOSIS — Z5111 Encounter for antineoplastic chemotherapy: Secondary | ICD-10-CM | POA: Diagnosis not present

## 2017-09-09 DIAGNOSIS — Z800 Family history of malignant neoplasm of digestive organs: Secondary | ICD-10-CM | POA: Diagnosis not present

## 2017-09-09 DIAGNOSIS — C23 Malignant neoplasm of gallbladder: Secondary | ICD-10-CM | POA: Diagnosis not present

## 2017-09-09 DIAGNOSIS — Z79899 Other long term (current) drug therapy: Secondary | ICD-10-CM | POA: Diagnosis not present

## 2017-09-09 DIAGNOSIS — Z882 Allergy status to sulfonamides status: Secondary | ICD-10-CM | POA: Diagnosis not present

## 2017-09-09 DIAGNOSIS — Z7901 Long term (current) use of anticoagulants: Secondary | ICD-10-CM | POA: Diagnosis not present

## 2017-09-09 DIAGNOSIS — D6481 Anemia due to antineoplastic chemotherapy: Secondary | ICD-10-CM | POA: Diagnosis not present

## 2017-09-09 DIAGNOSIS — C221 Intrahepatic bile duct carcinoma: Secondary | ICD-10-CM | POA: Diagnosis not present

## 2017-09-09 DIAGNOSIS — Z87891 Personal history of nicotine dependence: Secondary | ICD-10-CM | POA: Diagnosis not present

## 2017-09-09 DIAGNOSIS — Z885 Allergy status to narcotic agent status: Secondary | ICD-10-CM | POA: Diagnosis not present

## 2017-09-13 DIAGNOSIS — Z79899 Other long term (current) drug therapy: Secondary | ICD-10-CM | POA: Diagnosis not present

## 2017-09-13 DIAGNOSIS — Z87891 Personal history of nicotine dependence: Secondary | ICD-10-CM | POA: Diagnosis not present

## 2017-09-13 DIAGNOSIS — D6481 Anemia due to antineoplastic chemotherapy: Secondary | ICD-10-CM | POA: Diagnosis not present

## 2017-09-13 DIAGNOSIS — Z800 Family history of malignant neoplasm of digestive organs: Secondary | ICD-10-CM | POA: Diagnosis not present

## 2017-09-13 DIAGNOSIS — Z885 Allergy status to narcotic agent status: Secondary | ICD-10-CM | POA: Diagnosis not present

## 2017-09-13 DIAGNOSIS — Z7901 Long term (current) use of anticoagulants: Secondary | ICD-10-CM | POA: Diagnosis not present

## 2017-09-13 DIAGNOSIS — C23 Malignant neoplasm of gallbladder: Secondary | ICD-10-CM | POA: Diagnosis not present

## 2017-09-13 DIAGNOSIS — D696 Thrombocytopenia, unspecified: Secondary | ICD-10-CM | POA: Diagnosis not present

## 2017-09-13 DIAGNOSIS — Z5111 Encounter for antineoplastic chemotherapy: Secondary | ICD-10-CM | POA: Diagnosis not present

## 2017-09-13 DIAGNOSIS — C221 Intrahepatic bile duct carcinoma: Secondary | ICD-10-CM | POA: Diagnosis not present

## 2017-09-13 DIAGNOSIS — Z882 Allergy status to sulfonamides status: Secondary | ICD-10-CM | POA: Diagnosis not present

## 2017-09-17 DIAGNOSIS — D696 Thrombocytopenia, unspecified: Secondary | ICD-10-CM | POA: Diagnosis not present

## 2017-09-17 DIAGNOSIS — Z882 Allergy status to sulfonamides status: Secondary | ICD-10-CM | POA: Diagnosis not present

## 2017-09-17 DIAGNOSIS — Z79899 Other long term (current) drug therapy: Secondary | ICD-10-CM | POA: Diagnosis not present

## 2017-09-17 DIAGNOSIS — D6481 Anemia due to antineoplastic chemotherapy: Secondary | ICD-10-CM | POA: Diagnosis not present

## 2017-09-17 DIAGNOSIS — C23 Malignant neoplasm of gallbladder: Secondary | ICD-10-CM | POA: Diagnosis not present

## 2017-09-17 DIAGNOSIS — Z5111 Encounter for antineoplastic chemotherapy: Secondary | ICD-10-CM | POA: Diagnosis not present

## 2017-09-17 DIAGNOSIS — Z87891 Personal history of nicotine dependence: Secondary | ICD-10-CM | POA: Diagnosis not present

## 2017-09-17 DIAGNOSIS — Z800 Family history of malignant neoplasm of digestive organs: Secondary | ICD-10-CM | POA: Diagnosis not present

## 2017-09-17 DIAGNOSIS — C221 Intrahepatic bile duct carcinoma: Secondary | ICD-10-CM | POA: Diagnosis not present

## 2017-09-17 DIAGNOSIS — Z7901 Long term (current) use of anticoagulants: Secondary | ICD-10-CM | POA: Diagnosis not present

## 2017-09-17 DIAGNOSIS — Z885 Allergy status to narcotic agent status: Secondary | ICD-10-CM | POA: Diagnosis not present

## 2017-09-20 DIAGNOSIS — C221 Intrahepatic bile duct carcinoma: Secondary | ICD-10-CM | POA: Diagnosis not present

## 2017-09-20 DIAGNOSIS — Z5111 Encounter for antineoplastic chemotherapy: Secondary | ICD-10-CM | POA: Diagnosis not present

## 2017-09-20 DIAGNOSIS — D696 Thrombocytopenia, unspecified: Secondary | ICD-10-CM | POA: Diagnosis not present

## 2017-09-20 DIAGNOSIS — Z7901 Long term (current) use of anticoagulants: Secondary | ICD-10-CM | POA: Diagnosis not present

## 2017-09-20 DIAGNOSIS — Z885 Allergy status to narcotic agent status: Secondary | ICD-10-CM | POA: Diagnosis not present

## 2017-09-20 DIAGNOSIS — Z882 Allergy status to sulfonamides status: Secondary | ICD-10-CM | POA: Diagnosis not present

## 2017-09-20 DIAGNOSIS — Z87891 Personal history of nicotine dependence: Secondary | ICD-10-CM | POA: Diagnosis not present

## 2017-09-20 DIAGNOSIS — D6481 Anemia due to antineoplastic chemotherapy: Secondary | ICD-10-CM | POA: Diagnosis not present

## 2017-09-20 DIAGNOSIS — C23 Malignant neoplasm of gallbladder: Secondary | ICD-10-CM | POA: Diagnosis not present

## 2017-09-20 DIAGNOSIS — Z800 Family history of malignant neoplasm of digestive organs: Secondary | ICD-10-CM | POA: Diagnosis not present

## 2017-09-20 DIAGNOSIS — Z79899 Other long term (current) drug therapy: Secondary | ICD-10-CM | POA: Diagnosis not present

## 2017-10-04 DIAGNOSIS — Z79899 Other long term (current) drug therapy: Secondary | ICD-10-CM | POA: Diagnosis not present

## 2017-10-04 DIAGNOSIS — Z5111 Encounter for antineoplastic chemotherapy: Secondary | ICD-10-CM | POA: Diagnosis not present

## 2017-10-04 DIAGNOSIS — Z7901 Long term (current) use of anticoagulants: Secondary | ICD-10-CM | POA: Diagnosis not present

## 2017-10-04 DIAGNOSIS — C23 Malignant neoplasm of gallbladder: Secondary | ICD-10-CM | POA: Diagnosis not present

## 2017-10-04 DIAGNOSIS — D696 Thrombocytopenia, unspecified: Secondary | ICD-10-CM | POA: Diagnosis not present

## 2017-10-04 DIAGNOSIS — Z885 Allergy status to narcotic agent status: Secondary | ICD-10-CM | POA: Diagnosis not present

## 2017-10-04 DIAGNOSIS — Z800 Family history of malignant neoplasm of digestive organs: Secondary | ICD-10-CM | POA: Diagnosis not present

## 2017-10-04 DIAGNOSIS — C221 Intrahepatic bile duct carcinoma: Secondary | ICD-10-CM | POA: Diagnosis not present

## 2017-10-04 DIAGNOSIS — D6481 Anemia due to antineoplastic chemotherapy: Secondary | ICD-10-CM | POA: Diagnosis not present

## 2017-10-04 DIAGNOSIS — Z87891 Personal history of nicotine dependence: Secondary | ICD-10-CM | POA: Diagnosis not present

## 2017-10-04 DIAGNOSIS — Z882 Allergy status to sulfonamides status: Secondary | ICD-10-CM | POA: Diagnosis not present

## 2017-10-11 DIAGNOSIS — Z79899 Other long term (current) drug therapy: Secondary | ICD-10-CM | POA: Diagnosis not present

## 2017-10-11 DIAGNOSIS — C23 Malignant neoplasm of gallbladder: Secondary | ICD-10-CM | POA: Diagnosis not present

## 2017-10-11 DIAGNOSIS — Z800 Family history of malignant neoplasm of digestive organs: Secondary | ICD-10-CM | POA: Diagnosis not present

## 2017-10-11 DIAGNOSIS — Z7901 Long term (current) use of anticoagulants: Secondary | ICD-10-CM | POA: Diagnosis not present

## 2017-10-11 DIAGNOSIS — D696 Thrombocytopenia, unspecified: Secondary | ICD-10-CM | POA: Diagnosis not present

## 2017-10-11 DIAGNOSIS — D6481 Anemia due to antineoplastic chemotherapy: Secondary | ICD-10-CM | POA: Diagnosis not present

## 2017-10-11 DIAGNOSIS — Z885 Allergy status to narcotic agent status: Secondary | ICD-10-CM | POA: Diagnosis not present

## 2017-10-11 DIAGNOSIS — C221 Intrahepatic bile duct carcinoma: Secondary | ICD-10-CM | POA: Diagnosis not present

## 2017-10-11 DIAGNOSIS — Z882 Allergy status to sulfonamides status: Secondary | ICD-10-CM | POA: Diagnosis not present

## 2017-10-11 DIAGNOSIS — Z5111 Encounter for antineoplastic chemotherapy: Secondary | ICD-10-CM | POA: Diagnosis not present

## 2017-10-11 DIAGNOSIS — Z87891 Personal history of nicotine dependence: Secondary | ICD-10-CM | POA: Diagnosis not present

## 2017-10-27 DIAGNOSIS — D696 Thrombocytopenia, unspecified: Secondary | ICD-10-CM | POA: Diagnosis not present

## 2017-10-27 DIAGNOSIS — D6481 Anemia due to antineoplastic chemotherapy: Secondary | ICD-10-CM | POA: Diagnosis not present

## 2017-10-27 DIAGNOSIS — Z885 Allergy status to narcotic agent status: Secondary | ICD-10-CM | POA: Diagnosis not present

## 2017-10-27 DIAGNOSIS — Z79899 Other long term (current) drug therapy: Secondary | ICD-10-CM | POA: Diagnosis not present

## 2017-10-27 DIAGNOSIS — C221 Intrahepatic bile duct carcinoma: Secondary | ICD-10-CM | POA: Diagnosis not present

## 2017-10-27 DIAGNOSIS — Z87891 Personal history of nicotine dependence: Secondary | ICD-10-CM | POA: Diagnosis not present

## 2017-10-27 DIAGNOSIS — Z5111 Encounter for antineoplastic chemotherapy: Secondary | ICD-10-CM | POA: Diagnosis not present

## 2017-10-27 DIAGNOSIS — Z800 Family history of malignant neoplasm of digestive organs: Secondary | ICD-10-CM | POA: Diagnosis not present

## 2017-10-27 DIAGNOSIS — Z7901 Long term (current) use of anticoagulants: Secondary | ICD-10-CM | POA: Diagnosis not present

## 2017-10-27 DIAGNOSIS — C23 Malignant neoplasm of gallbladder: Secondary | ICD-10-CM | POA: Diagnosis not present

## 2017-10-27 DIAGNOSIS — Z882 Allergy status to sulfonamides status: Secondary | ICD-10-CM | POA: Diagnosis not present

## 2017-10-29 ENCOUNTER — Telehealth: Payer: Self-pay

## 2017-10-29 NOTE — Telephone Encounter (Signed)
Printed avs and calender of upcoming appointment.per 4/26 los

## 2017-10-29 NOTE — Telephone Encounter (Signed)
Error opening  

## 2017-11-01 DIAGNOSIS — Z7901 Long term (current) use of anticoagulants: Secondary | ICD-10-CM | POA: Diagnosis not present

## 2017-11-01 DIAGNOSIS — Z882 Allergy status to sulfonamides status: Secondary | ICD-10-CM | POA: Diagnosis not present

## 2017-11-01 DIAGNOSIS — D696 Thrombocytopenia, unspecified: Secondary | ICD-10-CM | POA: Diagnosis not present

## 2017-11-01 DIAGNOSIS — C23 Malignant neoplasm of gallbladder: Secondary | ICD-10-CM | POA: Diagnosis not present

## 2017-11-01 DIAGNOSIS — Z800 Family history of malignant neoplasm of digestive organs: Secondary | ICD-10-CM | POA: Diagnosis not present

## 2017-11-01 DIAGNOSIS — D6481 Anemia due to antineoplastic chemotherapy: Secondary | ICD-10-CM | POA: Diagnosis not present

## 2017-11-01 DIAGNOSIS — Z885 Allergy status to narcotic agent status: Secondary | ICD-10-CM | POA: Diagnosis not present

## 2017-11-01 DIAGNOSIS — Z5111 Encounter for antineoplastic chemotherapy: Secondary | ICD-10-CM | POA: Diagnosis not present

## 2017-11-01 DIAGNOSIS — Z79899 Other long term (current) drug therapy: Secondary | ICD-10-CM | POA: Diagnosis not present

## 2017-11-01 DIAGNOSIS — Z87891 Personal history of nicotine dependence: Secondary | ICD-10-CM | POA: Diagnosis not present

## 2017-11-01 DIAGNOSIS — C221 Intrahepatic bile duct carcinoma: Secondary | ICD-10-CM | POA: Diagnosis not present

## 2017-11-11 DIAGNOSIS — Z885 Allergy status to narcotic agent status: Secondary | ICD-10-CM | POA: Diagnosis not present

## 2017-11-11 DIAGNOSIS — Z87891 Personal history of nicotine dependence: Secondary | ICD-10-CM | POA: Diagnosis not present

## 2017-11-11 DIAGNOSIS — Z5111 Encounter for antineoplastic chemotherapy: Secondary | ICD-10-CM | POA: Diagnosis not present

## 2017-11-11 DIAGNOSIS — Z7901 Long term (current) use of anticoagulants: Secondary | ICD-10-CM | POA: Diagnosis not present

## 2017-11-11 DIAGNOSIS — C221 Intrahepatic bile duct carcinoma: Secondary | ICD-10-CM | POA: Diagnosis not present

## 2017-11-11 DIAGNOSIS — D6481 Anemia due to antineoplastic chemotherapy: Secondary | ICD-10-CM | POA: Diagnosis not present

## 2017-11-11 DIAGNOSIS — Z882 Allergy status to sulfonamides status: Secondary | ICD-10-CM | POA: Diagnosis not present

## 2017-11-11 DIAGNOSIS — Z800 Family history of malignant neoplasm of digestive organs: Secondary | ICD-10-CM | POA: Diagnosis not present

## 2017-11-11 DIAGNOSIS — D696 Thrombocytopenia, unspecified: Secondary | ICD-10-CM | POA: Diagnosis not present

## 2017-11-11 DIAGNOSIS — C23 Malignant neoplasm of gallbladder: Secondary | ICD-10-CM | POA: Diagnosis not present

## 2017-11-11 DIAGNOSIS — Z79899 Other long term (current) drug therapy: Secondary | ICD-10-CM | POA: Diagnosis not present

## 2017-11-18 DIAGNOSIS — Z87891 Personal history of nicotine dependence: Secondary | ICD-10-CM | POA: Diagnosis not present

## 2017-11-18 DIAGNOSIS — D696 Thrombocytopenia, unspecified: Secondary | ICD-10-CM | POA: Diagnosis not present

## 2017-11-18 DIAGNOSIS — Z885 Allergy status to narcotic agent status: Secondary | ICD-10-CM | POA: Diagnosis not present

## 2017-11-18 DIAGNOSIS — C221 Intrahepatic bile duct carcinoma: Secondary | ICD-10-CM | POA: Diagnosis not present

## 2017-11-18 DIAGNOSIS — D6481 Anemia due to antineoplastic chemotherapy: Secondary | ICD-10-CM | POA: Diagnosis not present

## 2017-11-18 DIAGNOSIS — Z5111 Encounter for antineoplastic chemotherapy: Secondary | ICD-10-CM | POA: Diagnosis not present

## 2017-11-18 DIAGNOSIS — Z882 Allergy status to sulfonamides status: Secondary | ICD-10-CM | POA: Diagnosis not present

## 2017-11-18 DIAGNOSIS — Z800 Family history of malignant neoplasm of digestive organs: Secondary | ICD-10-CM | POA: Diagnosis not present

## 2017-11-18 DIAGNOSIS — Z79899 Other long term (current) drug therapy: Secondary | ICD-10-CM | POA: Diagnosis not present

## 2017-11-18 DIAGNOSIS — Z7901 Long term (current) use of anticoagulants: Secondary | ICD-10-CM | POA: Diagnosis not present

## 2017-11-18 DIAGNOSIS — C23 Malignant neoplasm of gallbladder: Secondary | ICD-10-CM | POA: Diagnosis not present

## 2017-11-25 DIAGNOSIS — D6481 Anemia due to antineoplastic chemotherapy: Secondary | ICD-10-CM | POA: Diagnosis not present

## 2017-12-02 DIAGNOSIS — D696 Thrombocytopenia, unspecified: Secondary | ICD-10-CM | POA: Diagnosis not present

## 2017-12-02 DIAGNOSIS — D6481 Anemia due to antineoplastic chemotherapy: Secondary | ICD-10-CM | POA: Diagnosis not present

## 2017-12-02 DIAGNOSIS — Z800 Family history of malignant neoplasm of digestive organs: Secondary | ICD-10-CM | POA: Diagnosis not present

## 2017-12-02 DIAGNOSIS — Z882 Allergy status to sulfonamides status: Secondary | ICD-10-CM | POA: Diagnosis not present

## 2017-12-02 DIAGNOSIS — Z79899 Other long term (current) drug therapy: Secondary | ICD-10-CM | POA: Diagnosis not present

## 2017-12-02 DIAGNOSIS — Z885 Allergy status to narcotic agent status: Secondary | ICD-10-CM | POA: Diagnosis not present

## 2017-12-02 DIAGNOSIS — C221 Intrahepatic bile duct carcinoma: Secondary | ICD-10-CM | POA: Diagnosis not present

## 2017-12-02 DIAGNOSIS — Z7901 Long term (current) use of anticoagulants: Secondary | ICD-10-CM | POA: Diagnosis not present

## 2017-12-02 DIAGNOSIS — C23 Malignant neoplasm of gallbladder: Secondary | ICD-10-CM | POA: Diagnosis not present

## 2017-12-02 DIAGNOSIS — Z87891 Personal history of nicotine dependence: Secondary | ICD-10-CM | POA: Diagnosis not present

## 2017-12-02 DIAGNOSIS — Z5111 Encounter for antineoplastic chemotherapy: Secondary | ICD-10-CM | POA: Diagnosis not present

## 2017-12-05 IMAGING — CT CT CHEST W/ CM
2 of 5 series · 12 of 36 positions shown, 15 images · IV contrast (ISOVUE 300)
Comparison: 01/27/2017 PET-CT. 12/29/2016 CT chest, abdomen and
pelvis.

CLINICAL DATA: Intrahepatic cholangiocarcinoma of the right liver
lobe with intrahepatic metastases, diagnosed 01/01/2017, presenting
for restaging with ongoing chemotherapy.

EXAM:
CT CHEST, ABDOMEN, AND PELVIS WITH CONTRAST
TECHNIQUE: Multidetector CT imaging of the chest, abdomen and pelvis was
performed following the standard protocol during bolus
administration of intravenous contrast.
CONTRAST:  100mL QFVESH-DTT IOPAMIDOL (QFVESH-DTT) INJECTION 61%

[Series 2: cap with · axial · 0.77mm/px · z∈[+1039,+1579]mm · 9 of 136 slices shown, 12 images]
[im 14/136  mediastinal]
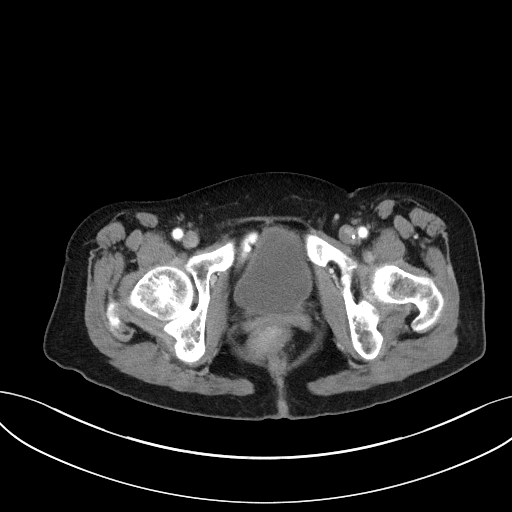
[im 14/136  lung]
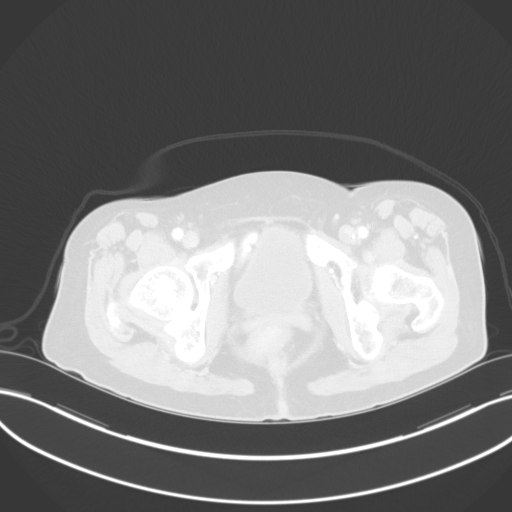
[im 28/136  lung]
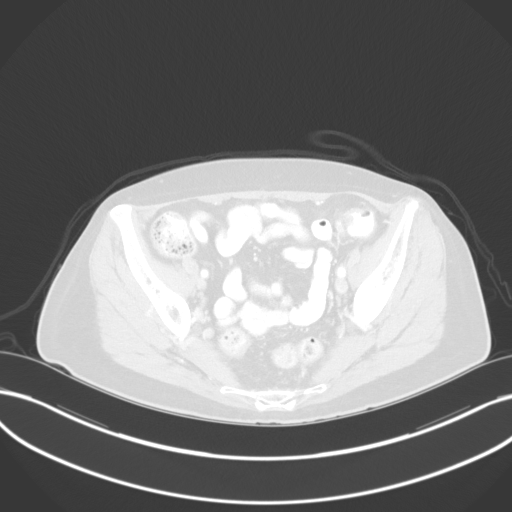
[im 41/136  lung]
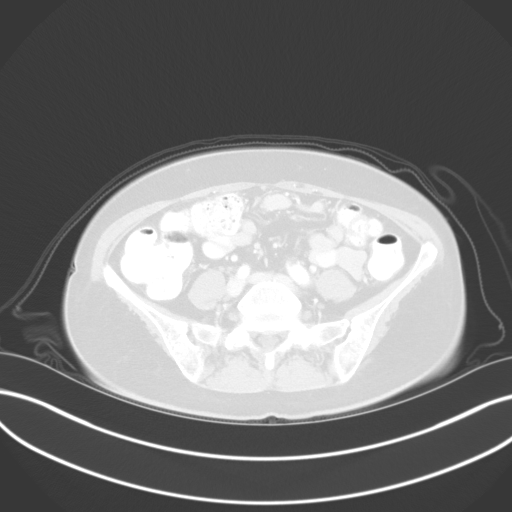
[im 55/136  lung]
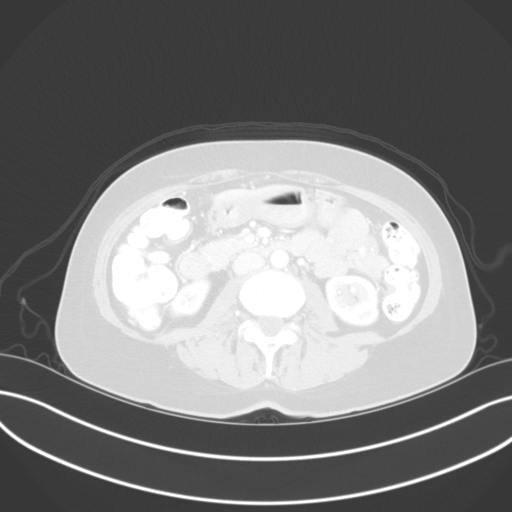
[im 68/136  mediastinal]
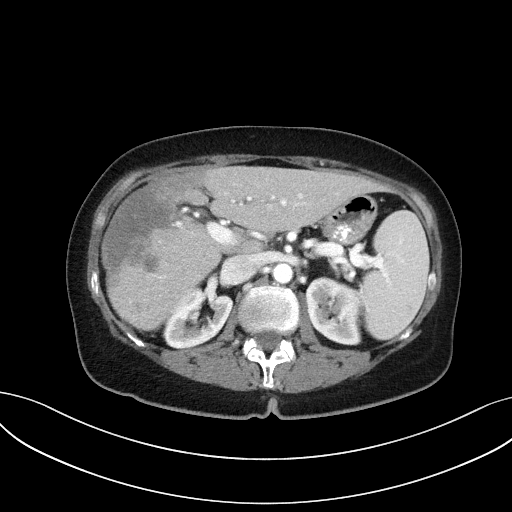
[im 68/136  lung]
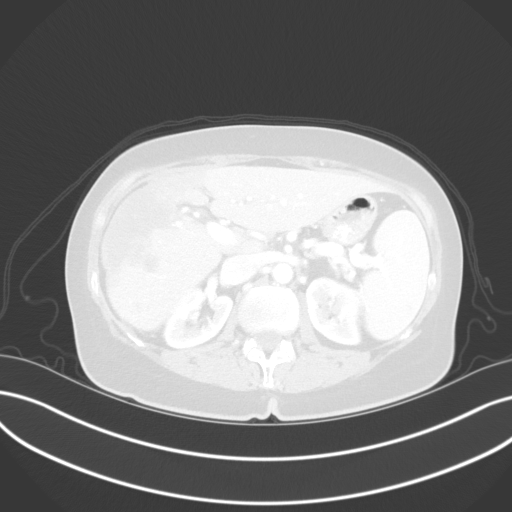
[im 82/136  lung]
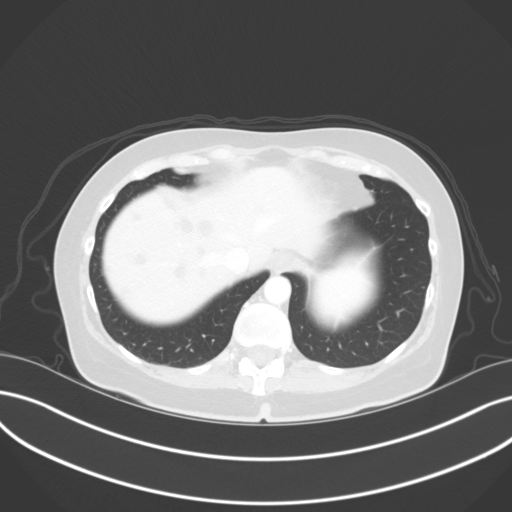
[im 95/136  lung]
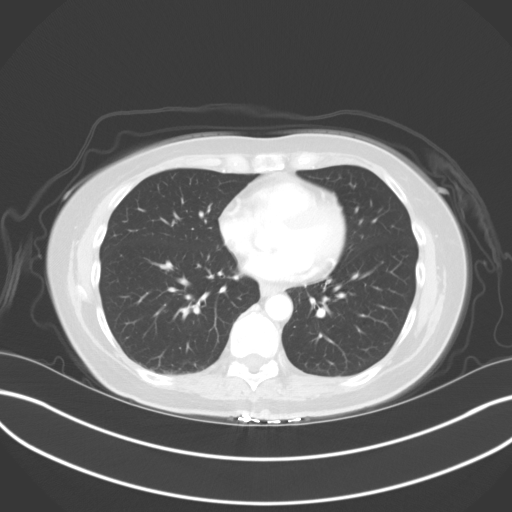
[im 109/136  lung]
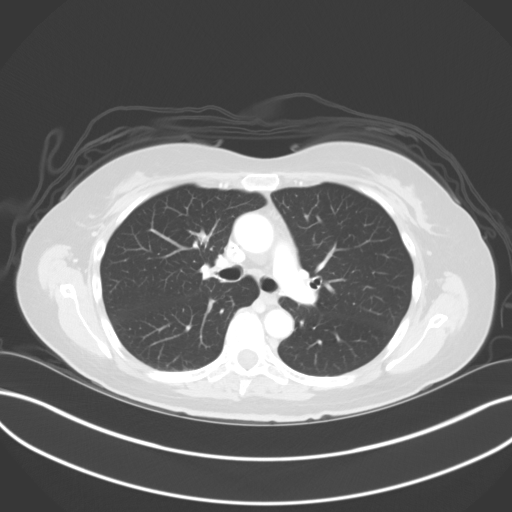
[im 122/136  mediastinal]
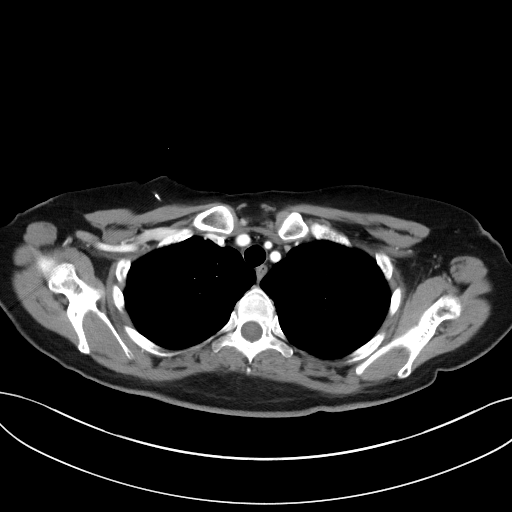
[im 122/136  lung]
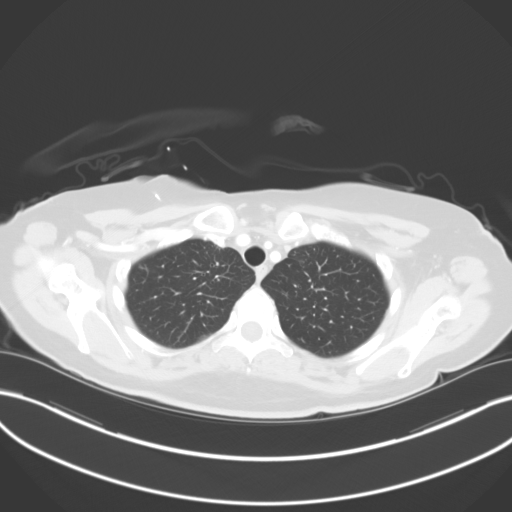

[Series 5: coronals · coronal · 0.81mm/px · 3 of 124 slices shown]
[im 25/124  lung]
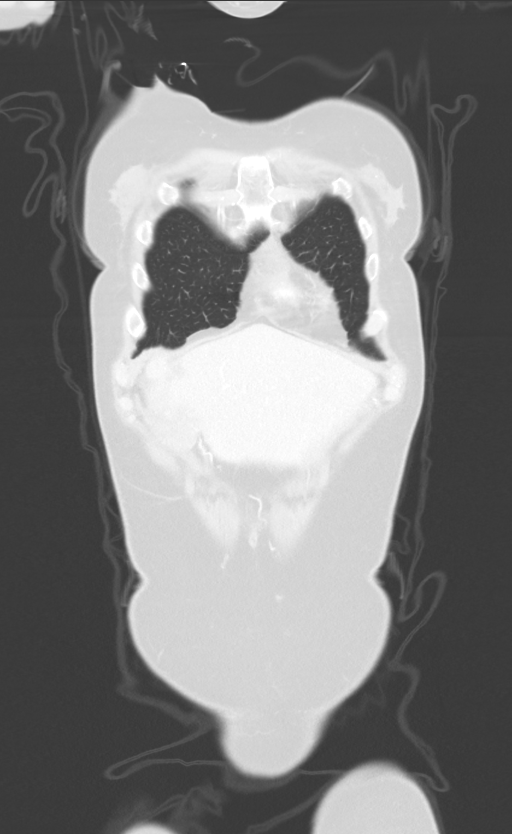
[im 50/124  lung]
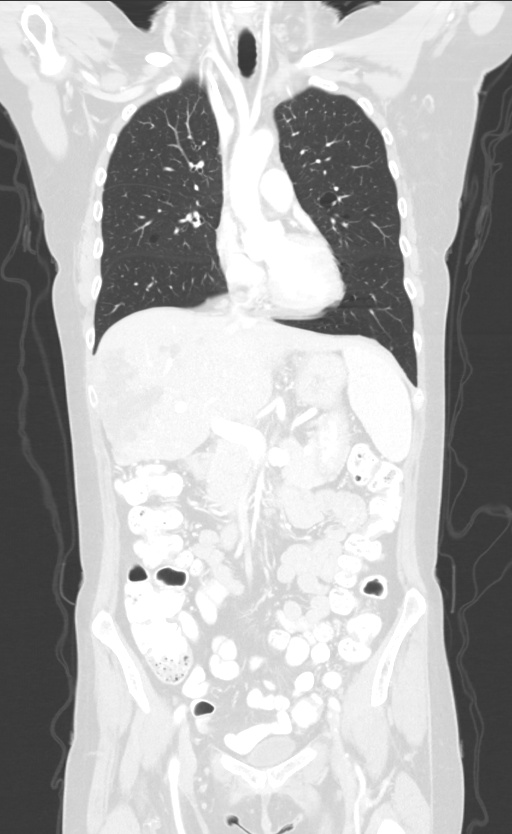
[im 74/124  lung]
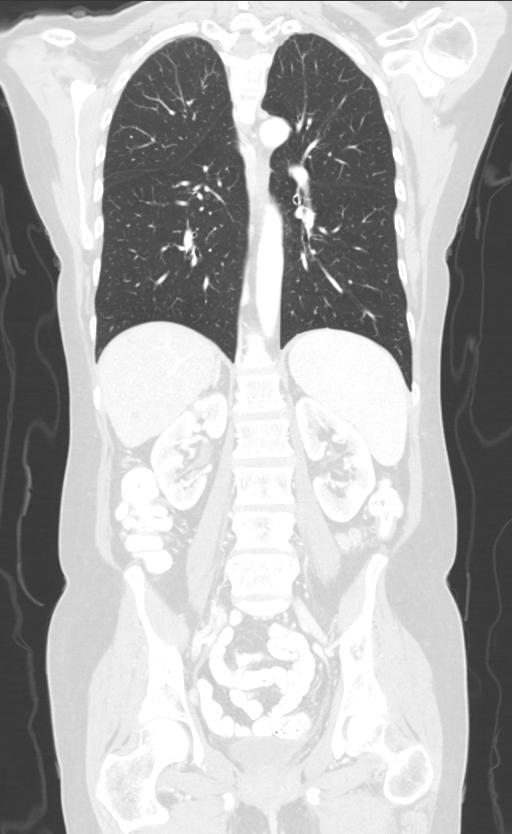

[12 of 36 positions shown; findings below may reference images not displayed]

FINDINGS: CT CHEST FINDINGS

Cardiovascular: Normal heart size. Stable trace anterior pericardial
effusion/thickening. Right internal jugular MediPort terminates at
the cavoatrial junction. Great vessels are normal in course and
caliber. No central pulmonary emboli.

Mediastinum/Nodes: Stable heterogeneous 1.6 cm right thyroid lobe
nodule (series 2/image 4). Unremarkable esophagus. No pathologically
enlarged axillary, mediastinal or hilar lymph nodes. Top-normal left
supraclavicular nodes measuring up to 0.6 cm (series 2/ image 6),
decreased from 0.9 cm on 12/29/2016.

Lungs/Pleura: No pneumothorax. No pleural effusion. Stable 3 mm
solid subpleural right middle lobe pulmonary nodule (series 4/ image
100), more likely benign. Stable calcified 2 mm anterior left lower
lobe granuloma. No acute consolidative airspace disease, lung masses
or new significant pulmonary nodules.

Musculoskeletal: No aggressive appearing focal osseous lesions. Mild
thoracic spondylosis.

CT ABDOMEN PELVIS FINDINGS

Hepatobiliary: Dominant heterogeneous infiltrative liver mass
centered in the anterior right liver lobe measures 11.9 x 5.3 cm
(series 2/image 68), previously 12.7 x 5.5 cm on 12/29/2016 CT using
similar measurement technique, mildly decreased. Numerous (greater
than 10) hypodense liver masses scattered throughout the right
greater the left liver lobes are not convincingly changed in size
and appear to demonstrate decreased capsular enhancement.
Representative lesions include a segment 6 right liver lobe 1.0 cm
mass (series 2/ image 66), previously 1.0 cm, stable. Segment 8
right liver lobe 1.6 cm mass (series 2/ image 60), 1 previously
cm, stable. Liver dome 1.4 cm mass (series 2/ image 54), previously
1.4 cm, stable. No definite new liver masses. Cholelithiasis, with
no definite gallbladder wall thickening or pericholecystic fluid. No
biliary ductal dilatation.

Pancreas: Normal, with no mass or duct dilation.

Spleen: Stable top normal size spleen.  No splenic mass.

Adrenals/Urinary Tract: Stable 1.2 cm left adrenal nodule,
characterized as a benign adenoma on 12/17/2016 MRI no new adrenal
nodules. No hydronephrosis. Stable small right renal cysts, largest
1.2 cm in the upper right kidney, as characterized on 12/17/2016 MRI
abdomen study. No new renal lesions. Normal bladder.

Stomach/Bowel: Grossly normal stomach. Normal caliber small bowel
with no small bowel wall thickening. Normal appendix. Normal large
bowel with no diverticulosis, large bowel wall thickening or
pericolonic fat stranding. Oral contrast transits to the rectum.

Vascular/Lymphatic: Normal caliber abdominal aorta. Patent portal,
splenic, hepatic and renal veins. Scattered calcifications are noted
in the left external iliac and left common femoral vein, unchanged,
compatible with chronic thrombosis. No pathologically enlarged lymph
nodes in the abdomen or pelvis.

Reproductive: Status post hysterectomy, with no abnormal findings at
the vaginal cuff. No adnexal mass.

Other: No pneumoperitoneum, ascites or focal fluid collection.

Musculoskeletal: No aggressive appearing focal osseous lesions.
Stable 1.3 cm mass inferior to the medial right twelfth rib (series
2/ image 54). Minimal lumbar spondylosis.
IMPRESSION: 1. Large infiltrative primary tumor centered in the anterior right
liver lobe is mildly decreased in size. Numerous small hepatic
metastases are stable in size and demonstrate decreased capsular
enhancement, suggesting treatment effect.
2. No new or progressive metastatic disease in the chest, abdomen or
pelvis.
3. Stable indeterminate small soft tissue mass inferior to the
medial right twelfth rib, which was non hypermetabolic on 01/27/2017
PET-CT, unlikely to be a metastasis. Schwannoma or small vascular
malformation was suggested on 01/27/2017 PET-CT report.
4. Stable 1.6 cm right thyroid lobe nodule, which was hypermetabolic
on 01/27/2017 PET-CT . Correlation with thyroid ultrasound may be
performed as clinically warranted.
5. Chronic findings include: Cholelithiasis. Stable left adrenal
adenoma. Chronic calcified thrombus in the left external iliac and
left common femoral veins.

## 2017-12-09 DIAGNOSIS — Z885 Allergy status to narcotic agent status: Secondary | ICD-10-CM | POA: Diagnosis not present

## 2017-12-09 DIAGNOSIS — Z800 Family history of malignant neoplasm of digestive organs: Secondary | ICD-10-CM | POA: Diagnosis not present

## 2017-12-09 DIAGNOSIS — Z7901 Long term (current) use of anticoagulants: Secondary | ICD-10-CM | POA: Diagnosis not present

## 2017-12-09 DIAGNOSIS — D696 Thrombocytopenia, unspecified: Secondary | ICD-10-CM | POA: Diagnosis not present

## 2017-12-09 DIAGNOSIS — D6481 Anemia due to antineoplastic chemotherapy: Secondary | ICD-10-CM | POA: Diagnosis not present

## 2017-12-09 DIAGNOSIS — Z882 Allergy status to sulfonamides status: Secondary | ICD-10-CM | POA: Diagnosis not present

## 2017-12-09 DIAGNOSIS — C23 Malignant neoplasm of gallbladder: Secondary | ICD-10-CM | POA: Diagnosis not present

## 2017-12-09 DIAGNOSIS — Z87891 Personal history of nicotine dependence: Secondary | ICD-10-CM | POA: Diagnosis not present

## 2017-12-09 DIAGNOSIS — Z5111 Encounter for antineoplastic chemotherapy: Secondary | ICD-10-CM | POA: Diagnosis not present

## 2017-12-09 DIAGNOSIS — Z79899 Other long term (current) drug therapy: Secondary | ICD-10-CM | POA: Diagnosis not present

## 2017-12-09 DIAGNOSIS — C221 Intrahepatic bile duct carcinoma: Secondary | ICD-10-CM | POA: Diagnosis not present

## 2017-12-23 DIAGNOSIS — D6481 Anemia due to antineoplastic chemotherapy: Secondary | ICD-10-CM | POA: Diagnosis not present

## 2017-12-23 DIAGNOSIS — Z882 Allergy status to sulfonamides status: Secondary | ICD-10-CM | POA: Diagnosis not present

## 2017-12-23 DIAGNOSIS — Z79899 Other long term (current) drug therapy: Secondary | ICD-10-CM | POA: Diagnosis not present

## 2017-12-23 DIAGNOSIS — D696 Thrombocytopenia, unspecified: Secondary | ICD-10-CM | POA: Diagnosis not present

## 2017-12-23 DIAGNOSIS — C23 Malignant neoplasm of gallbladder: Secondary | ICD-10-CM | POA: Diagnosis not present

## 2017-12-23 DIAGNOSIS — Z800 Family history of malignant neoplasm of digestive organs: Secondary | ICD-10-CM | POA: Diagnosis not present

## 2017-12-23 DIAGNOSIS — Z7901 Long term (current) use of anticoagulants: Secondary | ICD-10-CM | POA: Diagnosis not present

## 2017-12-23 DIAGNOSIS — Z5111 Encounter for antineoplastic chemotherapy: Secondary | ICD-10-CM | POA: Diagnosis not present

## 2017-12-23 DIAGNOSIS — C221 Intrahepatic bile duct carcinoma: Secondary | ICD-10-CM | POA: Diagnosis not present

## 2017-12-23 DIAGNOSIS — Z885 Allergy status to narcotic agent status: Secondary | ICD-10-CM | POA: Diagnosis not present

## 2017-12-23 DIAGNOSIS — Z87891 Personal history of nicotine dependence: Secondary | ICD-10-CM | POA: Diagnosis not present

## 2017-12-30 DIAGNOSIS — Z79899 Other long term (current) drug therapy: Secondary | ICD-10-CM | POA: Diagnosis not present

## 2017-12-30 DIAGNOSIS — D696 Thrombocytopenia, unspecified: Secondary | ICD-10-CM | POA: Diagnosis not present

## 2017-12-30 DIAGNOSIS — Z7901 Long term (current) use of anticoagulants: Secondary | ICD-10-CM | POA: Diagnosis not present

## 2017-12-30 DIAGNOSIS — Z800 Family history of malignant neoplasm of digestive organs: Secondary | ICD-10-CM | POA: Diagnosis not present

## 2017-12-30 DIAGNOSIS — Z5111 Encounter for antineoplastic chemotherapy: Secondary | ICD-10-CM | POA: Diagnosis not present

## 2017-12-30 DIAGNOSIS — C23 Malignant neoplasm of gallbladder: Secondary | ICD-10-CM | POA: Diagnosis not present

## 2017-12-30 DIAGNOSIS — C221 Intrahepatic bile duct carcinoma: Secondary | ICD-10-CM | POA: Diagnosis not present

## 2017-12-30 DIAGNOSIS — D6481 Anemia due to antineoplastic chemotherapy: Secondary | ICD-10-CM | POA: Diagnosis not present

## 2017-12-30 DIAGNOSIS — Z882 Allergy status to sulfonamides status: Secondary | ICD-10-CM | POA: Diagnosis not present

## 2017-12-30 DIAGNOSIS — Z87891 Personal history of nicotine dependence: Secondary | ICD-10-CM | POA: Diagnosis not present

## 2017-12-30 DIAGNOSIS — Z885 Allergy status to narcotic agent status: Secondary | ICD-10-CM | POA: Diagnosis not present

## 2018-01-13 DIAGNOSIS — D6481 Anemia due to antineoplastic chemotherapy: Secondary | ICD-10-CM | POA: Diagnosis not present

## 2018-01-13 DIAGNOSIS — Z87891 Personal history of nicotine dependence: Secondary | ICD-10-CM | POA: Diagnosis not present

## 2018-01-13 DIAGNOSIS — Z79899 Other long term (current) drug therapy: Secondary | ICD-10-CM | POA: Diagnosis not present

## 2018-01-13 DIAGNOSIS — Z5111 Encounter for antineoplastic chemotherapy: Secondary | ICD-10-CM | POA: Diagnosis not present

## 2018-01-13 DIAGNOSIS — Z800 Family history of malignant neoplasm of digestive organs: Secondary | ICD-10-CM | POA: Diagnosis not present

## 2018-01-13 DIAGNOSIS — Z885 Allergy status to narcotic agent status: Secondary | ICD-10-CM | POA: Diagnosis not present

## 2018-01-13 DIAGNOSIS — Z882 Allergy status to sulfonamides status: Secondary | ICD-10-CM | POA: Diagnosis not present

## 2018-01-13 DIAGNOSIS — D696 Thrombocytopenia, unspecified: Secondary | ICD-10-CM | POA: Diagnosis not present

## 2018-01-13 DIAGNOSIS — C23 Malignant neoplasm of gallbladder: Secondary | ICD-10-CM | POA: Diagnosis not present

## 2018-01-13 DIAGNOSIS — Z7901 Long term (current) use of anticoagulants: Secondary | ICD-10-CM | POA: Diagnosis not present

## 2018-01-13 DIAGNOSIS — C221 Intrahepatic bile duct carcinoma: Secondary | ICD-10-CM | POA: Diagnosis not present

## 2018-01-20 DIAGNOSIS — C23 Malignant neoplasm of gallbladder: Secondary | ICD-10-CM | POA: Diagnosis not present

## 2018-01-20 DIAGNOSIS — D6481 Anemia due to antineoplastic chemotherapy: Secondary | ICD-10-CM | POA: Diagnosis not present

## 2018-01-20 DIAGNOSIS — Z882 Allergy status to sulfonamides status: Secondary | ICD-10-CM | POA: Diagnosis not present

## 2018-01-20 DIAGNOSIS — Z7901 Long term (current) use of anticoagulants: Secondary | ICD-10-CM | POA: Diagnosis not present

## 2018-01-20 DIAGNOSIS — Z885 Allergy status to narcotic agent status: Secondary | ICD-10-CM | POA: Diagnosis not present

## 2018-01-20 DIAGNOSIS — D696 Thrombocytopenia, unspecified: Secondary | ICD-10-CM | POA: Diagnosis not present

## 2018-01-20 DIAGNOSIS — C221 Intrahepatic bile duct carcinoma: Secondary | ICD-10-CM | POA: Diagnosis not present

## 2018-01-20 DIAGNOSIS — Z800 Family history of malignant neoplasm of digestive organs: Secondary | ICD-10-CM | POA: Diagnosis not present

## 2018-01-20 DIAGNOSIS — Z79899 Other long term (current) drug therapy: Secondary | ICD-10-CM | POA: Diagnosis not present

## 2018-01-20 DIAGNOSIS — Z5111 Encounter for antineoplastic chemotherapy: Secondary | ICD-10-CM | POA: Diagnosis not present

## 2018-01-20 DIAGNOSIS — Z87891 Personal history of nicotine dependence: Secondary | ICD-10-CM | POA: Diagnosis not present

## 2018-02-03 DIAGNOSIS — C23 Malignant neoplasm of gallbladder: Secondary | ICD-10-CM | POA: Diagnosis not present

## 2018-02-03 DIAGNOSIS — D6481 Anemia due to antineoplastic chemotherapy: Secondary | ICD-10-CM | POA: Diagnosis not present

## 2018-02-03 DIAGNOSIS — Z87891 Personal history of nicotine dependence: Secondary | ICD-10-CM | POA: Diagnosis not present

## 2018-02-03 DIAGNOSIS — D696 Thrombocytopenia, unspecified: Secondary | ICD-10-CM | POA: Diagnosis not present

## 2018-02-03 DIAGNOSIS — Z882 Allergy status to sulfonamides status: Secondary | ICD-10-CM | POA: Diagnosis not present

## 2018-02-03 DIAGNOSIS — Z800 Family history of malignant neoplasm of digestive organs: Secondary | ICD-10-CM | POA: Diagnosis not present

## 2018-02-03 DIAGNOSIS — Z7901 Long term (current) use of anticoagulants: Secondary | ICD-10-CM | POA: Diagnosis not present

## 2018-02-03 DIAGNOSIS — Z79899 Other long term (current) drug therapy: Secondary | ICD-10-CM | POA: Diagnosis not present

## 2018-02-03 DIAGNOSIS — Z5111 Encounter for antineoplastic chemotherapy: Secondary | ICD-10-CM | POA: Diagnosis not present

## 2018-02-03 DIAGNOSIS — Z885 Allergy status to narcotic agent status: Secondary | ICD-10-CM | POA: Diagnosis not present

## 2018-02-03 DIAGNOSIS — C221 Intrahepatic bile duct carcinoma: Secondary | ICD-10-CM | POA: Diagnosis not present

## 2018-02-10 DIAGNOSIS — Z87891 Personal history of nicotine dependence: Secondary | ICD-10-CM | POA: Diagnosis not present

## 2018-02-10 DIAGNOSIS — Z885 Allergy status to narcotic agent status: Secondary | ICD-10-CM | POA: Diagnosis not present

## 2018-02-10 DIAGNOSIS — Z79899 Other long term (current) drug therapy: Secondary | ICD-10-CM | POA: Diagnosis not present

## 2018-02-10 DIAGNOSIS — Z882 Allergy status to sulfonamides status: Secondary | ICD-10-CM | POA: Diagnosis not present

## 2018-02-10 DIAGNOSIS — Z5111 Encounter for antineoplastic chemotherapy: Secondary | ICD-10-CM | POA: Diagnosis not present

## 2018-02-10 DIAGNOSIS — C221 Intrahepatic bile duct carcinoma: Secondary | ICD-10-CM | POA: Diagnosis not present

## 2018-02-10 DIAGNOSIS — Z800 Family history of malignant neoplasm of digestive organs: Secondary | ICD-10-CM | POA: Diagnosis not present

## 2018-02-10 DIAGNOSIS — D6481 Anemia due to antineoplastic chemotherapy: Secondary | ICD-10-CM | POA: Diagnosis not present

## 2018-02-10 DIAGNOSIS — Z7901 Long term (current) use of anticoagulants: Secondary | ICD-10-CM | POA: Diagnosis not present

## 2018-02-10 DIAGNOSIS — D696 Thrombocytopenia, unspecified: Secondary | ICD-10-CM | POA: Diagnosis not present

## 2018-02-10 DIAGNOSIS — C23 Malignant neoplasm of gallbladder: Secondary | ICD-10-CM | POA: Diagnosis not present

## 2018-02-24 DIAGNOSIS — Z882 Allergy status to sulfonamides status: Secondary | ICD-10-CM | POA: Diagnosis not present

## 2018-02-24 DIAGNOSIS — C221 Intrahepatic bile duct carcinoma: Secondary | ICD-10-CM | POA: Diagnosis not present

## 2018-02-24 DIAGNOSIS — C23 Malignant neoplasm of gallbladder: Secondary | ICD-10-CM | POA: Diagnosis not present

## 2018-02-24 DIAGNOSIS — Z885 Allergy status to narcotic agent status: Secondary | ICD-10-CM | POA: Diagnosis not present

## 2018-02-24 DIAGNOSIS — Z87891 Personal history of nicotine dependence: Secondary | ICD-10-CM | POA: Diagnosis not present

## 2018-02-24 DIAGNOSIS — D6481 Anemia due to antineoplastic chemotherapy: Secondary | ICD-10-CM | POA: Diagnosis not present

## 2018-02-24 DIAGNOSIS — Z7901 Long term (current) use of anticoagulants: Secondary | ICD-10-CM | POA: Diagnosis not present

## 2018-02-24 DIAGNOSIS — Z800 Family history of malignant neoplasm of digestive organs: Secondary | ICD-10-CM | POA: Diagnosis not present

## 2018-02-24 DIAGNOSIS — Z86718 Personal history of other venous thrombosis and embolism: Secondary | ICD-10-CM | POA: Diagnosis not present

## 2018-02-24 DIAGNOSIS — Z79899 Other long term (current) drug therapy: Secondary | ICD-10-CM | POA: Diagnosis not present

## 2018-02-24 DIAGNOSIS — Z791 Long term (current) use of non-steroidal anti-inflammatories (NSAID): Secondary | ICD-10-CM | POA: Diagnosis not present

## 2018-02-24 DIAGNOSIS — Z5111 Encounter for antineoplastic chemotherapy: Secondary | ICD-10-CM | POA: Diagnosis not present

## 2018-03-03 DIAGNOSIS — C23 Malignant neoplasm of gallbladder: Secondary | ICD-10-CM | POA: Diagnosis not present

## 2018-03-03 DIAGNOSIS — Z5111 Encounter for antineoplastic chemotherapy: Secondary | ICD-10-CM | POA: Diagnosis not present

## 2018-03-03 DIAGNOSIS — D6481 Anemia due to antineoplastic chemotherapy: Secondary | ICD-10-CM | POA: Diagnosis not present

## 2018-03-03 DIAGNOSIS — Z791 Long term (current) use of non-steroidal anti-inflammatories (NSAID): Secondary | ICD-10-CM | POA: Diagnosis not present

## 2018-03-03 DIAGNOSIS — Z800 Family history of malignant neoplasm of digestive organs: Secondary | ICD-10-CM | POA: Diagnosis not present

## 2018-03-03 DIAGNOSIS — Z882 Allergy status to sulfonamides status: Secondary | ICD-10-CM | POA: Diagnosis not present

## 2018-03-03 DIAGNOSIS — Z885 Allergy status to narcotic agent status: Secondary | ICD-10-CM | POA: Diagnosis not present

## 2018-03-03 DIAGNOSIS — Z87891 Personal history of nicotine dependence: Secondary | ICD-10-CM | POA: Diagnosis not present

## 2018-03-03 DIAGNOSIS — Z79899 Other long term (current) drug therapy: Secondary | ICD-10-CM | POA: Diagnosis not present

## 2018-03-03 DIAGNOSIS — Z86718 Personal history of other venous thrombosis and embolism: Secondary | ICD-10-CM | POA: Diagnosis not present

## 2018-03-03 DIAGNOSIS — C221 Intrahepatic bile duct carcinoma: Secondary | ICD-10-CM | POA: Diagnosis not present

## 2018-03-03 DIAGNOSIS — Z7901 Long term (current) use of anticoagulants: Secondary | ICD-10-CM | POA: Diagnosis not present

## 2018-03-24 DIAGNOSIS — Z882 Allergy status to sulfonamides status: Secondary | ICD-10-CM | POA: Diagnosis not present

## 2018-03-24 DIAGNOSIS — D6481 Anemia due to antineoplastic chemotherapy: Secondary | ICD-10-CM | POA: Diagnosis not present

## 2018-03-24 DIAGNOSIS — Z885 Allergy status to narcotic agent status: Secondary | ICD-10-CM | POA: Diagnosis not present

## 2018-03-24 DIAGNOSIS — Z5111 Encounter for antineoplastic chemotherapy: Secondary | ICD-10-CM | POA: Diagnosis not present

## 2018-03-24 DIAGNOSIS — Z87891 Personal history of nicotine dependence: Secondary | ICD-10-CM | POA: Diagnosis not present

## 2018-03-24 DIAGNOSIS — C221 Intrahepatic bile duct carcinoma: Secondary | ICD-10-CM | POA: Diagnosis not present

## 2018-03-24 DIAGNOSIS — Z791 Long term (current) use of non-steroidal anti-inflammatories (NSAID): Secondary | ICD-10-CM | POA: Diagnosis not present

## 2018-03-24 DIAGNOSIS — Z800 Family history of malignant neoplasm of digestive organs: Secondary | ICD-10-CM | POA: Diagnosis not present

## 2018-03-24 DIAGNOSIS — Z79899 Other long term (current) drug therapy: Secondary | ICD-10-CM | POA: Diagnosis not present

## 2018-03-24 DIAGNOSIS — Z86718 Personal history of other venous thrombosis and embolism: Secondary | ICD-10-CM | POA: Diagnosis not present

## 2018-03-24 DIAGNOSIS — C23 Malignant neoplasm of gallbladder: Secondary | ICD-10-CM | POA: Diagnosis not present

## 2018-03-24 DIAGNOSIS — Z7901 Long term (current) use of anticoagulants: Secondary | ICD-10-CM | POA: Diagnosis not present

## 2018-04-07 DIAGNOSIS — K7689 Other specified diseases of liver: Secondary | ICD-10-CM | POA: Diagnosis not present

## 2018-04-07 DIAGNOSIS — C23 Malignant neoplasm of gallbladder: Secondary | ICD-10-CM | POA: Diagnosis not present

## 2018-04-07 DIAGNOSIS — C221 Intrahepatic bile duct carcinoma: Secondary | ICD-10-CM | POA: Diagnosis not present

## 2018-04-14 DIAGNOSIS — Z791 Long term (current) use of non-steroidal anti-inflammatories (NSAID): Secondary | ICD-10-CM | POA: Diagnosis not present

## 2018-04-14 DIAGNOSIS — Z7901 Long term (current) use of anticoagulants: Secondary | ICD-10-CM | POA: Diagnosis not present

## 2018-04-14 DIAGNOSIS — D6481 Anemia due to antineoplastic chemotherapy: Secondary | ICD-10-CM | POA: Diagnosis not present

## 2018-04-14 DIAGNOSIS — Z79899 Other long term (current) drug therapy: Secondary | ICD-10-CM | POA: Diagnosis not present

## 2018-04-14 DIAGNOSIS — C221 Intrahepatic bile duct carcinoma: Secondary | ICD-10-CM | POA: Diagnosis not present

## 2018-04-14 DIAGNOSIS — Z5111 Encounter for antineoplastic chemotherapy: Secondary | ICD-10-CM | POA: Diagnosis not present

## 2018-04-14 DIAGNOSIS — C23 Malignant neoplasm of gallbladder: Secondary | ICD-10-CM | POA: Diagnosis not present

## 2018-05-05 DIAGNOSIS — D6481 Anemia due to antineoplastic chemotherapy: Secondary | ICD-10-CM | POA: Diagnosis not present

## 2018-05-05 DIAGNOSIS — C221 Intrahepatic bile duct carcinoma: Secondary | ICD-10-CM | POA: Diagnosis not present

## 2018-05-05 DIAGNOSIS — Z791 Long term (current) use of non-steroidal anti-inflammatories (NSAID): Secondary | ICD-10-CM | POA: Diagnosis not present

## 2018-05-05 DIAGNOSIS — Z5111 Encounter for antineoplastic chemotherapy: Secondary | ICD-10-CM | POA: Diagnosis not present

## 2018-05-05 DIAGNOSIS — C23 Malignant neoplasm of gallbladder: Secondary | ICD-10-CM | POA: Diagnosis not present

## 2018-05-05 DIAGNOSIS — Z7901 Long term (current) use of anticoagulants: Secondary | ICD-10-CM | POA: Diagnosis not present

## 2018-05-05 DIAGNOSIS — Z79899 Other long term (current) drug therapy: Secondary | ICD-10-CM | POA: Diagnosis not present

## 2018-05-26 DIAGNOSIS — Z5111 Encounter for antineoplastic chemotherapy: Secondary | ICD-10-CM | POA: Diagnosis not present

## 2018-05-26 DIAGNOSIS — Z86718 Personal history of other venous thrombosis and embolism: Secondary | ICD-10-CM | POA: Diagnosis not present

## 2018-05-26 DIAGNOSIS — Z791 Long term (current) use of non-steroidal anti-inflammatories (NSAID): Secondary | ICD-10-CM | POA: Diagnosis not present

## 2018-05-26 DIAGNOSIS — Z7901 Long term (current) use of anticoagulants: Secondary | ICD-10-CM | POA: Diagnosis not present

## 2018-05-26 DIAGNOSIS — Z800 Family history of malignant neoplasm of digestive organs: Secondary | ICD-10-CM | POA: Diagnosis not present

## 2018-05-26 DIAGNOSIS — Z882 Allergy status to sulfonamides status: Secondary | ICD-10-CM | POA: Diagnosis not present

## 2018-05-26 DIAGNOSIS — C23 Malignant neoplasm of gallbladder: Secondary | ICD-10-CM | POA: Diagnosis not present

## 2018-05-26 DIAGNOSIS — Z79899 Other long term (current) drug therapy: Secondary | ICD-10-CM | POA: Diagnosis not present

## 2018-05-26 DIAGNOSIS — Z885 Allergy status to narcotic agent status: Secondary | ICD-10-CM | POA: Diagnosis not present

## 2018-05-26 DIAGNOSIS — C221 Intrahepatic bile duct carcinoma: Secondary | ICD-10-CM | POA: Diagnosis not present

## 2018-05-26 DIAGNOSIS — D6481 Anemia due to antineoplastic chemotherapy: Secondary | ICD-10-CM | POA: Diagnosis not present

## 2018-05-26 DIAGNOSIS — Z87891 Personal history of nicotine dependence: Secondary | ICD-10-CM | POA: Diagnosis not present

## 2018-06-16 DIAGNOSIS — Z885 Allergy status to narcotic agent status: Secondary | ICD-10-CM | POA: Diagnosis not present

## 2018-06-16 DIAGNOSIS — C23 Malignant neoplasm of gallbladder: Secondary | ICD-10-CM | POA: Diagnosis not present

## 2018-06-16 DIAGNOSIS — Z791 Long term (current) use of non-steroidal anti-inflammatories (NSAID): Secondary | ICD-10-CM | POA: Diagnosis not present

## 2018-06-16 DIAGNOSIS — D6481 Anemia due to antineoplastic chemotherapy: Secondary | ICD-10-CM | POA: Diagnosis not present

## 2018-06-16 DIAGNOSIS — Z5111 Encounter for antineoplastic chemotherapy: Secondary | ICD-10-CM | POA: Diagnosis not present

## 2018-06-16 DIAGNOSIS — Z79899 Other long term (current) drug therapy: Secondary | ICD-10-CM | POA: Diagnosis not present

## 2018-06-16 DIAGNOSIS — Z800 Family history of malignant neoplasm of digestive organs: Secondary | ICD-10-CM | POA: Diagnosis not present

## 2018-06-16 DIAGNOSIS — Z882 Allergy status to sulfonamides status: Secondary | ICD-10-CM | POA: Diagnosis not present

## 2018-06-16 DIAGNOSIS — Z86718 Personal history of other venous thrombosis and embolism: Secondary | ICD-10-CM | POA: Diagnosis not present

## 2018-06-16 DIAGNOSIS — C221 Intrahepatic bile duct carcinoma: Secondary | ICD-10-CM | POA: Diagnosis not present

## 2018-06-16 DIAGNOSIS — Z87891 Personal history of nicotine dependence: Secondary | ICD-10-CM | POA: Diagnosis not present

## 2018-06-16 DIAGNOSIS — Z7901 Long term (current) use of anticoagulants: Secondary | ICD-10-CM | POA: Diagnosis not present

## 2018-07-07 DIAGNOSIS — Z87891 Personal history of nicotine dependence: Secondary | ICD-10-CM | POA: Diagnosis not present

## 2018-07-07 DIAGNOSIS — G629 Polyneuropathy, unspecified: Secondary | ICD-10-CM | POA: Diagnosis not present

## 2018-07-07 DIAGNOSIS — Z5111 Encounter for antineoplastic chemotherapy: Secondary | ICD-10-CM | POA: Diagnosis not present

## 2018-07-07 DIAGNOSIS — Z885 Allergy status to narcotic agent status: Secondary | ICD-10-CM | POA: Diagnosis not present

## 2018-07-07 DIAGNOSIS — F329 Major depressive disorder, single episode, unspecified: Secondary | ICD-10-CM | POA: Diagnosis not present

## 2018-07-07 DIAGNOSIS — Z800 Family history of malignant neoplasm of digestive organs: Secondary | ICD-10-CM | POA: Diagnosis not present

## 2018-07-07 DIAGNOSIS — C23 Malignant neoplasm of gallbladder: Secondary | ICD-10-CM | POA: Diagnosis not present

## 2018-07-07 DIAGNOSIS — Z882 Allergy status to sulfonamides status: Secondary | ICD-10-CM | POA: Diagnosis not present

## 2018-07-07 DIAGNOSIS — Z7901 Long term (current) use of anticoagulants: Secondary | ICD-10-CM | POA: Diagnosis not present

## 2018-07-07 DIAGNOSIS — D6481 Anemia due to antineoplastic chemotherapy: Secondary | ICD-10-CM | POA: Diagnosis not present

## 2018-07-07 DIAGNOSIS — C787 Secondary malignant neoplasm of liver and intrahepatic bile duct: Secondary | ICD-10-CM | POA: Diagnosis not present

## 2018-07-07 DIAGNOSIS — C221 Intrahepatic bile duct carcinoma: Secondary | ICD-10-CM | POA: Diagnosis not present

## 2018-07-07 DIAGNOSIS — Z79899 Other long term (current) drug therapy: Secondary | ICD-10-CM | POA: Diagnosis not present

## 2018-07-07 DIAGNOSIS — Z90710 Acquired absence of both cervix and uterus: Secondary | ICD-10-CM | POA: Diagnosis not present

## 2018-07-07 DIAGNOSIS — Z86718 Personal history of other venous thrombosis and embolism: Secondary | ICD-10-CM | POA: Diagnosis not present

## 2018-07-20 DIAGNOSIS — K8020 Calculus of gallbladder without cholecystitis without obstruction: Secondary | ICD-10-CM | POA: Diagnosis not present

## 2018-07-20 DIAGNOSIS — R911 Solitary pulmonary nodule: Secondary | ICD-10-CM | POA: Diagnosis not present

## 2018-07-20 DIAGNOSIS — K7689 Other specified diseases of liver: Secondary | ICD-10-CM | POA: Diagnosis not present

## 2018-07-20 DIAGNOSIS — Z8509 Personal history of malignant neoplasm of other digestive organs: Secondary | ICD-10-CM | POA: Diagnosis not present

## 2018-07-28 DIAGNOSIS — F329 Major depressive disorder, single episode, unspecified: Secondary | ICD-10-CM | POA: Diagnosis not present

## 2018-07-28 DIAGNOSIS — C23 Malignant neoplasm of gallbladder: Secondary | ICD-10-CM | POA: Diagnosis not present

## 2018-07-28 DIAGNOSIS — Z7901 Long term (current) use of anticoagulants: Secondary | ICD-10-CM | POA: Diagnosis not present

## 2018-07-28 DIAGNOSIS — Z87891 Personal history of nicotine dependence: Secondary | ICD-10-CM | POA: Diagnosis not present

## 2018-07-28 DIAGNOSIS — C221 Intrahepatic bile duct carcinoma: Secondary | ICD-10-CM | POA: Diagnosis not present

## 2018-07-28 DIAGNOSIS — Z79899 Other long term (current) drug therapy: Secondary | ICD-10-CM | POA: Diagnosis not present

## 2018-07-28 DIAGNOSIS — Z5111 Encounter for antineoplastic chemotherapy: Secondary | ICD-10-CM | POA: Diagnosis not present

## 2018-07-28 DIAGNOSIS — Z90710 Acquired absence of both cervix and uterus: Secondary | ICD-10-CM | POA: Diagnosis not present

## 2018-07-28 DIAGNOSIS — D6481 Anemia due to antineoplastic chemotherapy: Secondary | ICD-10-CM | POA: Diagnosis not present

## 2018-07-28 DIAGNOSIS — Z800 Family history of malignant neoplasm of digestive organs: Secondary | ICD-10-CM | POA: Diagnosis not present

## 2018-07-28 DIAGNOSIS — Z86718 Personal history of other venous thrombosis and embolism: Secondary | ICD-10-CM | POA: Diagnosis not present

## 2018-07-28 DIAGNOSIS — Z885 Allergy status to narcotic agent status: Secondary | ICD-10-CM | POA: Diagnosis not present

## 2018-07-28 DIAGNOSIS — Z882 Allergy status to sulfonamides status: Secondary | ICD-10-CM | POA: Diagnosis not present

## 2018-07-28 DIAGNOSIS — C787 Secondary malignant neoplasm of liver and intrahepatic bile duct: Secondary | ICD-10-CM | POA: Diagnosis not present

## 2018-07-28 DIAGNOSIS — G629 Polyneuropathy, unspecified: Secondary | ICD-10-CM | POA: Diagnosis not present

## 2018-08-18 DIAGNOSIS — D6481 Anemia due to antineoplastic chemotherapy: Secondary | ICD-10-CM | POA: Diagnosis not present

## 2018-08-18 DIAGNOSIS — C23 Malignant neoplasm of gallbladder: Secondary | ICD-10-CM | POA: Diagnosis not present

## 2018-08-18 DIAGNOSIS — Z87891 Personal history of nicotine dependence: Secondary | ICD-10-CM | POA: Diagnosis not present

## 2018-08-18 DIAGNOSIS — Z882 Allergy status to sulfonamides status: Secondary | ICD-10-CM | POA: Diagnosis not present

## 2018-08-18 DIAGNOSIS — Z885 Allergy status to narcotic agent status: Secondary | ICD-10-CM | POA: Diagnosis not present

## 2018-08-18 DIAGNOSIS — Z800 Family history of malignant neoplasm of digestive organs: Secondary | ICD-10-CM | POA: Diagnosis not present

## 2018-08-18 DIAGNOSIS — G629 Polyneuropathy, unspecified: Secondary | ICD-10-CM | POA: Diagnosis not present

## 2018-08-18 DIAGNOSIS — Z79899 Other long term (current) drug therapy: Secondary | ICD-10-CM | POA: Diagnosis not present

## 2018-08-18 DIAGNOSIS — C787 Secondary malignant neoplasm of liver and intrahepatic bile duct: Secondary | ICD-10-CM | POA: Diagnosis not present

## 2018-08-18 DIAGNOSIS — Z86718 Personal history of other venous thrombosis and embolism: Secondary | ICD-10-CM | POA: Diagnosis not present

## 2018-08-18 DIAGNOSIS — F329 Major depressive disorder, single episode, unspecified: Secondary | ICD-10-CM | POA: Diagnosis not present

## 2018-08-18 DIAGNOSIS — Z7901 Long term (current) use of anticoagulants: Secondary | ICD-10-CM | POA: Diagnosis not present

## 2018-08-18 DIAGNOSIS — C221 Intrahepatic bile duct carcinoma: Secondary | ICD-10-CM | POA: Diagnosis not present

## 2018-08-18 DIAGNOSIS — Z90710 Acquired absence of both cervix and uterus: Secondary | ICD-10-CM | POA: Diagnosis not present

## 2018-08-18 DIAGNOSIS — Z5111 Encounter for antineoplastic chemotherapy: Secondary | ICD-10-CM | POA: Diagnosis not present

## 2018-09-08 DIAGNOSIS — D6481 Anemia due to antineoplastic chemotherapy: Secondary | ICD-10-CM | POA: Diagnosis not present

## 2018-09-08 DIAGNOSIS — Z90710 Acquired absence of both cervix and uterus: Secondary | ICD-10-CM | POA: Diagnosis not present

## 2018-09-08 DIAGNOSIS — Z800 Family history of malignant neoplasm of digestive organs: Secondary | ICD-10-CM | POA: Diagnosis not present

## 2018-09-08 DIAGNOSIS — Z86718 Personal history of other venous thrombosis and embolism: Secondary | ICD-10-CM | POA: Diagnosis not present

## 2018-09-08 DIAGNOSIS — C23 Malignant neoplasm of gallbladder: Secondary | ICD-10-CM | POA: Diagnosis not present

## 2018-09-08 DIAGNOSIS — Z885 Allergy status to narcotic agent status: Secondary | ICD-10-CM | POA: Diagnosis not present

## 2018-09-08 DIAGNOSIS — Z882 Allergy status to sulfonamides status: Secondary | ICD-10-CM | POA: Diagnosis not present

## 2018-09-08 DIAGNOSIS — Z5111 Encounter for antineoplastic chemotherapy: Secondary | ICD-10-CM | POA: Diagnosis not present

## 2018-09-08 DIAGNOSIS — Z87891 Personal history of nicotine dependence: Secondary | ICD-10-CM | POA: Diagnosis not present

## 2018-09-08 DIAGNOSIS — G629 Polyneuropathy, unspecified: Secondary | ICD-10-CM | POA: Diagnosis not present

## 2018-09-08 DIAGNOSIS — C787 Secondary malignant neoplasm of liver and intrahepatic bile duct: Secondary | ICD-10-CM | POA: Diagnosis not present

## 2018-09-08 DIAGNOSIS — C221 Intrahepatic bile duct carcinoma: Secondary | ICD-10-CM | POA: Diagnosis not present

## 2018-09-08 DIAGNOSIS — Z79899 Other long term (current) drug therapy: Secondary | ICD-10-CM | POA: Diagnosis not present

## 2018-09-08 DIAGNOSIS — Z7901 Long term (current) use of anticoagulants: Secondary | ICD-10-CM | POA: Diagnosis not present

## 2018-09-08 DIAGNOSIS — F329 Major depressive disorder, single episode, unspecified: Secondary | ICD-10-CM | POA: Diagnosis not present

## 2018-09-29 DIAGNOSIS — Z882 Allergy status to sulfonamides status: Secondary | ICD-10-CM | POA: Diagnosis not present

## 2018-09-29 DIAGNOSIS — Z885 Allergy status to narcotic agent status: Secondary | ICD-10-CM | POA: Diagnosis not present

## 2018-09-29 DIAGNOSIS — C787 Secondary malignant neoplasm of liver and intrahepatic bile duct: Secondary | ICD-10-CM | POA: Diagnosis not present

## 2018-09-29 DIAGNOSIS — Z79899 Other long term (current) drug therapy: Secondary | ICD-10-CM | POA: Diagnosis not present

## 2018-09-29 DIAGNOSIS — Z5111 Encounter for antineoplastic chemotherapy: Secondary | ICD-10-CM | POA: Diagnosis not present

## 2018-09-29 DIAGNOSIS — Z86718 Personal history of other venous thrombosis and embolism: Secondary | ICD-10-CM | POA: Diagnosis not present

## 2018-09-29 DIAGNOSIS — Z87891 Personal history of nicotine dependence: Secondary | ICD-10-CM | POA: Diagnosis not present

## 2018-09-29 DIAGNOSIS — Z7901 Long term (current) use of anticoagulants: Secondary | ICD-10-CM | POA: Diagnosis not present

## 2018-09-29 DIAGNOSIS — F329 Major depressive disorder, single episode, unspecified: Secondary | ICD-10-CM | POA: Diagnosis not present

## 2018-09-29 DIAGNOSIS — Z90710 Acquired absence of both cervix and uterus: Secondary | ICD-10-CM | POA: Diagnosis not present

## 2018-09-29 DIAGNOSIS — Z800 Family history of malignant neoplasm of digestive organs: Secondary | ICD-10-CM | POA: Diagnosis not present

## 2018-09-29 DIAGNOSIS — C23 Malignant neoplasm of gallbladder: Secondary | ICD-10-CM | POA: Diagnosis not present

## 2018-09-29 DIAGNOSIS — G629 Polyneuropathy, unspecified: Secondary | ICD-10-CM | POA: Diagnosis not present

## 2018-09-29 DIAGNOSIS — D6481 Anemia due to antineoplastic chemotherapy: Secondary | ICD-10-CM | POA: Diagnosis not present

## 2018-10-20 DIAGNOSIS — Z882 Allergy status to sulfonamides status: Secondary | ICD-10-CM | POA: Diagnosis not present

## 2018-10-20 DIAGNOSIS — C787 Secondary malignant neoplasm of liver and intrahepatic bile duct: Secondary | ICD-10-CM | POA: Diagnosis not present

## 2018-10-20 DIAGNOSIS — C23 Malignant neoplasm of gallbladder: Secondary | ICD-10-CM | POA: Diagnosis not present

## 2018-10-20 DIAGNOSIS — Z90710 Acquired absence of both cervix and uterus: Secondary | ICD-10-CM | POA: Diagnosis not present

## 2018-10-20 DIAGNOSIS — D6481 Anemia due to antineoplastic chemotherapy: Secondary | ICD-10-CM | POA: Diagnosis not present

## 2018-10-20 DIAGNOSIS — C221 Intrahepatic bile duct carcinoma: Secondary | ICD-10-CM | POA: Diagnosis not present

## 2018-10-20 DIAGNOSIS — Z800 Family history of malignant neoplasm of digestive organs: Secondary | ICD-10-CM | POA: Diagnosis not present

## 2018-10-20 DIAGNOSIS — G629 Polyneuropathy, unspecified: Secondary | ICD-10-CM | POA: Diagnosis not present

## 2018-10-20 DIAGNOSIS — Z7901 Long term (current) use of anticoagulants: Secondary | ICD-10-CM | POA: Diagnosis not present

## 2018-10-20 DIAGNOSIS — Z885 Allergy status to narcotic agent status: Secondary | ICD-10-CM | POA: Diagnosis not present

## 2018-10-20 DIAGNOSIS — Z87891 Personal history of nicotine dependence: Secondary | ICD-10-CM | POA: Diagnosis not present

## 2018-10-20 DIAGNOSIS — F329 Major depressive disorder, single episode, unspecified: Secondary | ICD-10-CM | POA: Diagnosis not present

## 2018-10-20 DIAGNOSIS — Z5111 Encounter for antineoplastic chemotherapy: Secondary | ICD-10-CM | POA: Diagnosis not present

## 2018-10-20 DIAGNOSIS — Z86718 Personal history of other venous thrombosis and embolism: Secondary | ICD-10-CM | POA: Diagnosis not present

## 2018-10-20 DIAGNOSIS — Z79899 Other long term (current) drug therapy: Secondary | ICD-10-CM | POA: Diagnosis not present

## 2018-11-03 DIAGNOSIS — C240 Malignant neoplasm of extrahepatic bile duct: Secondary | ICD-10-CM | POA: Diagnosis not present

## 2018-11-03 DIAGNOSIS — N281 Cyst of kidney, acquired: Secondary | ICD-10-CM | POA: Diagnosis not present

## 2018-11-03 DIAGNOSIS — C787 Secondary malignant neoplasm of liver and intrahepatic bile duct: Secondary | ICD-10-CM | POA: Diagnosis not present

## 2018-11-03 DIAGNOSIS — R911 Solitary pulmonary nodule: Secondary | ICD-10-CM | POA: Diagnosis not present

## 2018-11-10 DIAGNOSIS — Z86718 Personal history of other venous thrombosis and embolism: Secondary | ICD-10-CM | POA: Diagnosis not present

## 2018-11-10 DIAGNOSIS — Z87891 Personal history of nicotine dependence: Secondary | ICD-10-CM | POA: Diagnosis not present

## 2018-11-10 DIAGNOSIS — C221 Intrahepatic bile duct carcinoma: Secondary | ICD-10-CM | POA: Diagnosis not present

## 2018-11-10 DIAGNOSIS — F329 Major depressive disorder, single episode, unspecified: Secondary | ICD-10-CM | POA: Diagnosis not present

## 2018-11-10 DIAGNOSIS — G629 Polyneuropathy, unspecified: Secondary | ICD-10-CM | POA: Diagnosis not present

## 2018-11-10 DIAGNOSIS — Z882 Allergy status to sulfonamides status: Secondary | ICD-10-CM | POA: Diagnosis not present

## 2018-11-10 DIAGNOSIS — C23 Malignant neoplasm of gallbladder: Secondary | ICD-10-CM | POA: Diagnosis not present

## 2018-11-10 DIAGNOSIS — Z5111 Encounter for antineoplastic chemotherapy: Secondary | ICD-10-CM | POA: Diagnosis not present

## 2018-11-10 DIAGNOSIS — Z885 Allergy status to narcotic agent status: Secondary | ICD-10-CM | POA: Diagnosis not present

## 2018-11-10 DIAGNOSIS — Z79899 Other long term (current) drug therapy: Secondary | ICD-10-CM | POA: Diagnosis not present

## 2018-11-10 DIAGNOSIS — D6481 Anemia due to antineoplastic chemotherapy: Secondary | ICD-10-CM | POA: Diagnosis not present

## 2018-11-10 DIAGNOSIS — Z7901 Long term (current) use of anticoagulants: Secondary | ICD-10-CM | POA: Diagnosis not present

## 2018-11-10 DIAGNOSIS — Z800 Family history of malignant neoplasm of digestive organs: Secondary | ICD-10-CM | POA: Diagnosis not present

## 2018-11-10 DIAGNOSIS — Z90710 Acquired absence of both cervix and uterus: Secondary | ICD-10-CM | POA: Diagnosis not present

## 2018-11-10 DIAGNOSIS — C787 Secondary malignant neoplasm of liver and intrahepatic bile duct: Secondary | ICD-10-CM | POA: Diagnosis not present

## 2018-12-01 DIAGNOSIS — Z885 Allergy status to narcotic agent status: Secondary | ICD-10-CM | POA: Diagnosis not present

## 2018-12-01 DIAGNOSIS — F329 Major depressive disorder, single episode, unspecified: Secondary | ICD-10-CM | POA: Diagnosis not present

## 2018-12-01 DIAGNOSIS — Z86718 Personal history of other venous thrombosis and embolism: Secondary | ICD-10-CM | POA: Diagnosis not present

## 2018-12-01 DIAGNOSIS — D6481 Anemia due to antineoplastic chemotherapy: Secondary | ICD-10-CM | POA: Diagnosis not present

## 2018-12-01 DIAGNOSIS — C23 Malignant neoplasm of gallbladder: Secondary | ICD-10-CM | POA: Diagnosis not present

## 2018-12-01 DIAGNOSIS — Z800 Family history of malignant neoplasm of digestive organs: Secondary | ICD-10-CM | POA: Diagnosis not present

## 2018-12-01 DIAGNOSIS — Z882 Allergy status to sulfonamides status: Secondary | ICD-10-CM | POA: Diagnosis not present

## 2018-12-01 DIAGNOSIS — Z87891 Personal history of nicotine dependence: Secondary | ICD-10-CM | POA: Diagnosis not present

## 2018-12-01 DIAGNOSIS — Z5111 Encounter for antineoplastic chemotherapy: Secondary | ICD-10-CM | POA: Diagnosis not present

## 2018-12-01 DIAGNOSIS — Z7901 Long term (current) use of anticoagulants: Secondary | ICD-10-CM | POA: Diagnosis not present

## 2018-12-01 DIAGNOSIS — Z90710 Acquired absence of both cervix and uterus: Secondary | ICD-10-CM | POA: Diagnosis not present

## 2018-12-01 DIAGNOSIS — C787 Secondary malignant neoplasm of liver and intrahepatic bile duct: Secondary | ICD-10-CM | POA: Diagnosis not present

## 2018-12-01 DIAGNOSIS — C221 Intrahepatic bile duct carcinoma: Secondary | ICD-10-CM | POA: Diagnosis not present

## 2018-12-01 DIAGNOSIS — G629 Polyneuropathy, unspecified: Secondary | ICD-10-CM | POA: Diagnosis not present

## 2018-12-01 DIAGNOSIS — Z79899 Other long term (current) drug therapy: Secondary | ICD-10-CM | POA: Diagnosis not present

## 2018-12-22 DIAGNOSIS — Z5111 Encounter for antineoplastic chemotherapy: Secondary | ICD-10-CM | POA: Diagnosis not present

## 2018-12-22 DIAGNOSIS — Z87891 Personal history of nicotine dependence: Secondary | ICD-10-CM | POA: Diagnosis not present

## 2018-12-22 DIAGNOSIS — G629 Polyneuropathy, unspecified: Secondary | ICD-10-CM | POA: Diagnosis not present

## 2018-12-22 DIAGNOSIS — C23 Malignant neoplasm of gallbladder: Secondary | ICD-10-CM | POA: Diagnosis not present

## 2018-12-22 DIAGNOSIS — C221 Intrahepatic bile duct carcinoma: Secondary | ICD-10-CM | POA: Diagnosis not present

## 2018-12-22 DIAGNOSIS — Z7901 Long term (current) use of anticoagulants: Secondary | ICD-10-CM | POA: Diagnosis not present

## 2018-12-22 DIAGNOSIS — F329 Major depressive disorder, single episode, unspecified: Secondary | ICD-10-CM | POA: Diagnosis not present

## 2018-12-22 DIAGNOSIS — Z90710 Acquired absence of both cervix and uterus: Secondary | ICD-10-CM | POA: Diagnosis not present

## 2018-12-22 DIAGNOSIS — D6481 Anemia due to antineoplastic chemotherapy: Secondary | ICD-10-CM | POA: Diagnosis not present

## 2018-12-22 DIAGNOSIS — Z86718 Personal history of other venous thrombosis and embolism: Secondary | ICD-10-CM | POA: Diagnosis not present

## 2018-12-22 DIAGNOSIS — Z882 Allergy status to sulfonamides status: Secondary | ICD-10-CM | POA: Diagnosis not present

## 2018-12-22 DIAGNOSIS — Z885 Allergy status to narcotic agent status: Secondary | ICD-10-CM | POA: Diagnosis not present

## 2018-12-22 DIAGNOSIS — Z79899 Other long term (current) drug therapy: Secondary | ICD-10-CM | POA: Diagnosis not present

## 2018-12-22 DIAGNOSIS — Z800 Family history of malignant neoplasm of digestive organs: Secondary | ICD-10-CM | POA: Diagnosis not present

## 2018-12-22 DIAGNOSIS — C787 Secondary malignant neoplasm of liver and intrahepatic bile duct: Secondary | ICD-10-CM | POA: Diagnosis not present

## 2019-01-12 DIAGNOSIS — G629 Polyneuropathy, unspecified: Secondary | ICD-10-CM | POA: Diagnosis not present

## 2019-01-12 DIAGNOSIS — D6481 Anemia due to antineoplastic chemotherapy: Secondary | ICD-10-CM | POA: Diagnosis not present

## 2019-01-12 DIAGNOSIS — Z79899 Other long term (current) drug therapy: Secondary | ICD-10-CM | POA: Diagnosis not present

## 2019-01-12 DIAGNOSIS — Z90710 Acquired absence of both cervix and uterus: Secondary | ICD-10-CM | POA: Diagnosis not present

## 2019-01-12 DIAGNOSIS — C787 Secondary malignant neoplasm of liver and intrahepatic bile duct: Secondary | ICD-10-CM | POA: Diagnosis not present

## 2019-01-12 DIAGNOSIS — Z7901 Long term (current) use of anticoagulants: Secondary | ICD-10-CM | POA: Diagnosis not present

## 2019-01-12 DIAGNOSIS — Z885 Allergy status to narcotic agent status: Secondary | ICD-10-CM | POA: Diagnosis not present

## 2019-01-12 DIAGNOSIS — Z5111 Encounter for antineoplastic chemotherapy: Secondary | ICD-10-CM | POA: Diagnosis not present

## 2019-01-12 DIAGNOSIS — C221 Intrahepatic bile duct carcinoma: Secondary | ICD-10-CM | POA: Diagnosis not present

## 2019-01-12 DIAGNOSIS — Z800 Family history of malignant neoplasm of digestive organs: Secondary | ICD-10-CM | POA: Diagnosis not present

## 2019-01-12 DIAGNOSIS — C23 Malignant neoplasm of gallbladder: Secondary | ICD-10-CM | POA: Diagnosis not present

## 2019-01-12 DIAGNOSIS — F329 Major depressive disorder, single episode, unspecified: Secondary | ICD-10-CM | POA: Diagnosis not present

## 2019-01-12 DIAGNOSIS — Z86718 Personal history of other venous thrombosis and embolism: Secondary | ICD-10-CM | POA: Diagnosis not present

## 2019-01-12 DIAGNOSIS — Z87891 Personal history of nicotine dependence: Secondary | ICD-10-CM | POA: Diagnosis not present

## 2019-01-12 DIAGNOSIS — Z882 Allergy status to sulfonamides status: Secondary | ICD-10-CM | POA: Diagnosis not present

## 2019-01-25 DIAGNOSIS — C23 Malignant neoplasm of gallbladder: Secondary | ICD-10-CM | POA: Diagnosis not present

## 2019-01-25 DIAGNOSIS — N281 Cyst of kidney, acquired: Secondary | ICD-10-CM | POA: Diagnosis not present

## 2019-01-25 DIAGNOSIS — D3500 Benign neoplasm of unspecified adrenal gland: Secondary | ICD-10-CM | POA: Diagnosis not present

## 2019-01-25 DIAGNOSIS — R918 Other nonspecific abnormal finding of lung field: Secondary | ICD-10-CM | POA: Diagnosis not present

## 2019-02-02 DIAGNOSIS — Z800 Family history of malignant neoplasm of digestive organs: Secondary | ICD-10-CM | POA: Diagnosis not present

## 2019-02-02 DIAGNOSIS — Z7901 Long term (current) use of anticoagulants: Secondary | ICD-10-CM | POA: Diagnosis not present

## 2019-02-02 DIAGNOSIS — D6481 Anemia due to antineoplastic chemotherapy: Secondary | ICD-10-CM | POA: Diagnosis not present

## 2019-02-02 DIAGNOSIS — C221 Intrahepatic bile duct carcinoma: Secondary | ICD-10-CM | POA: Diagnosis not present

## 2019-02-02 DIAGNOSIS — Z885 Allergy status to narcotic agent status: Secondary | ICD-10-CM | POA: Diagnosis not present

## 2019-02-02 DIAGNOSIS — G629 Polyneuropathy, unspecified: Secondary | ICD-10-CM | POA: Diagnosis not present

## 2019-02-02 DIAGNOSIS — F329 Major depressive disorder, single episode, unspecified: Secondary | ICD-10-CM | POA: Diagnosis not present

## 2019-02-02 DIAGNOSIS — C787 Secondary malignant neoplasm of liver and intrahepatic bile duct: Secondary | ICD-10-CM | POA: Diagnosis not present

## 2019-02-02 DIAGNOSIS — Z79899 Other long term (current) drug therapy: Secondary | ICD-10-CM | POA: Diagnosis not present

## 2019-02-02 DIAGNOSIS — C23 Malignant neoplasm of gallbladder: Secondary | ICD-10-CM | POA: Diagnosis not present

## 2019-02-02 DIAGNOSIS — Z87891 Personal history of nicotine dependence: Secondary | ICD-10-CM | POA: Diagnosis not present

## 2019-02-02 DIAGNOSIS — Z5111 Encounter for antineoplastic chemotherapy: Secondary | ICD-10-CM | POA: Diagnosis not present

## 2019-02-02 DIAGNOSIS — Z86718 Personal history of other venous thrombosis and embolism: Secondary | ICD-10-CM | POA: Diagnosis not present

## 2019-02-02 DIAGNOSIS — Z882 Allergy status to sulfonamides status: Secondary | ICD-10-CM | POA: Diagnosis not present

## 2019-02-02 DIAGNOSIS — Z90710 Acquired absence of both cervix and uterus: Secondary | ICD-10-CM | POA: Diagnosis not present

## 2019-02-23 DIAGNOSIS — Z87891 Personal history of nicotine dependence: Secondary | ICD-10-CM | POA: Diagnosis not present

## 2019-02-23 DIAGNOSIS — Z5111 Encounter for antineoplastic chemotherapy: Secondary | ICD-10-CM | POA: Diagnosis not present

## 2019-02-23 DIAGNOSIS — Z800 Family history of malignant neoplasm of digestive organs: Secondary | ICD-10-CM | POA: Diagnosis not present

## 2019-02-23 DIAGNOSIS — Z86718 Personal history of other venous thrombosis and embolism: Secondary | ICD-10-CM | POA: Diagnosis not present

## 2019-02-23 DIAGNOSIS — Z7901 Long term (current) use of anticoagulants: Secondary | ICD-10-CM | POA: Diagnosis not present

## 2019-02-23 DIAGNOSIS — Z79899 Other long term (current) drug therapy: Secondary | ICD-10-CM | POA: Diagnosis not present

## 2019-02-23 DIAGNOSIS — Z90710 Acquired absence of both cervix and uterus: Secondary | ICD-10-CM | POA: Diagnosis not present

## 2019-02-23 DIAGNOSIS — F329 Major depressive disorder, single episode, unspecified: Secondary | ICD-10-CM | POA: Diagnosis not present

## 2019-02-23 DIAGNOSIS — D6481 Anemia due to antineoplastic chemotherapy: Secondary | ICD-10-CM | POA: Diagnosis not present

## 2019-02-23 DIAGNOSIS — C221 Intrahepatic bile duct carcinoma: Secondary | ICD-10-CM | POA: Diagnosis not present

## 2019-02-23 DIAGNOSIS — Z885 Allergy status to narcotic agent status: Secondary | ICD-10-CM | POA: Diagnosis not present

## 2019-02-23 DIAGNOSIS — C787 Secondary malignant neoplasm of liver and intrahepatic bile duct: Secondary | ICD-10-CM | POA: Diagnosis not present

## 2019-02-23 DIAGNOSIS — G629 Polyneuropathy, unspecified: Secondary | ICD-10-CM | POA: Diagnosis not present

## 2019-02-23 DIAGNOSIS — C23 Malignant neoplasm of gallbladder: Secondary | ICD-10-CM | POA: Diagnosis not present

## 2019-02-23 DIAGNOSIS — Z882 Allergy status to sulfonamides status: Secondary | ICD-10-CM | POA: Diagnosis not present

## 2019-03-02 DIAGNOSIS — Z882 Allergy status to sulfonamides status: Secondary | ICD-10-CM | POA: Diagnosis not present

## 2019-03-02 DIAGNOSIS — D6481 Anemia due to antineoplastic chemotherapy: Secondary | ICD-10-CM | POA: Diagnosis not present

## 2019-03-02 DIAGNOSIS — Z7901 Long term (current) use of anticoagulants: Secondary | ICD-10-CM | POA: Diagnosis not present

## 2019-03-02 DIAGNOSIS — C23 Malignant neoplasm of gallbladder: Secondary | ICD-10-CM | POA: Diagnosis not present

## 2019-03-02 DIAGNOSIS — Z87891 Personal history of nicotine dependence: Secondary | ICD-10-CM | POA: Diagnosis not present

## 2019-03-02 DIAGNOSIS — Z90710 Acquired absence of both cervix and uterus: Secondary | ICD-10-CM | POA: Diagnosis not present

## 2019-03-02 DIAGNOSIS — Z885 Allergy status to narcotic agent status: Secondary | ICD-10-CM | POA: Diagnosis not present

## 2019-03-02 DIAGNOSIS — Z86718 Personal history of other venous thrombosis and embolism: Secondary | ICD-10-CM | POA: Diagnosis not present

## 2019-03-02 DIAGNOSIS — Z79899 Other long term (current) drug therapy: Secondary | ICD-10-CM | POA: Diagnosis not present

## 2019-03-02 DIAGNOSIS — Z800 Family history of malignant neoplasm of digestive organs: Secondary | ICD-10-CM | POA: Diagnosis not present

## 2019-03-02 DIAGNOSIS — F329 Major depressive disorder, single episode, unspecified: Secondary | ICD-10-CM | POA: Diagnosis not present

## 2019-03-02 DIAGNOSIS — G629 Polyneuropathy, unspecified: Secondary | ICD-10-CM | POA: Diagnosis not present

## 2019-03-02 DIAGNOSIS — Z5111 Encounter for antineoplastic chemotherapy: Secondary | ICD-10-CM | POA: Diagnosis not present

## 2019-03-02 DIAGNOSIS — C221 Intrahepatic bile duct carcinoma: Secondary | ICD-10-CM | POA: Diagnosis not present

## 2019-03-02 DIAGNOSIS — C787 Secondary malignant neoplasm of liver and intrahepatic bile duct: Secondary | ICD-10-CM | POA: Diagnosis not present

## 2019-03-16 DIAGNOSIS — G629 Polyneuropathy, unspecified: Secondary | ICD-10-CM | POA: Diagnosis not present

## 2019-03-16 DIAGNOSIS — Z79899 Other long term (current) drug therapy: Secondary | ICD-10-CM | POA: Diagnosis not present

## 2019-03-16 DIAGNOSIS — Z800 Family history of malignant neoplasm of digestive organs: Secondary | ICD-10-CM | POA: Diagnosis not present

## 2019-03-16 DIAGNOSIS — Z7901 Long term (current) use of anticoagulants: Secondary | ICD-10-CM | POA: Diagnosis not present

## 2019-03-16 DIAGNOSIS — Z90710 Acquired absence of both cervix and uterus: Secondary | ICD-10-CM | POA: Diagnosis not present

## 2019-03-16 DIAGNOSIS — Z882 Allergy status to sulfonamides status: Secondary | ICD-10-CM | POA: Diagnosis not present

## 2019-03-16 DIAGNOSIS — C221 Intrahepatic bile duct carcinoma: Secondary | ICD-10-CM | POA: Diagnosis not present

## 2019-03-16 DIAGNOSIS — Z5111 Encounter for antineoplastic chemotherapy: Secondary | ICD-10-CM | POA: Diagnosis not present

## 2019-03-16 DIAGNOSIS — Z87891 Personal history of nicotine dependence: Secondary | ICD-10-CM | POA: Diagnosis not present

## 2019-03-16 DIAGNOSIS — F329 Major depressive disorder, single episode, unspecified: Secondary | ICD-10-CM | POA: Diagnosis not present

## 2019-03-16 DIAGNOSIS — Z86718 Personal history of other venous thrombosis and embolism: Secondary | ICD-10-CM | POA: Diagnosis not present

## 2019-03-16 DIAGNOSIS — C787 Secondary malignant neoplasm of liver and intrahepatic bile duct: Secondary | ICD-10-CM | POA: Diagnosis not present

## 2019-03-16 DIAGNOSIS — Z885 Allergy status to narcotic agent status: Secondary | ICD-10-CM | POA: Diagnosis not present

## 2019-03-16 DIAGNOSIS — C23 Malignant neoplasm of gallbladder: Secondary | ICD-10-CM | POA: Diagnosis not present

## 2019-03-16 DIAGNOSIS — D6481 Anemia due to antineoplastic chemotherapy: Secondary | ICD-10-CM | POA: Diagnosis not present

## 2019-04-06 DIAGNOSIS — C221 Intrahepatic bile duct carcinoma: Secondary | ICD-10-CM | POA: Diagnosis not present

## 2019-04-06 DIAGNOSIS — C787 Secondary malignant neoplasm of liver and intrahepatic bile duct: Secondary | ICD-10-CM | POA: Diagnosis not present

## 2019-04-06 DIAGNOSIS — D6481 Anemia due to antineoplastic chemotherapy: Secondary | ICD-10-CM | POA: Diagnosis not present

## 2019-04-06 DIAGNOSIS — Z882 Allergy status to sulfonamides status: Secondary | ICD-10-CM | POA: Diagnosis not present

## 2019-04-06 DIAGNOSIS — C23 Malignant neoplasm of gallbladder: Secondary | ICD-10-CM | POA: Diagnosis not present

## 2019-04-06 DIAGNOSIS — Z86718 Personal history of other venous thrombosis and embolism: Secondary | ICD-10-CM | POA: Diagnosis not present

## 2019-04-06 DIAGNOSIS — Z79899 Other long term (current) drug therapy: Secondary | ICD-10-CM | POA: Diagnosis not present

## 2019-04-06 DIAGNOSIS — Z90710 Acquired absence of both cervix and uterus: Secondary | ICD-10-CM | POA: Diagnosis not present

## 2019-04-06 DIAGNOSIS — Z800 Family history of malignant neoplasm of digestive organs: Secondary | ICD-10-CM | POA: Diagnosis not present

## 2019-04-06 DIAGNOSIS — Z885 Allergy status to narcotic agent status: Secondary | ICD-10-CM | POA: Diagnosis not present

## 2019-04-06 DIAGNOSIS — F329 Major depressive disorder, single episode, unspecified: Secondary | ICD-10-CM | POA: Diagnosis not present

## 2019-04-06 DIAGNOSIS — Z23 Encounter for immunization: Secondary | ICD-10-CM | POA: Diagnosis not present

## 2019-04-06 DIAGNOSIS — Z87891 Personal history of nicotine dependence: Secondary | ICD-10-CM | POA: Diagnosis not present

## 2019-04-06 DIAGNOSIS — G629 Polyneuropathy, unspecified: Secondary | ICD-10-CM | POA: Diagnosis not present

## 2019-04-06 DIAGNOSIS — Z5111 Encounter for antineoplastic chemotherapy: Secondary | ICD-10-CM | POA: Diagnosis not present

## 2019-04-06 DIAGNOSIS — Z7901 Long term (current) use of anticoagulants: Secondary | ICD-10-CM | POA: Diagnosis not present

## 2019-04-27 DIAGNOSIS — Z885 Allergy status to narcotic agent status: Secondary | ICD-10-CM | POA: Diagnosis not present

## 2019-04-27 DIAGNOSIS — C23 Malignant neoplasm of gallbladder: Secondary | ICD-10-CM | POA: Diagnosis not present

## 2019-04-27 DIAGNOSIS — Z79899 Other long term (current) drug therapy: Secondary | ICD-10-CM | POA: Diagnosis not present

## 2019-04-27 DIAGNOSIS — Z86718 Personal history of other venous thrombosis and embolism: Secondary | ICD-10-CM | POA: Diagnosis not present

## 2019-04-27 DIAGNOSIS — Z7901 Long term (current) use of anticoagulants: Secondary | ICD-10-CM | POA: Diagnosis not present

## 2019-04-27 DIAGNOSIS — G629 Polyneuropathy, unspecified: Secondary | ICD-10-CM | POA: Diagnosis not present

## 2019-04-27 DIAGNOSIS — D6481 Anemia due to antineoplastic chemotherapy: Secondary | ICD-10-CM | POA: Diagnosis not present

## 2019-04-27 DIAGNOSIS — Z800 Family history of malignant neoplasm of digestive organs: Secondary | ICD-10-CM | POA: Diagnosis not present

## 2019-04-27 DIAGNOSIS — Z90710 Acquired absence of both cervix and uterus: Secondary | ICD-10-CM | POA: Diagnosis not present

## 2019-04-27 DIAGNOSIS — F329 Major depressive disorder, single episode, unspecified: Secondary | ICD-10-CM | POA: Diagnosis not present

## 2019-04-27 DIAGNOSIS — C787 Secondary malignant neoplasm of liver and intrahepatic bile duct: Secondary | ICD-10-CM | POA: Diagnosis not present

## 2019-04-27 DIAGNOSIS — Z23 Encounter for immunization: Secondary | ICD-10-CM | POA: Diagnosis not present

## 2019-04-27 DIAGNOSIS — Z87891 Personal history of nicotine dependence: Secondary | ICD-10-CM | POA: Diagnosis not present

## 2019-04-27 DIAGNOSIS — Z5111 Encounter for antineoplastic chemotherapy: Secondary | ICD-10-CM | POA: Diagnosis not present

## 2019-04-27 DIAGNOSIS — C221 Intrahepatic bile duct carcinoma: Secondary | ICD-10-CM | POA: Diagnosis not present

## 2019-04-27 DIAGNOSIS — Z882 Allergy status to sulfonamides status: Secondary | ICD-10-CM | POA: Diagnosis not present

## 2019-05-04 DIAGNOSIS — Z87891 Personal history of nicotine dependence: Secondary | ICD-10-CM | POA: Diagnosis not present

## 2019-05-04 DIAGNOSIS — G629 Polyneuropathy, unspecified: Secondary | ICD-10-CM | POA: Diagnosis not present

## 2019-05-04 DIAGNOSIS — C23 Malignant neoplasm of gallbladder: Secondary | ICD-10-CM | POA: Diagnosis not present

## 2019-05-04 DIAGNOSIS — Z882 Allergy status to sulfonamides status: Secondary | ICD-10-CM | POA: Diagnosis not present

## 2019-05-04 DIAGNOSIS — Z800 Family history of malignant neoplasm of digestive organs: Secondary | ICD-10-CM | POA: Diagnosis not present

## 2019-05-04 DIAGNOSIS — Z79899 Other long term (current) drug therapy: Secondary | ICD-10-CM | POA: Diagnosis not present

## 2019-05-04 DIAGNOSIS — Z7901 Long term (current) use of anticoagulants: Secondary | ICD-10-CM | POA: Diagnosis not present

## 2019-05-04 DIAGNOSIS — Z23 Encounter for immunization: Secondary | ICD-10-CM | POA: Diagnosis not present

## 2019-05-04 DIAGNOSIS — Z90710 Acquired absence of both cervix and uterus: Secondary | ICD-10-CM | POA: Diagnosis not present

## 2019-05-04 DIAGNOSIS — D6481 Anemia due to antineoplastic chemotherapy: Secondary | ICD-10-CM | POA: Diagnosis not present

## 2019-05-04 DIAGNOSIS — Z885 Allergy status to narcotic agent status: Secondary | ICD-10-CM | POA: Diagnosis not present

## 2019-05-04 DIAGNOSIS — F329 Major depressive disorder, single episode, unspecified: Secondary | ICD-10-CM | POA: Diagnosis not present

## 2019-05-04 DIAGNOSIS — Z5111 Encounter for antineoplastic chemotherapy: Secondary | ICD-10-CM | POA: Diagnosis not present

## 2019-05-04 DIAGNOSIS — Z86718 Personal history of other venous thrombosis and embolism: Secondary | ICD-10-CM | POA: Diagnosis not present

## 2019-05-04 DIAGNOSIS — C787 Secondary malignant neoplasm of liver and intrahepatic bile duct: Secondary | ICD-10-CM | POA: Diagnosis not present

## 2019-05-18 DIAGNOSIS — Z882 Allergy status to sulfonamides status: Secondary | ICD-10-CM | POA: Diagnosis not present

## 2019-05-18 DIAGNOSIS — Z90710 Acquired absence of both cervix and uterus: Secondary | ICD-10-CM | POA: Diagnosis not present

## 2019-05-18 DIAGNOSIS — G629 Polyneuropathy, unspecified: Secondary | ICD-10-CM | POA: Diagnosis not present

## 2019-05-18 DIAGNOSIS — C787 Secondary malignant neoplasm of liver and intrahepatic bile duct: Secondary | ICD-10-CM | POA: Diagnosis not present

## 2019-05-18 DIAGNOSIS — Z5111 Encounter for antineoplastic chemotherapy: Secondary | ICD-10-CM | POA: Diagnosis not present

## 2019-05-18 DIAGNOSIS — Z87891 Personal history of nicotine dependence: Secondary | ICD-10-CM | POA: Diagnosis not present

## 2019-05-18 DIAGNOSIS — F329 Major depressive disorder, single episode, unspecified: Secondary | ICD-10-CM | POA: Diagnosis not present

## 2019-05-18 DIAGNOSIS — Z86718 Personal history of other venous thrombosis and embolism: Secondary | ICD-10-CM | POA: Diagnosis not present

## 2019-05-18 DIAGNOSIS — D6481 Anemia due to antineoplastic chemotherapy: Secondary | ICD-10-CM | POA: Diagnosis not present

## 2019-05-18 DIAGNOSIS — Z7901 Long term (current) use of anticoagulants: Secondary | ICD-10-CM | POA: Diagnosis not present

## 2019-05-18 DIAGNOSIS — Z885 Allergy status to narcotic agent status: Secondary | ICD-10-CM | POA: Diagnosis not present

## 2019-05-18 DIAGNOSIS — C23 Malignant neoplasm of gallbladder: Secondary | ICD-10-CM | POA: Diagnosis not present

## 2019-05-18 DIAGNOSIS — Z79899 Other long term (current) drug therapy: Secondary | ICD-10-CM | POA: Diagnosis not present

## 2019-05-18 DIAGNOSIS — Z800 Family history of malignant neoplasm of digestive organs: Secondary | ICD-10-CM | POA: Diagnosis not present

## 2019-06-07 DIAGNOSIS — R911 Solitary pulmonary nodule: Secondary | ICD-10-CM | POA: Diagnosis not present

## 2019-06-07 DIAGNOSIS — C221 Intrahepatic bile duct carcinoma: Secondary | ICD-10-CM | POA: Diagnosis not present

## 2019-06-07 DIAGNOSIS — Q446 Cystic disease of liver: Secondary | ICD-10-CM | POA: Diagnosis not present

## 2019-06-08 DIAGNOSIS — Z87891 Personal history of nicotine dependence: Secondary | ICD-10-CM | POA: Diagnosis not present

## 2019-06-08 DIAGNOSIS — C23 Malignant neoplasm of gallbladder: Secondary | ICD-10-CM | POA: Diagnosis not present

## 2019-06-08 DIAGNOSIS — Z885 Allergy status to narcotic agent status: Secondary | ICD-10-CM | POA: Diagnosis not present

## 2019-06-08 DIAGNOSIS — Z90710 Acquired absence of both cervix and uterus: Secondary | ICD-10-CM | POA: Diagnosis not present

## 2019-06-08 DIAGNOSIS — G629 Polyneuropathy, unspecified: Secondary | ICD-10-CM | POA: Diagnosis not present

## 2019-06-08 DIAGNOSIS — D6481 Anemia due to antineoplastic chemotherapy: Secondary | ICD-10-CM | POA: Diagnosis not present

## 2019-06-08 DIAGNOSIS — Z5111 Encounter for antineoplastic chemotherapy: Secondary | ICD-10-CM | POA: Diagnosis not present

## 2019-06-08 DIAGNOSIS — C787 Secondary malignant neoplasm of liver and intrahepatic bile duct: Secondary | ICD-10-CM | POA: Diagnosis not present

## 2019-06-08 DIAGNOSIS — Z79899 Other long term (current) drug therapy: Secondary | ICD-10-CM | POA: Diagnosis not present

## 2019-06-08 DIAGNOSIS — Z7901 Long term (current) use of anticoagulants: Secondary | ICD-10-CM | POA: Diagnosis not present

## 2019-06-08 DIAGNOSIS — C221 Intrahepatic bile duct carcinoma: Secondary | ICD-10-CM | POA: Diagnosis not present

## 2019-06-08 DIAGNOSIS — Z882 Allergy status to sulfonamides status: Secondary | ICD-10-CM | POA: Diagnosis not present

## 2019-06-08 DIAGNOSIS — Z800 Family history of malignant neoplasm of digestive organs: Secondary | ICD-10-CM | POA: Diagnosis not present

## 2019-06-08 DIAGNOSIS — Z86718 Personal history of other venous thrombosis and embolism: Secondary | ICD-10-CM | POA: Diagnosis not present

## 2019-06-08 DIAGNOSIS — F329 Major depressive disorder, single episode, unspecified: Secondary | ICD-10-CM | POA: Diagnosis not present

## 2019-07-03 DIAGNOSIS — C23 Malignant neoplasm of gallbladder: Secondary | ICD-10-CM | POA: Diagnosis not present

## 2019-07-03 DIAGNOSIS — F329 Major depressive disorder, single episode, unspecified: Secondary | ICD-10-CM | POA: Diagnosis not present

## 2019-07-03 DIAGNOSIS — Z86718 Personal history of other venous thrombosis and embolism: Secondary | ICD-10-CM | POA: Diagnosis not present

## 2019-07-03 DIAGNOSIS — D6481 Anemia due to antineoplastic chemotherapy: Secondary | ICD-10-CM | POA: Diagnosis not present

## 2019-07-03 DIAGNOSIS — C787 Secondary malignant neoplasm of liver and intrahepatic bile duct: Secondary | ICD-10-CM | POA: Diagnosis not present

## 2019-07-03 DIAGNOSIS — Z885 Allergy status to narcotic agent status: Secondary | ICD-10-CM | POA: Diagnosis not present

## 2019-07-03 DIAGNOSIS — Z800 Family history of malignant neoplasm of digestive organs: Secondary | ICD-10-CM | POA: Diagnosis not present

## 2019-07-03 DIAGNOSIS — Z79899 Other long term (current) drug therapy: Secondary | ICD-10-CM | POA: Diagnosis not present

## 2019-07-03 DIAGNOSIS — Z882 Allergy status to sulfonamides status: Secondary | ICD-10-CM | POA: Diagnosis not present

## 2019-07-03 DIAGNOSIS — Z5111 Encounter for antineoplastic chemotherapy: Secondary | ICD-10-CM | POA: Diagnosis not present

## 2019-07-03 DIAGNOSIS — G629 Polyneuropathy, unspecified: Secondary | ICD-10-CM | POA: Diagnosis not present

## 2019-07-03 DIAGNOSIS — Z87891 Personal history of nicotine dependence: Secondary | ICD-10-CM | POA: Diagnosis not present

## 2019-07-03 DIAGNOSIS — Z90710 Acquired absence of both cervix and uterus: Secondary | ICD-10-CM | POA: Diagnosis not present

## 2019-07-03 DIAGNOSIS — Z7901 Long term (current) use of anticoagulants: Secondary | ICD-10-CM | POA: Diagnosis not present

## 2019-07-28 DIAGNOSIS — Z5111 Encounter for antineoplastic chemotherapy: Secondary | ICD-10-CM | POA: Diagnosis not present

## 2019-07-28 DIAGNOSIS — Z90710 Acquired absence of both cervix and uterus: Secondary | ICD-10-CM | POA: Diagnosis not present

## 2019-07-28 DIAGNOSIS — Z836 Family history of other diseases of the respiratory system: Secondary | ICD-10-CM | POA: Diagnosis not present

## 2019-07-28 DIAGNOSIS — Z882 Allergy status to sulfonamides status: Secondary | ICD-10-CM | POA: Diagnosis not present

## 2019-07-28 DIAGNOSIS — Z79899 Other long term (current) drug therapy: Secondary | ICD-10-CM | POA: Diagnosis not present

## 2019-07-28 DIAGNOSIS — Z800 Family history of malignant neoplasm of digestive organs: Secondary | ICD-10-CM | POA: Diagnosis not present

## 2019-07-28 DIAGNOSIS — C23 Malignant neoplasm of gallbladder: Secondary | ICD-10-CM | POA: Diagnosis not present

## 2019-07-28 DIAGNOSIS — D6481 Anemia due to antineoplastic chemotherapy: Secondary | ICD-10-CM | POA: Diagnosis not present

## 2019-07-28 DIAGNOSIS — C221 Intrahepatic bile duct carcinoma: Secondary | ICD-10-CM | POA: Diagnosis not present

## 2019-07-28 DIAGNOSIS — Z87891 Personal history of nicotine dependence: Secondary | ICD-10-CM | POA: Diagnosis not present

## 2019-07-28 DIAGNOSIS — Z885 Allergy status to narcotic agent status: Secondary | ICD-10-CM | POA: Diagnosis not present

## 2019-07-28 DIAGNOSIS — Z86718 Personal history of other venous thrombosis and embolism: Secondary | ICD-10-CM | POA: Diagnosis not present

## 2019-07-28 DIAGNOSIS — Z832 Family history of diseases of the blood and blood-forming organs and certain disorders involving the immune mechanism: Secondary | ICD-10-CM | POA: Diagnosis not present

## 2019-08-21 DIAGNOSIS — Z79899 Other long term (current) drug therapy: Secondary | ICD-10-CM | POA: Diagnosis not present

## 2019-08-21 DIAGNOSIS — C23 Malignant neoplasm of gallbladder: Secondary | ICD-10-CM | POA: Diagnosis not present

## 2019-08-21 DIAGNOSIS — Z836 Family history of other diseases of the respiratory system: Secondary | ICD-10-CM | POA: Diagnosis not present

## 2019-08-21 DIAGNOSIS — C221 Intrahepatic bile duct carcinoma: Secondary | ICD-10-CM | POA: Diagnosis not present

## 2019-08-21 DIAGNOSIS — Z882 Allergy status to sulfonamides status: Secondary | ICD-10-CM | POA: Diagnosis not present

## 2019-08-21 DIAGNOSIS — Z86718 Personal history of other venous thrombosis and embolism: Secondary | ICD-10-CM | POA: Diagnosis not present

## 2019-08-21 DIAGNOSIS — Z832 Family history of diseases of the blood and blood-forming organs and certain disorders involving the immune mechanism: Secondary | ICD-10-CM | POA: Diagnosis not present

## 2019-08-21 DIAGNOSIS — Z87891 Personal history of nicotine dependence: Secondary | ICD-10-CM | POA: Diagnosis not present

## 2019-08-21 DIAGNOSIS — D6481 Anemia due to antineoplastic chemotherapy: Secondary | ICD-10-CM | POA: Diagnosis not present

## 2019-08-21 DIAGNOSIS — Z800 Family history of malignant neoplasm of digestive organs: Secondary | ICD-10-CM | POA: Diagnosis not present

## 2019-08-21 DIAGNOSIS — Z79891 Long term (current) use of opiate analgesic: Secondary | ICD-10-CM | POA: Diagnosis not present

## 2019-08-21 DIAGNOSIS — Z885 Allergy status to narcotic agent status: Secondary | ICD-10-CM | POA: Diagnosis not present

## 2019-08-21 DIAGNOSIS — Z7901 Long term (current) use of anticoagulants: Secondary | ICD-10-CM | POA: Diagnosis not present

## 2019-08-21 DIAGNOSIS — Z90710 Acquired absence of both cervix and uterus: Secondary | ICD-10-CM | POA: Diagnosis not present

## 2019-08-21 DIAGNOSIS — Z5111 Encounter for antineoplastic chemotherapy: Secondary | ICD-10-CM | POA: Diagnosis not present

## 2019-09-11 DIAGNOSIS — Z86718 Personal history of other venous thrombosis and embolism: Secondary | ICD-10-CM | POA: Diagnosis not present

## 2019-09-11 DIAGNOSIS — Z832 Family history of diseases of the blood and blood-forming organs and certain disorders involving the immune mechanism: Secondary | ICD-10-CM | POA: Diagnosis not present

## 2019-09-11 DIAGNOSIS — C23 Malignant neoplasm of gallbladder: Secondary | ICD-10-CM | POA: Diagnosis not present

## 2019-09-11 DIAGNOSIS — Z5111 Encounter for antineoplastic chemotherapy: Secondary | ICD-10-CM | POA: Diagnosis not present

## 2019-09-11 DIAGNOSIS — Z885 Allergy status to narcotic agent status: Secondary | ICD-10-CM | POA: Diagnosis not present

## 2019-09-11 DIAGNOSIS — D6481 Anemia due to antineoplastic chemotherapy: Secondary | ICD-10-CM | POA: Diagnosis not present

## 2019-09-11 DIAGNOSIS — Z882 Allergy status to sulfonamides status: Secondary | ICD-10-CM | POA: Diagnosis not present

## 2019-09-11 DIAGNOSIS — Z79899 Other long term (current) drug therapy: Secondary | ICD-10-CM | POA: Diagnosis not present

## 2019-09-11 DIAGNOSIS — Z836 Family history of other diseases of the respiratory system: Secondary | ICD-10-CM | POA: Diagnosis not present

## 2019-09-11 DIAGNOSIS — Z800 Family history of malignant neoplasm of digestive organs: Secondary | ICD-10-CM | POA: Diagnosis not present

## 2019-09-11 DIAGNOSIS — Z79891 Long term (current) use of opiate analgesic: Secondary | ICD-10-CM | POA: Diagnosis not present

## 2019-09-11 DIAGNOSIS — Z90710 Acquired absence of both cervix and uterus: Secondary | ICD-10-CM | POA: Diagnosis not present

## 2019-09-11 DIAGNOSIS — Z87891 Personal history of nicotine dependence: Secondary | ICD-10-CM | POA: Diagnosis not present

## 2019-09-11 DIAGNOSIS — C221 Intrahepatic bile duct carcinoma: Secondary | ICD-10-CM | POA: Diagnosis not present

## 2019-09-11 DIAGNOSIS — Z7901 Long term (current) use of anticoagulants: Secondary | ICD-10-CM | POA: Diagnosis not present

## 2019-10-11 DIAGNOSIS — C221 Intrahepatic bile duct carcinoma: Secondary | ICD-10-CM | POA: Diagnosis not present

## 2019-10-11 DIAGNOSIS — C23 Malignant neoplasm of gallbladder: Secondary | ICD-10-CM | POA: Diagnosis not present

## 2019-10-13 DIAGNOSIS — C23 Malignant neoplasm of gallbladder: Secondary | ICD-10-CM | POA: Diagnosis not present

## 2019-10-13 DIAGNOSIS — N281 Cyst of kidney, acquired: Secondary | ICD-10-CM | POA: Diagnosis not present

## 2019-10-13 DIAGNOSIS — R911 Solitary pulmonary nodule: Secondary | ICD-10-CM | POA: Diagnosis not present
